# Patient Record
Sex: Female | Born: 1955 | Race: White | Hispanic: No | Smoking: Former smoker
Health system: Southern US, Community
[De-identification: ages and names within clinical notes are randomized; demographics above are authoritative.]

## PROBLEM LIST (undated history)

## (undated) DIAGNOSIS — R569 Unspecified convulsions: Secondary | ICD-10-CM

## (undated) DIAGNOSIS — F329 Major depressive disorder, single episode, unspecified: Secondary | ICD-10-CM

## (undated) DIAGNOSIS — F419 Anxiety disorder, unspecified: Secondary | ICD-10-CM

## (undated) DIAGNOSIS — M329 Systemic lupus erythematosus, unspecified: Secondary | ICD-10-CM

## (undated) DIAGNOSIS — F429 Obsessive-compulsive disorder, unspecified: Secondary | ICD-10-CM

## (undated) DIAGNOSIS — I2510 Atherosclerotic heart disease of native coronary artery without angina pectoris: Secondary | ICD-10-CM

## (undated) DIAGNOSIS — F4310 Post-traumatic stress disorder, unspecified: Secondary | ICD-10-CM

## (undated) DIAGNOSIS — F32A Depression, unspecified: Secondary | ICD-10-CM

## (undated) DIAGNOSIS — B1920 Unspecified viral hepatitis C without hepatic coma: Secondary | ICD-10-CM

## (undated) DIAGNOSIS — IMO0002 Reserved for concepts with insufficient information to code with codable children: Secondary | ICD-10-CM

## (undated) DIAGNOSIS — J45909 Unspecified asthma, uncomplicated: Secondary | ICD-10-CM

## (undated) DIAGNOSIS — F1910 Other psychoactive substance abuse, uncomplicated: Secondary | ICD-10-CM

## (undated) HISTORY — PX: TUBAL LIGATION: SHX77

## (undated) HISTORY — DX: Obsessive-compulsive disorder, unspecified: F42.9

## (undated) HISTORY — DX: Depression, unspecified: F32.A

## (undated) HISTORY — PX: TONSILLECTOMY: SUR1361

## (undated) HISTORY — DX: Anxiety disorder, unspecified: F41.9

## (undated) HISTORY — DX: Post-traumatic stress disorder, unspecified: F43.10

## (undated) HISTORY — PX: CHOLECYSTECTOMY: SHX55

## (undated) HISTORY — DX: Unspecified convulsions: R56.9

## (undated) HISTORY — DX: Major depressive disorder, single episode, unspecified: F32.9

## (undated) HISTORY — PX: OTHER SURGICAL HISTORY: SHX169

## (undated) HISTORY — PX: CARDIAC CATHETERIZATION: SHX172

## (undated) HISTORY — PX: APPENDECTOMY: SHX54

---

## 2001-05-21 ENCOUNTER — Emergency Department (HOSPITAL_COMMUNITY): Admission: EM | Admit: 2001-05-21 | Discharge: 2001-05-21 | Payer: Self-pay | Admitting: Emergency Medicine

## 2001-07-12 ENCOUNTER — Encounter (INDEPENDENT_AMBULATORY_CARE_PROVIDER_SITE_OTHER): Payer: Self-pay | Admitting: Internal Medicine

## 2001-07-12 ENCOUNTER — Ambulatory Visit (HOSPITAL_COMMUNITY): Admission: RE | Admit: 2001-07-12 | Discharge: 2001-07-12 | Payer: Self-pay | Admitting: Internal Medicine

## 2001-08-16 ENCOUNTER — Ambulatory Visit (HOSPITAL_COMMUNITY): Admission: RE | Admit: 2001-08-16 | Discharge: 2001-08-16 | Payer: Self-pay | Admitting: Internal Medicine

## 2001-10-31 ENCOUNTER — Emergency Department (HOSPITAL_COMMUNITY): Admission: EM | Admit: 2001-10-31 | Discharge: 2001-10-31 | Payer: Self-pay | Admitting: *Deleted

## 2001-10-31 ENCOUNTER — Encounter: Payer: Self-pay | Admitting: *Deleted

## 2001-11-25 ENCOUNTER — Ambulatory Visit (HOSPITAL_COMMUNITY): Admission: RE | Admit: 2001-11-25 | Discharge: 2001-11-25 | Payer: Self-pay | Admitting: Unknown Physician Specialty

## 2001-11-25 ENCOUNTER — Encounter: Payer: Self-pay | Admitting: Unknown Physician Specialty

## 2002-02-04 ENCOUNTER — Emergency Department (HOSPITAL_COMMUNITY): Admission: EM | Admit: 2002-02-04 | Discharge: 2002-02-04 | Payer: Self-pay | Admitting: Emergency Medicine

## 2002-03-18 ENCOUNTER — Encounter: Admission: RE | Admit: 2002-03-18 | Discharge: 2002-03-18 | Payer: Self-pay | Admitting: Neurosurgery

## 2002-03-18 ENCOUNTER — Encounter: Payer: Self-pay | Admitting: Neurosurgery

## 2002-04-01 ENCOUNTER — Encounter: Admission: RE | Admit: 2002-04-01 | Discharge: 2002-04-01 | Payer: Self-pay | Admitting: Neurosurgery

## 2002-04-01 ENCOUNTER — Encounter: Payer: Self-pay | Admitting: Neurosurgery

## 2002-04-15 ENCOUNTER — Encounter: Payer: Self-pay | Admitting: Neurosurgery

## 2002-04-15 ENCOUNTER — Encounter: Admission: RE | Admit: 2002-04-15 | Discharge: 2002-04-15 | Payer: Self-pay | Admitting: Neurosurgery

## 2002-04-16 ENCOUNTER — Emergency Department (HOSPITAL_COMMUNITY): Admission: EM | Admit: 2002-04-16 | Discharge: 2002-04-16 | Payer: Self-pay | Admitting: *Deleted

## 2002-06-02 ENCOUNTER — Encounter: Admission: RE | Admit: 2002-06-02 | Discharge: 2002-06-02 | Payer: Self-pay | Admitting: Neurosurgery

## 2002-06-02 ENCOUNTER — Encounter: Payer: Self-pay | Admitting: Neurosurgery

## 2002-06-27 ENCOUNTER — Emergency Department (HOSPITAL_COMMUNITY): Admission: EM | Admit: 2002-06-27 | Discharge: 2002-06-27 | Payer: Self-pay | Admitting: Emergency Medicine

## 2002-07-09 ENCOUNTER — Ambulatory Visit (HOSPITAL_COMMUNITY): Admission: RE | Admit: 2002-07-09 | Discharge: 2002-07-09 | Payer: Self-pay | Admitting: Internal Medicine

## 2002-07-09 ENCOUNTER — Encounter: Payer: Self-pay | Admitting: Internal Medicine

## 2002-07-14 ENCOUNTER — Encounter: Payer: Self-pay | Admitting: Neurosurgery

## 2002-07-16 ENCOUNTER — Ambulatory Visit (HOSPITAL_COMMUNITY): Admission: RE | Admit: 2002-07-16 | Discharge: 2002-07-16 | Payer: Self-pay | Admitting: Neurosurgery

## 2002-08-01 ENCOUNTER — Encounter: Payer: Self-pay | Admitting: Neurosurgery

## 2002-08-01 ENCOUNTER — Inpatient Hospital Stay (HOSPITAL_COMMUNITY): Admission: RE | Admit: 2002-08-01 | Discharge: 2002-08-04 | Payer: Self-pay | Admitting: Neurosurgery

## 2002-12-31 ENCOUNTER — Ambulatory Visit (HOSPITAL_COMMUNITY): Admission: RE | Admit: 2002-12-31 | Discharge: 2002-12-31 | Payer: Self-pay | Admitting: General Surgery

## 2002-12-31 ENCOUNTER — Encounter: Payer: Self-pay | Admitting: General Surgery

## 2003-01-05 ENCOUNTER — Emergency Department (HOSPITAL_COMMUNITY): Admission: EM | Admit: 2003-01-05 | Discharge: 2003-01-05 | Payer: Self-pay | Admitting: Emergency Medicine

## 2003-02-12 ENCOUNTER — Encounter (HOSPITAL_COMMUNITY): Admission: RE | Admit: 2003-02-12 | Discharge: 2003-03-14 | Payer: Self-pay | Admitting: Rheumatology

## 2003-08-23 ENCOUNTER — Emergency Department (HOSPITAL_COMMUNITY): Admission: EM | Admit: 2003-08-23 | Discharge: 2003-08-23 | Payer: Self-pay | Admitting: Emergency Medicine

## 2003-08-25 ENCOUNTER — Emergency Department (HOSPITAL_COMMUNITY): Admission: EM | Admit: 2003-08-25 | Discharge: 2003-08-26 | Payer: Self-pay | Admitting: Emergency Medicine

## 2003-12-23 ENCOUNTER — Ambulatory Visit (HOSPITAL_COMMUNITY): Admission: RE | Admit: 2003-12-23 | Discharge: 2003-12-23 | Payer: Self-pay | Admitting: Family Medicine

## 2003-12-25 ENCOUNTER — Ambulatory Visit (HOSPITAL_COMMUNITY): Admission: RE | Admit: 2003-12-25 | Discharge: 2003-12-25 | Payer: Self-pay | Admitting: Family Medicine

## 2003-12-28 ENCOUNTER — Ambulatory Visit (HOSPITAL_COMMUNITY): Admission: RE | Admit: 2003-12-28 | Discharge: 2003-12-28 | Payer: Self-pay | Admitting: Family Medicine

## 2004-01-19 ENCOUNTER — Ambulatory Visit (HOSPITAL_COMMUNITY): Admission: RE | Admit: 2004-01-19 | Discharge: 2004-01-19 | Payer: Self-pay | Admitting: Family Medicine

## 2004-02-12 ENCOUNTER — Ambulatory Visit (HOSPITAL_COMMUNITY): Admission: RE | Admit: 2004-02-12 | Discharge: 2004-02-12 | Payer: Self-pay | Admitting: Internal Medicine

## 2004-06-17 ENCOUNTER — Ambulatory Visit (HOSPITAL_COMMUNITY): Admission: RE | Admit: 2004-06-17 | Discharge: 2004-06-17 | Payer: Self-pay | Admitting: Internal Medicine

## 2004-07-14 ENCOUNTER — Ambulatory Visit: Payer: Self-pay | Admitting: Gastroenterology

## 2004-08-15 ENCOUNTER — Ambulatory Visit: Payer: Self-pay | Admitting: Family Medicine

## 2004-10-19 ENCOUNTER — Ambulatory Visit: Payer: Self-pay | Admitting: Family Medicine

## 2005-09-18 ENCOUNTER — Ambulatory Visit: Payer: Self-pay | Admitting: Family Medicine

## 2005-09-29 ENCOUNTER — Ambulatory Visit (HOSPITAL_COMMUNITY): Admission: RE | Admit: 2005-09-29 | Discharge: 2005-09-29 | Payer: Self-pay | Admitting: Neurosurgery

## 2005-09-29 ENCOUNTER — Ambulatory Visit (HOSPITAL_COMMUNITY): Admission: RE | Admit: 2005-09-29 | Discharge: 2005-09-29 | Payer: Self-pay | Admitting: Family Medicine

## 2007-09-12 ENCOUNTER — Encounter: Payer: Self-pay | Admitting: Family Medicine

## 2008-03-26 DIAGNOSIS — K219 Gastro-esophageal reflux disease without esophagitis: Secondary | ICD-10-CM

## 2008-03-26 DIAGNOSIS — G4700 Insomnia, unspecified: Secondary | ICD-10-CM

## 2008-03-26 DIAGNOSIS — F329 Major depressive disorder, single episode, unspecified: Secondary | ICD-10-CM | POA: Insufficient documentation

## 2008-03-26 DIAGNOSIS — B1710 Acute hepatitis C without hepatic coma: Secondary | ICD-10-CM | POA: Insufficient documentation

## 2008-03-26 DIAGNOSIS — F3289 Other specified depressive episodes: Secondary | ICD-10-CM | POA: Insufficient documentation

## 2008-03-26 DIAGNOSIS — J301 Allergic rhinitis due to pollen: Secondary | ICD-10-CM

## 2008-03-26 DIAGNOSIS — M25519 Pain in unspecified shoulder: Secondary | ICD-10-CM | POA: Insufficient documentation

## 2008-03-26 DIAGNOSIS — F17200 Nicotine dependence, unspecified, uncomplicated: Secondary | ICD-10-CM

## 2008-12-21 ENCOUNTER — Telehealth: Payer: Self-pay | Admitting: Family Medicine

## 2009-01-13 ENCOUNTER — Encounter (INDEPENDENT_AMBULATORY_CARE_PROVIDER_SITE_OTHER): Payer: Self-pay | Admitting: General Surgery

## 2009-01-13 ENCOUNTER — Ambulatory Visit (HOSPITAL_COMMUNITY): Admission: RE | Admit: 2009-01-13 | Discharge: 2009-01-13 | Payer: Self-pay | Admitting: General Surgery

## 2010-02-21 ENCOUNTER — Ambulatory Visit: Payer: Self-pay | Admitting: Cardiology

## 2010-02-21 ENCOUNTER — Inpatient Hospital Stay (HOSPITAL_COMMUNITY): Admission: AD | Admit: 2010-02-21 | Discharge: 2010-02-22 | Payer: Self-pay | Admitting: Internal Medicine

## 2010-03-08 ENCOUNTER — Telehealth: Payer: Self-pay | Admitting: Internal Medicine

## 2010-10-01 ENCOUNTER — Encounter: Payer: Self-pay | Admitting: Family Medicine

## 2010-10-02 ENCOUNTER — Encounter: Payer: Self-pay | Admitting: Family Medicine

## 2010-10-02 ENCOUNTER — Encounter (INDEPENDENT_AMBULATORY_CARE_PROVIDER_SITE_OTHER): Payer: Self-pay | Admitting: Internal Medicine

## 2010-10-11 NOTE — Progress Notes (Signed)
Summary: pt wants to talk to Dr. Jesusita Oka about going to ER  Phone Note Call from Patient Call back at [phone]   Caller: Patient Reason for Call: Talk to Nurse, Talk to Doctor Summary of Call: pt having edema in feet and was told by pcp to go to ER and and she wants to talk to Dr. Jesusita Oka first her b/p today was 175/116 resp 96 Initial call taken by: Omer Jack,  March 08, 2010 12:27 PM  Follow-up for Phone Call        pt c/o feet, legs, arms and hands swelling pretty bad, does not get better elevation, she states she was on benicar/hctz and it was changed to lopressor 25mg  bid in hospital, BP running all over from 139-186/90-100s, right now its 171/85 right now, has appt w/pcp on Friday Meredith Staggers, RN  March 08, 2010 2:45 PM   Additional Follow-up for Phone Call Additional follow up Details #1::        Likely doesn't need to go to ER unless short of breath. Restart benicar/hctz.  give lasix 20 once daily with kcl 20 once daily for 2 days for jump start. Dolores Patty, MD, [facility]  March 08, 2010 3:08 PM  pt is not having SOB and doesn't feel she needs to go to ER, she will restart her benicar/hctz and rx for lasix and pot. called in Meredith Staggers, RN  March 08, 2010 4:51 PM     New/Updated Medications: FUROSEMIDE 20 MG TABS (FUROSEMIDE) Take one tablet by mouth daily for 2 days POTASSIUM CHLORIDE CRYS CR 20 MEQ CR-TABS (POTASSIUM CHLORIDE CRYS CR) Take one tablet by mouth daily for 2 days Prescriptions: POTASSIUM CHLORIDE CRYS CR 20 MEQ CR-TABS (POTASSIUM CHLORIDE CRYS CR) Take one tablet by mouth daily for 2 days  #2 x 0   Entered by:   Meredith Staggers, RN   Authorized by:   Dolores Patty, MD, [facility]   Signed by:   Meredith Staggers, RN on 03/08/2010   Method used:   Electronically to        [facility]* (retail)       [address]       [place], [place]  [address]       Ph: [phone]       Fax: [phone]   RxID:   [id number] FUROSEMIDE 20 MG TABS  (FUROSEMIDE) Take one tablet by mouth daily for 2 days  #2 x 0   Entered by:   Meredith Staggers, RN   Authorized by:   Dolores Patty, MD, [facility]   Signed by:   Meredith Staggers, RN on 03/08/2010   Method used:   Electronically to        [facility]* (retail)       [address]       [place], [place]  [address]       Ph: [phone]       Fax: [phone]   RxID:   [id number]

## 2010-10-11 NOTE — Letter (Signed)
Summary: misc  misc   Imported By: Lind Guest 04/27/2010 14:40:01  _____________________________________________________________________  External Attachment:    Type:   Image     Comment:   External Document

## 2010-10-11 NOTE — Letter (Signed)
Summary: history and physical  history and physical   Imported By: Lind Guest 04/27/2010 14:38:45  _____________________________________________________________________  External Attachment:    Type:   Image     Comment:   External Document

## 2010-10-11 NOTE — Letter (Signed)
Summary: office notes  office notes   Imported By: Lind Guest 04/27/2010 14:40:56  _____________________________________________________________________  External Attachment:    Type:   Image     Comment:   External Document

## 2010-10-11 NOTE — Letter (Signed)
Summary: phone notes  phone notes   Imported By: Lind Guest 04/27/2010 14:45:09  _____________________________________________________________________  External Attachment:    Type:   Image     Comment:   External Document

## 2010-10-11 NOTE — Letter (Signed)
**Note De-Identified  Obfuscation** Summary: labs  labs   Imported By: Lind Guest 04/27/2010 14:39:10  _____________________________________________________________________  External Attachment:    Type:   Image     Comment:   External Document

## 2010-10-11 NOTE — Letter (Signed)
Summary: x rays  x rays   Imported By: Lind Guest 04/27/2010 14:45:37  _____________________________________________________________________  External Attachment:    Type:   Image     Comment:   External Document

## 2010-10-11 NOTE — Letter (Signed)
Summary: Historic Patient File  Historic Patient File   Imported By: Lind Guest 04/27/2010 15:02:55  _____________________________________________________________________  External Attachment:    Type:   Image     Comment:   External Document

## 2010-10-11 NOTE — Letter (Signed)
Summary: office notes  office notes   Imported By: Lind Guest 04/27/2010 14:37:27  _____________________________________________________________________  External Attachment:    Type:   Image     Comment:   External Document

## 2010-10-11 NOTE — Letter (Signed)
Summary: demo  demo   Imported By: Lind Guest 04/27/2010 14:38:13  _____________________________________________________________________  External Attachment:    Type:   Image     Comment:   External Document

## 2010-11-28 LAB — BASIC METABOLIC PANEL
BUN: 9 mg/dL (ref 6–23)
CO2: 30 mEq/L (ref 19–32)
Calcium: 9.2 mg/dL (ref 8.4–10.5)
Chloride: 100 mEq/L (ref 96–112)
Creatinine, Ser: 0.59 mg/dL (ref 0.4–1.2)
GFR calc Af Amer: >60 mL/min (ref >60)
GFR calc non Af Amer: >60 mL/min (ref >60)
Glucose, Bld: 95 mg/dL (ref 70–99)
Potassium: 3.4 mEq/L — ABNORMAL LOW (ref 3.5–5.1)
Sodium: 138 mEq/L (ref 135–145)

## 2010-11-28 LAB — HEPARIN LEVEL (UNFRACTIONATED)
Heparin Unfractionated: 0.14 IU/mL — ABNORMAL LOW (ref 0.30–0.70)
Heparin Unfractionated: <0.1 IU/mL — ABNORMAL LOW (ref 0.30–0.70)

## 2010-11-28 LAB — PROTIME-INR: INR: 1.1 (ref 0.00–1.49)

## 2010-11-28 LAB — MRSA PCR SCREENING: MRSA by PCR: NEGATIVE

## 2010-11-28 LAB — LIPID PANEL: VLDL: 12 mg/dL (ref 0–40)

## 2010-11-28 LAB — CBC
Platelets: 225 10*3/uL (ref 150–400)
WBC: 11.6 10*3/uL — ABNORMAL HIGH (ref 4.0–10.5)

## 2010-12-20 LAB — CBC
HCT: 52.4 % — ABNORMAL HIGH (ref 36.0–46.0)
Hemoglobin: 18.1 g/dL — ABNORMAL HIGH (ref 12.0–15.0)
Platelets: 184 10*3/uL (ref 150–400)
WBC: 7.9 10*3/uL (ref 4.0–10.5)

## 2010-12-20 LAB — BASIC METABOLIC PANEL
GFR calc non Af Amer: >60 mL/min (ref >60)
Potassium: 4 mEq/L (ref 3.5–5.1)
Sodium: 137 mEq/L (ref 135–145)

## 2011-01-24 NOTE — H&P (Signed)
Donna [name], [name]               ACCOUNT NO.:  [id number]   MEDICAL RECORD NO.:  [id number]          PATIENT TYPE:  AMB   LOCATION:  DAY                           FACILITY:  [facility]   PHYSICIAN:  Tilford Pillar, MD      DATE OF BIRTH:  [birth year]   DATE OF ADMISSION:  DATE OF DISCHARGE:  LH                              HISTORY & PHYSICAL   CHIEF COMPLAINT:  Rectal growth.   HISTORY OF PRESENT ILLNESS:  The patient is a 55 year old female who  states approximately 5 years ago she started noticing a growth in the  rectal area which was also spread to the vaginal area.  She described  some as small nodules.  She has been in the past been able to scratch  these with her finger until they were removed, at times that would cause  bleeding.  Otherwise, there has been no intermittent bleeding.  They  have continued to become more numerous.  She has had no other  difficulties.  She does state that the largest area thus occasionally  become painful, the others are typically asymptomatic.  She has had no  change in bowel movements.  No melena.  No hematochezia.  She has had  occasional fevers and chills.  No significant weight changes.  In  addition, the patient was also noted a discolored area on her right  lower lip and along the vermilion border.  This has been present for  approximately same number of years.  The patient does admit to having  not been seen in followup by her primary care physician over the last 5-  6 years after being discharged from Dr. Anthony Sar care.  At that time,  she has had not reestablished any additional or new primary care  physician.   PAST MEDICAL HISTORY:  1. Asthma.  2. Bronchitis.  3. History of lupus.  4. History of hepatitis C.  5. Gastroenteritis.   PAST SURGICAL HISTORY:  Appendectomy, history of cystectomy, and has had  evidence of vocal cord polyp excised.   CURRENT MEDICATIONS:  Nystatin.  She is not on any anticoagulation.   MEDICATION  ALLERGIES:  CODEINE, PENICILLIN, SULFA.  The patient also  admits to dietary allergies of DIARY, CORN, WHEAT and CERTAIN DYES.   SOCIAL HISTORY:  She is a one-pack per day smoker.  She does frequently  drink alcohol.  She denies any recreational drug use.  Occupation, the  patient is disabled.  She said 2 prior pregnancies.   PERTINENT FAMILY HISTORY:  Diabetes and coronary artery disease as well  as a significant cancer history in the family.   REVIEW OF SYSTEMS:  CONSTITUTIONAL:  Fevers and chills, headaches.  EYES:  Blurred vision, diplopia, and pain.  EARS, NOSE, AND THROAT:  Occasional rhinorrhea.  RESPIRATORY:  Cough, shortness of breath,  wheezing, chest.  CARDIOVASCULAR:  Intermittent chest pain.  GASTROINTESTINAL:  Abdominal pain, nausea, bowel changes including some  occasional diarrhea and with intermittent constipation.  GENITOURINARY:  Increased frequency.  MUSCULOSKELETAL:  Arthralgias of the joints and  back.  SKIN:  Unremarkable.  ENDOCRINE:  Lack of energy and excessive  thirst.  NEUROLOGIC:  Paresthesias especially of the right leg and  second toe.  She occasionally has tremors.   PHYSICAL EXAMINATION:  GENERAL:  The patient is an anxious-appearing  female.  She is somewhat disheveled on her appearance.  However, she is  alert and oriented.  HEENT:  Scalp, no deformities.  Hair is thin.  No masses.  Eyes, pupils  equal and round.  Extraocular movements are intact.  No conjunctival  pallor is noted.  Oral mucosa is pink.  She has a normal occlusion.  She  does have an extremely poor dentition.  She additionally has a  violaceous lesion on the right lower lip near the vermilion border, this  is nontender to palpation.  No nodularity is noted.  No blanching is  appreciated.  NECK:  Trachea is midline.  Thyroid appears normal.  No nodularity or  goiter.  She does have a moderately enlarged lymph node along the left  lateral neck.  PULMONARY:  Unlabored respiration.   She does have expiratory wheezing.  She has coarse crackles on deep inspiration.  She has full breath sounds  bilaterally.  CARDIOVASCULAR:  Regular rate.  No murmurs are apparent in the office  today.  She has 2+ radial, femoral, and dorsalis pedis pulses  bilaterally.  ABDOMEN:  Positive bowel sounds.  Abdomen is soft and nontender.  EXTERNAL GENITALIA:  She does have some multiple nodular lesions around  the perianal area as well as a lesion on the vagina along the genital  labyrinth.  She does have a hemorrhoid.  This is nontender.  SKIN:  Warm and dry.   ASSESSMENT AND PLAN:  1. Right lower lip lesion.  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  In regards to the      right lower lip lesion, I do have a high suspicion based on the      location and discoloration of that, possible cancerous change      including melanoma as of possibility, although she does state this      has been present for several years.  Now without additional      symptomatology or complications, it is slightly lower although      based on its appearance and its location, I would recommend      proceeding relatively urgently for a biopsy of this area.  This      will be performed in the operating room for control and comfort.      Risks, benefits, and alternatives of biopsy were discussed with the      patient.  This will be an incisional rather than a true excisional      biopsy due to the size and location.  Positive findings consistent      with melanoma, would require referral to an ears, nose, and throat      specialist due to the size and location and likely difficulties      with wide excision.  This we will further be discussed on the      postoperative visit with the patient.  In regards to the terminal      illness changes, at this time I did discuss the findings with the      patient and did explain to the patient, that this can be      transmitted sexually and thus any sexual course should  involve      contact protection including condoms.  At this time, I feel due to      the moderate amount of area that ablation will be necessary,      although at this time I think I did discuss with the patient      recommendations to start with silver nitrate ablation to evaluate      her response in the office, should it more aggressive      electrocautery      ablation or laser ablation be required that would be performed in      the operating room.  She understands this, but at this time we will      plan to proceed with the biopsy of the lip lesion prior to      performing any treatment on the genital or perianal areas.       Tilford Pillar, MD  Electronically Signed     BZ/MEDQ  D:  01/01/2009  T:  01/02/2009  Job:  [id number]   cc:   [facility]   Short-Stay Surgery

## 2011-01-24 NOTE — H&P (Signed)
NAMEBETSABE, IGLESIA               ACCOUNT NO.:  [id number]   MEDICAL RECORD NO.:  [id number]          PATIENT TYPE:  AMB   LOCATION:  DAY                           FACILITY:  [facility]   PHYSICIAN:  Tilford Pillar, MD      DATE OF BIRTH:  1956-02-23   DATE OF ADMISSION:  DATE OF DISCHARGE:  LH                              HISTORY & PHYSICAL   CHIEF COMPLAINT:  Rectal growth.   HISTORY OF PRESENT ILLNESS:  The patient is a 55 year old female who  states approximately 5 years ago she started noticing a growth in the  rectal area which was also spread to the vaginal area.  She described  some as small nodules.  She has been in the past been able to scratch  these with her finger until they were removed, at times that would cause  bleeding.  Otherwise, there has been no intermittent bleeding.  They  have continued to become more numerous.  She has had no other  difficulties.  She does state that the largest area thus occasionally  become painful, the others are typically asymptomatic.  She has had no  change in bowel movements.  No melena.  No hematochezia.  She has had  occasional fevers and chills.  No significant weight changes.  In  addition, the patient was also noted a discolored area on her right  lower lip and along the vermilion border.  This has been present for  approximately same number of years.  The patient does admit to having  not been seen in followup by her primary care physician over the last 5-  6 years after being discharged from Dr. Anthony Sar care.  At that time,  she has had not reestablished any additional or new primary care  physician.   PAST MEDICAL HISTORY:  1. Asthma.  2. Bronchitis.  3. History of lupus.  4. History of hepatitis C.  5. Gastroenteritis.   PAST SURGICAL HISTORY:  Appendectomy, history of cystectomy, and has had  evidence of vocal cord polyp excised.   CURRENT MEDICATIONS:  Nystatin.  She is not on any anticoagulation.   MEDICATION  ALLERGIES:  CODEINE, PENICILLIN, SULFA.  The patient also  admits to dietary allergies of DIARY, CORN, WHEAT and CERTAIN DYES.   SOCIAL HISTORY:  She is a one-pack per day smoker.  She does frequently  drink alcohol.  She denies any recreational drug use.  Occupation, the  patient is disabled.  She said 2 prior pregnancies.   PERTINENT FAMILY HISTORY:  Diabetes and coronary artery disease as well  as a significant cancer history in the family.   REVIEW OF SYSTEMS:  CONSTITUTIONAL:  Fevers and chills, headaches.  EYES:  Blurred vision, diplopia, and pain.  EARS, NOSE, AND THROAT:  Occasional rhinorrhea.  RESPIRATORY:  Cough, shortness of breath,  wheezing, chest.  CARDIOVASCULAR:  Intermittent chest pain.  GASTROINTESTINAL:  Abdominal pain, nausea, bowel changes including some  occasional diarrhea and with intermittent constipation.  GENITOURINARY:  Increased frequency.  MUSCULOSKELETAL:  Arthralgias of the joints and  back.  SKIN:  Unremarkable.  ENDOCRINE:  Lack of energy and excessive  thirst.  NEUROLOGIC:  Paresthesias especially of the right leg and  second toe.  She occasionally has tremors.   PHYSICAL EXAMINATION:  GENERAL:  The patient is an anxious-appearing  female.  She is somewhat disheveled on her appearance.  However, she is  alert and oriented.  HEENT:  Scalp, no deformities.  Hair is thin.  No masses.  Eyes, pupils  equal and round.  Extraocular movements are intact.  No conjunctival  pallor is noted.  Oral mucosa is pink.  She has a normal occlusion.  She  does have an extremely poor dentition.  She additionally has a  violaceous lesion on the right lower lip near the vermilion border, this  is nontender to palpation.  No nodularity is noted.  No blanching is  appreciated.  NECK:  Trachea is midline.  Thyroid appears normal.  No nodularity or  goiter.  She does have a moderately enlarged lymph node along the left  lateral neck.  PULMONARY:  Unlabored respiration.   She does have expiratory wheezing.  She has coarse crackles on deep inspiration.  She has full breath sounds  bilaterally.  CARDIOVASCULAR:  Regular rate.  No murmurs are apparent in the office  today.  She has 2+ radial, femoral, and dorsalis pedis pulses  bilaterally.  ABDOMEN:  Positive bowel sounds.  Abdomen is soft and nontender.  EXTERNAL GENITALIA:  She does have some multiple nodular lesions around  the perianal area as well as a lesion on the vagina along the genital  labyrinth.  She does have a hemorrhoid.  This is nontender.  SKIN:  Warm and dry.   ASSESSMENT AND PLAN:  1. Right lower lip lesion.  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  In regards to the      right lower lip lesion, I do have a high suspicion based on the      location and discoloration of that, possible cancerous change      including melanoma as of possibility, although she does state this      has been present for several years.  Now without additional      symptomatology or complications, it is slightly lower although      based on its appearance and its location, I would recommend      proceeding relatively urgently for a biopsy of this area.  This      will be performed in the operating room for control and comfort.      Risks, benefits, and alternatives of biopsy were discussed with the      patient.  This will be an incisional rather than a true excisional      biopsy due to the size and location.  Positive findings consistent      with melanoma, would require referral to an ears, nose, and throat      specialist due to the size and location and likely difficulties      with wide excision.  This we will further be discussed on the      postoperative visit with the patient.  In regards to the terminal      illness changes, at this time I did discuss the findings with the      patient and did explain to the patient, that this can be      transmitted sexually and thus any sexual course should  involve      contact protection including condoms.  At this time, I feel due to      the moderate amount of area that ablation will be necessary,      although at this time I think I did discuss with the patient      recommendations to start with silver nitrate ablation to evaluate      her response in the office, should it more aggressive      electrocautery      ablation or laser ablation be required that would be performed in      the operating room.  She understands this, but at this time we will      plan to proceed with the biopsy of the lip lesion prior to      performing any treatment on the genital or perianal areas.       Tilford Pillar, MD  Electronically Signed     BZ/MEDQ  D:  01/01/2009  T:  01/02/2009  Job:  [id number]   cc:   [facility]   Short-Stay Surgery

## 2011-01-24 NOTE — Op Note (Signed)
NAMECAELIN, ROSEN               ACCOUNT NO.:  [id number]   MEDICAL RECORD NO.:  [id number]          PATIENT TYPE:  AMB   LOCATION:  DAY                           FACILITY:  [facility]   PHYSICIAN:  Tilford Pillar, MD      DATE OF BIRTH:  1956/03/24   DATE OF PROCEDURE:  01/13/2009  DATE OF DISCHARGE:                               OPERATIVE REPORT   PREOPERATIVE DIAGNOSIS:  Right lower lip lesion.   POSTOPERATIVE DIAGNOSIS:  Right lower lip lesion.   PROCEDURE:  Excision of the right lower lip lesion with a 1-cm incision.   SURGEON:  Tilford Pillar, MD   ANESTHESIA:  General endotracheal, local anesthetic 1% Sensorcaine  plain.   SPECIMEN:  Right lower lip lesion.   ESTIMATED BLOOD LOSS:  Minimal.   INDICATIONS:  The patient is a 55 year old female who presented to my  office as an outpatient for several surgical complications; however,  during her evaluation, I did discuss with the patient the finding in the  lip lesion.  It had been advised to the patient several years ago to  have it excised by another physician but the patient had opted not to,  since that time it has increased in size and due to its location and its  consistency, it is somewhat worrisome for malignant changes.  I did  discuss with the patient the risks, benefits, and alternatives of an  excision of this with our understanding that should it come back as a  malignancy, referral to ear, nose, and throat with  plastic surgery  would be required to assure adequate margins due to its location near  the right canthus of the mouth.  She does understand this and does wish  to proceed.  Understanding the risk of bleeding and infection at this  time, the patient is consented for a planned procedure.   OPERATION:  The patient was taken to the operating room, she was placed  in the supine position on the operating room table, at which time, the  general anesthetic was administered.  Once the patient was asleep, she  was  endotracheally intubated by Anesthesia.  At this time, her perioral  region was prepped with Betadine solution.  Local anesthetic was  instilled, and at this time, I did create a triangular incision.  I  excised the left lower lesion off the lip.  This was taken down past the  vermilion border and at this time, hemostasis was obtained using the  electrocautery.  The lesion was actually excised in total and was sent  as a permanent specimen to Pathology.  I did use a 4-0 Vicryl suture to  reapproximate the muscular layer of the lower lip.  The intraoral mucosa  was reapproximated using a 4-0 chromic suture in simple interrupted  fashion and the external portions of the closure was carried out using a  4-0 Vicryl, the deeper tissue using a 4-0 nylon suture in simple  interrupted fashion x2 for the skin just inferior to the vermilion  border.  At this time, I was quite pleased with the reapproximation.  Dyssymmetry was minimal between the lower aspects of the lip.  Oral  closure was superb, and at this time, I __________ to conclude the  procedure.  At this time, the drapes were removed.  The area was  irrigated and cleaned and some antibiotic ointment was placed  over the incision.  The patient was allowed to come out of general  anesthetic, was extubated, and was transferred back to regular hospital  bed and transferred to postanesthetic care unit in stable condition.  At  the conclusion of the procedure, all instruments and needle counts were  correct.  The patient tolerated the procedure extremely well.      Tilford Pillar, MD  Electronically Signed     BZ/MEDQ  D:  01/13/2009  T:  01/14/2009  Job:  [id number]   cc:   Dr. Sherryll Burger.

## 2011-01-27 NOTE — Op Note (Signed)
NAME:  Donna [name], Donna [name]                         ACCOUNT NO.:  [id number]   MEDICAL RECORD NO.:  [id number]                   PATIENT TYPE:  AMB   LOCATION:  DAY                                  FACILITY:  [facility]   PHYSICIAN:  Lionel December, M.D.                 DATE OF BIRTH:  [birth year]   DATE OF PROCEDURE:  02/12/2004  DATE OF DISCHARGE:                                 OPERATIVE REPORT   PROCEDURE:  ERCP with endoscopic sphincterotomy and biliary stent placement.   INDICATIONS FOR PROCEDURE:  Donna [name] is a 55 year old Caucasian female with  recurrent right upper quadrant pain who was noted to have mildly dilated CBD  and CHD measuring about 10 mm.  No stones were found in her gallbladder.  She subsequently had MRCP which showed a mildly dilated biliary system and a  prominent duct and a filling defect in the distal-most segment of the CBD,  possibly a stone but could be a small tumor.  She is undergoing ERCP for  therapeutic intension.  The procedure risks were reviewed with the patient  and informed consent was obtained.   PREOPERATIVE MEDICATIONS:  Please see anesthesia records for details.   FINDINGS:  The procedure was performed in the OR.  After the patient was  placed under anesthesia and intubated, she was positioned in the semiprone  position.  The Olympus videoduodenoscope was passed via the oropharynx into  the esophagus and stomach.  She had a moderate amount of food debris, but I  was able to find the pylorus.  The scope was advanced to the bulb and  descending duodenum.  The ampulla was small.  The orifice was located under  folds.  It was cannulated with the RX44 orotome and __________ Al Pimple.  The  wire appeared to be in the biliary system.  However, as the dilute contrast  was injected, it extravasated.  Since the ampullary orifice was felt to be  rather small, a small sphincterotomy was performed.  The guidewire was  pulled back, and cannulated was attempted in a  slightly different direction  a little to the left.  This time I was able to cannulate the bile duct.  The  catheter was advanced into the common hepatic duct, and dilute contrast was  spilled.  The CHD and CBD were mildly dilated, measuring maybe 9 to 10 mm.  The intrahepatic biliary radicles appeared to be unremarkable.  The distal-  most segment of the bile duct did not fill.  Since there had been a prior  extravasation, I did not want to inject too much contrast and distend the  system.  Therefore, I elected to place the stent.  The sphincterotomy was  extended a little bit, but it still was a small sphincterotomy.  A 10 French  7-cm plastic stent was placed in the bile duct under direct and fluoroscopic  control.  The inner catheter and  guidewire was pulled out.  The stent was in  good position.  The endoscope was withdrawn.   The patient was extubated and brought to the PACU in stable condition.   FINAL DIAGNOSES:  1. Normal-appearing ampullary orifice, partly hidden under mucosal folds.  2. Initial injection of contrast resulted in extravasation which was around     the bile duct or in the bile duct axis.  3. The biliary system was subsequently cannulated.  Mildly dilated common     hepatic duct and common bile duct but distal segment could not be filled.  4. Sphincterotomy performed and 10 French 7-cm long stent placed.   PLAN:  1. She was given a single dose of Cipro 400 mg IV and Flagyl 500 mg IV in     the PACU.  2. Plain film of the abdomen will be obtained to make sure she does not have     a pneumoperitoneum.  3. She will be returning for repeat ERCP within the next few weeks.      ___________________________________________                                            Lionel December, M.D.   NR/MEDQ  D:  02/12/2004  T:  02/12/2004  Job:  [id number]   cc:   Milus Mallick. Lodema Hong, M.D.  [address]  [place], [place] [address]  Fax: [phone]

## 2011-01-27 NOTE — H&P (Signed)
NAME:  Donna [name], Donna [name]               ACCOUNT NO.:  [id number]   MEDICAL RECORD NO.:  [id number]          PATIENT TYPE:  OUT   LOCATION:  RAD                           FACILITY:  [facility]   PHYSICIAN:  Donna [name], Dr.     DATE OF BIRTH:  Sep 17, 1955   DATE OF ADMISSION:  06/08/2004  DATE OF DISCHARGE:  LH                                HISTORY & PHYSICAL   CHIEF COMPLAINT:  Followup for stent removal.   HISTORY OF PRESENT ILLNESS:  Donna [name] is a 55 year old lady, well-known to our  practice, with a history of hepatitis C, status post eradication with  antiviral therapy, who presents today to scheduled ERCP with stent removal.  She has been very noncompliant, and has not been to our office on many  occasions, and missed appointments.  She had an ERCP with biliary  sphincterotomy and biliary stent placement on February 12, 2004, by Dr. Lionel [name].  This was done, given history of recurrent right upper quadrant pain  with mildly-dilated common bile duct and common hepatic duct measuring about  10 mm.  No stones were found in her gallbladder.  She had a subsequent MRCP  which showed a mildly-dilated biliary system and a prominent duct, and a  filling defect in the distal most segment of the common bile duct, possibly  a stone, but could be a small tumor.  ERCP revealed a normal-appearing  ampullary orifice, a mildly-dilated common hepatic duct and common bile  duct.  The distal segment could not be filled.  A sphincterotomy was  performed, and 10-French, 7 cm, long stent was placed.   The patient presents today stating that she has been doing fairly well.  She  denies any abdominal pain.  The patient says that over several weeks, up  until a couple of weeks ago, she had had some nausea and vomiting, and would  actually vomit food from the day before.  On MRCP, she was noted to have a  moderate amount of food debris seen in the stomach.  She has recently had  some constipation and is taking  stool softeners with good relief.  She has  acid reflux disease with symptoms well-controlled on Prevacid.   CURRENT MEDICATIONS:  Unavailable.  The patient states that she will call us  with her current medications.   ALLERGIES:  PENICILLIN AND CODEINE.   PAST MEDICAL HISTORY:  History of hepatitis C, status post antiviral therapy  with eradication of the virus in 2003.  She is due for repeat HCV RNA.  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  She is immune to hepatitis A and B based on  serologies.  History of asthma since childhood.  History of chronic insomnia  and previously on lithium, but she has discontinued this.  History of  alcohol abuse, but denies any frequent use at this time.  History of discoid  lupus and gastroesophageal reflux disease.  Severe depression, followed by  [facility].  She had back surgery by Dr. Channing [name]  with lumbar  diskectomy and BAK cage procedure.   FAMILY HISTORY:  Mother died of lung disease at age 3.  The patient reports  that her mother also had colon cancer diagnosed at age 61.  She had a  brother who committed suicide at age 40. He also had disabling lupus and  hepatitis C.   SOCIAL HISTORY:  She is single.  She had been married twice previously.  She  has a son.  She is disabled.  She recently was in jail.  She currently  smokes one pack of cigarettes daily.  She consumed approximately one beer  monthly.  She has a prior history drug abuse.   REVIEW OF SYSTEMS:  Please see HPI for GI.  CARDIOPULMONARY:  Denies any  chest pain or shortness of breath.   PHYSICAL EXAMINATION:  VITAL SIGNS:  Weight 159, up 5 pounds.  Blood  pressure 122/78, pulse 64.  GENERAL:  A pleasant, well-nourished, well-developed Caucasian female, in no  acute distress.  SKIN:  Warm and dry, no jaundice.  HEENT:  Conjunctivae are pink.  Sclerae are nonicteric.  Oropharyngeal  mucosa moist and pink.   No lesions, erythema or exudate.  No lymphadenopathy  or thyromegaly.  CHEST:  Clear to auscultation.  CARDIAC:  Reveals regular rate and rhythm, normal S1 and S2.  No murmurs,  rubs or gallops.  ABDOMEN:  Positive bowel sounds, soft, nontender, nondistended.  No  organomegaly or masses.  EXTREMITIES:  No edema.   IMPRESSION:  History of biliary dilatation status post endoscopic retrograde  cholangiopancreatography (ERCP) with sphincterotomy and biliary stent placed  on February 12, 2004.  She is due to have a stent removed at this time.  Overall,  she is doing very well. A few weeks ago, she did have some intermittent  vomiting, possibly had some gastroparesis.  She is currently asymptomatic.  Gastroesophageal reflux symptoms are well controlled.  She has a history of  hepatitis C which had been eradicated with antiviral therapy.  She is due  for a followup HCV RNA at this time.   Family history of colorectal cancer at age 8 in her mother.  She needs to  have a colonoscopy.   PLAN:  1.  HCV RNA qualitative test.  2.  CBC.  3.  Bleeding time, PT, PTT, type and cross two units, MET-7, LFT's, lipase.  4.  Would recommend a colonoscopy for family history of colorectal cancer.      However, will bring her back at a later date, and go ahead and proceed      with biliary stent removal at this time.      ________________________________________  Tana Coast, P.A.  ___________________________________________  Donna [name], Dr.    Dennison [name]  D:  06/08/2004  T:  06/08/2004  Job:  [id number]   cc:   Donna [name]. Donna [name], M.D.  [address]  [place], [place] [address]  Fax: [phone]

## 2011-01-27 NOTE — H&P (Signed)
NAME:  Donna [name], Donna [name]                           ACCOUNT NO.:  [id number]   MEDICAL RECORD NO.:  [id number]                  PATIENT TYPE:   LOCATION:                                       FACILITY:   PHYSICIAN:  Lionel December, M.D.                 DATE OF BIRTH:   DATE OF ADMISSION:  DATE OF DISCHARGE:                                HISTORY & PHYSICAL   ADDENDUM:  To dictation done 01/26/2004.  After the patient's office visit  on 01/26/2004 a copy of an MRCP which was completed 12/29/2003 was received  in the office.  Per the findings of the MRCP the patient will be scheduled  for an ERCP under general anesthesia with Dr. Karilyn Cota.  Scheduling date not  available at this time.     _____________________________________  ___________________________________________  Ashok Pall, PA                        Lionel December, M.D.   GC/MEDQ  D:  01/28/2004  T:  01/28/2004  Job:  [id number]

## 2011-01-27 NOTE — Consult Note (Signed)
NAME:  Donna, [name].                        ACCOUNT NO.:  [id number]   MEDICAL RECORD NO.:  [id number]                   PATIENT TYPE:   LOCATION:                                       FACILITY:   PHYSICIAN:  Aundra Dubin, M.D.            DATE OF BIRTH:   DATE OF CONSULTATION:  02/12/2003  DATE OF DISCHARGE:                                   CONSULTATION   CHIEF COMPLAINT:  Pain.   Dear Donna [name]:   Thank you for this consultation.  [name] is a 55 year old white female  who has had a great deal of pain over a number of years.  She is a  recovering alcoholic and has been clean from alcohol and drugs for 2-1/2  years.  She has a history of treated hepatitis C.  She underwent a lumbar  laminectomy with ray cages at L4 and L5 in November 2003.  She continues to  have a significant amount of back pain, which is her worst area.  There is  no radiating pain but this is a deep, aching, throbbing pain.  This keeps  her awake at night and she is not able to sleep.  She takes Xanax and Soma  to try and help with this but is still unable to sleep.   Another problem is her left knee.  She had an MRI of the left knee on December 31, 2002 which showed signs consistent with a meniscus tear involving the  anterior horn of the lateral meniscus.  She limps with walking and this  hurts a great deal.   Concerning other areas of pain she says that her hands, elbows, shoulders,  and feet all have bad pain.  She has decreased grip strength.  There have  been no swollen joints except the ankles on occasion.  Her energy level is  poor.  She has lost no weight and reports gaining about 20 pounds.  She  denies fever, rashes, psoriasis, oral ulcers, alopecia, pleurisy, or  Raynaud's.  She used to have bad headaches but occasionally has mild  headaches at this time.  She has constipation from her medicines.  There has  been no diarrhea, blood, or mucus.  She has shortness of breath because of  her  asthma and prior smoking.  No chest pain.  She has problems with her  memory.   PAST MEDICAL AND SURGICAL HISTORY:  1. Asthmatic bronchitis.  2. Hepatitis C.  She tells me that her viral titers are quite low at this     time.  3. 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  4. History of lupus.  5. Tonsillectomy.  6. Tubal ligation.  7. Appendectomy.   MEDICINES:  1. Allegra-D b.i.d.  2. Tessalon 100 mg p.r.n.  3. Neurontin 600 mg q.i.d.  4. Soma 350 mg q.i.d.  5. Endocet 10/650 q.i.d.  6. Stool softener.  7. Paxil 25 mg  daily.  8. Prevacid 30 mg daily.  9. Rhinocort daily.  10.      Albuterol inhaler.  11.      Azmacort inhaler.  12.      Xanax 0.5 mg q.i.d.   DRUG INTOLERANCES:  PENICILLIN.   FAMILY HISTORY:  Her mother died with lung cancer.  Her father is in poor  health and he has black lung disease.   SOCIAL HISTORY:  She is a [place] native.  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  She is disabled at this time because of  her chronic pain and back problems.  She has a 42 year old son.  She  presently lives alone.  She quit smoking six weeks ago and had smoked  consistently since the age of 78.  No alcohol.   PHYSICAL EXAMINATION:  VITAL SIGNS:  Weight 174 pounds, height 5 feet 2-1/4  inches, blood pressure 112/78, respirations 18.  GENERAL:  She appears older than her stated age.  She is in no distress.  She moves stiffly.  SKIN:  No active rashes.  HEENT:  PERLA/EOMI.  Mouth clear.  Moderately poor dentition.  NECK:  Negative JVD.  No adenopathy.  LUNGS:  No wheeze with slightly quiet sounds.  HEART:  Regular.  No murmur.  ABDOMEN:  Negative HSM with slight tenderness.  MUSCULOSKELETAL:  The hands, wrists, elbows, shoulders, and neck have a good  range of motion; all moved easily and without resistance.  Trigger points at  the elbows, shoulder, neck, occiput, anterior chest, upper paraspinous  muscles, along the paraspinous  muscles, and low back all had slight to mild  discomfort.  Hips:  Good range of motion but the legs moved with stiffness.  The left knee was stiff but had a good range of motion to 135 degrees  flexion.  She had moderate medial joint line tenderness.  The right knee  moved with stiffness but was cool, had no swelling, and was nontender.  The  ankles had slight tenderness but no edema.  The ankles and feet showed no  arthritic swelling and the feet were slightly tender.  NEUROLOGIC:  Strength was 5/5, DTR's were 2+ throughout, negative SLR's.   PROCEDURE:  Left knee injection for internal derangement.  The skin was  marked then cleaned with Betadine and alcohol swabs and cooled with ethyl  chloride and 80 mg of Depo Medrol and 1 mL of 2% lidocaine was injected.   ASSESSMENT AND PLAN:  1. Back pain.  I believe the back continues to be her worst area of     discomfort.  She has already had surgery and is on a significant amount     of pain medicine.  She is reporting today that she feels that she needs     to go to the emergency room for treatment.  I think her pain is at a     level that she needs more specialized help through a pain center.  2. Left knee internal derangement.  She likely has a tear to the meniscus.     This has been injected and she is advised to rest for 24 hours.  If this     injection offers no help, then she needs to be evaluated by an     orthopedist for the possibility of arthroscopy.  3. Insomnia.  She is on several medicines to try to help with this.  4. Long-term smoking.  I  congratulated her several times for quitting     smoking.  5. Hepatitis Dirk Dress, Ms. Allshouse has back pain which is chronically aggravating her  insomnia.  I was quite surprised that she did not have positive trigger  points.  I believe she needs to be treated by a pain center primarily for  the back situation.  Her left knee is a separate issue and hopefully the injection will help but  if not then an orthopedist should evaluate this.  I  will see her back on a p.r.n. basis.  Thank you.                                               Aundra Dubin, M.D.    WWT/MEDQ  D:  02/12/2003  T:  02/12/2003  Job:  [id number]   cc:   Dirk Dress. Katrinka Blazing, M.D.  [address]  [place]  [place] [address]  Fax: [phone]

## 2011-01-27 NOTE — H&P (Signed)
NAMEKYNZLEY, DOWSON NO.:  [id number]   MEDICAL RECORD NO.:  [id number]                   PATIENT TYPE:  INP   LOCATION:  NA                                   FACILITY:  [facility]   PHYSICIAN:  Payton Doughty, M.D.                   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   HISTORY OF PRESENT ILLNESS:  A 55 year old, right-handed, white girl who I  have been seeing since June.  She has had back pain for 10 years.  It has  reached the point of intractability.  She is being treated for hepatitis C.  She tried some epidural steroids, which did not help her much.  Diskography  was strongly concordant positive at L5-S1.  She is now admitted for a lumbar  fusion.  This was going to be done a couple of weeks ago, but because of  another emergency, she had to be deferred until today.   PAST MEDICAL HISTORY:  Remarkable for:  1. Discoid lupus.  2. Psoriasis.  3. Hepatitis C.  4. Chronic bronchitis.   MEDICATIONS:  She was on Interferon.  That is over with now.  1. Allegra D 60 mg twice a day.  2. Benzonatate 100 mg three times a day for asthma.  3. Flonase on a p.r.n. basis.  4. Flexeril 10 mg three times a day.  5. Robaxin 500 mg three times a day.  6. Ultram 50 mg three times a day.  7. Motrin 800 mg three times a day.  8. Azmacort puffer on a p.r.n. basis.  9. Beclomethasone cream for lupus.  10.      Albuterol inhaler on a p.r.n. basis.   ALLERGIES:  She is allergic to PENICILLIN.   PAST SURGICAL HISTORY:  Remarkable for:  1. Appendectomy at 55 years of age.  2. Tonsillectomy.  3. Tubal ligation.  4. Vocal cord polyps.   SOCIAL HISTORY:  She does not smoke or drink at this time.  She is a  recovered drug addict and alcoholic.  She is currently not working.   FAMILY HISTORY:  Her mother is deceased of lung  cancer.  Her dad is 19 and  in poor health with black lung.   REVIEW OF SYMPTOMS:  Remarkable for glasses, tinnitus, sinus problems,  swelling of her hands, feet, and leg when she walks, chronic cough and  shortness of breath, bronchitis, nausea, vomiting, liver disease, arm  weakness, leg weakness, back pain, arm pain, joint pain, skin disease,  difficulty with memory, and food allergies.   PHYSICAL EXAMINATION:  HEENT:  Within normal limits.  NECK:  She has a reasonable range of motion.  CHEST:  Diffuse crackles.  CARDIAC:  Regular rate and rhythm.  ABDOMEN:  Nontender with no hepatosplenomegaly.  EXTREMITIES:  Without clubbing or cyanosis.  GENITOURINARY:  Deferred.  PERIPHERAL PULSES:  Good.  NEUROLOGIC:  She is awake, alert, and oriented.  Her cranial nerves are  intact.  Motor exam demonstrates 5/5 strength throughout the upper and lower  extremities.  There is no current sensory deficit.  Straight leg raise is  negative.   LABORATORY DATA:  MRI shows spondylitic change at L5-S1 with noted changes  in facet arthropathy.  She has positive discography at L5-S1.   CLINICAL IMPRESSION:  Lumbar spondylosis, worse at L5-S1.   PLAN:  Lumbar laminectomy, diskectomy, and posterior lumbar body fusion with  Ray cages at L5-S2.  The risks and benefits of this procedure have been  discussed and she wishes to proceed.                                               Payton Doughty, M.D.    MWR/MEDQ  D:  08/01/2002  T:  08/01/2002  Job:  [id number]

## 2011-01-27 NOTE — Op Note (Signed)
[facility]  Patient:    Donna [name], Donna [name] Visit Number: [id number] MRN: [id number]          Service Type: END Location: DAY Attending Physician:  Malissa Hippo Dictated by:   Lionel December, M.D. Proc. Date: 08/16/01 Admit Date:  08/16/2001 Discharge Date: 08/16/2001   CC:         [facility]  Mr. Kirstie Peri, Health Dept.   Operative Report  PROCEDURE:  Percutaneous liver biopsy.  INDICATION:  Donna [name] is a 55 year old Caucasian female with chronic HCV who is undergoing liver biopsy for staging prior to consideration of antiviral therapy.  Her platelet count, PT, PTT and bleeding time are all within normal limits.  Procedure and risks were reviewed with the patient; informed consent was obtained.  FINDINGS:  Procedure performed in endoscopy suite.  Patient was placed in supine position.  Liver was percussed in midaxillary line and site marked for puncture.  The area was cleaned with Betadine in the usual fashion and isolated using sterile sheet.  Xylocaine 1% was injected in the skin and subcutaneous tissue down to the liver capsule.  Liver sounding was done with trocar of 23-gauge spinal needle.  A small nick was made with a scalpel blade in the skin to facilitate entry of soft tissue Monopty needle.  Two passes were made with this needle and adequate liver tissue was obtained.  Puncture site was covered with Band-Aid and patient was asked to lie on the right side. Patient tolerated the procedure well.  FINAL DIAGNOSIS:  Chronic hepatitis C, status post percutaneous liver biopsy for staging of her disease.  PLAN:  She will be monitored in the day hospital for five to six hours.  If she does well, she will be able to go home later today. Dictated by:   Lionel December, M.D. Attending Physician:  Malissa Hippo DD:  08/16/01 TD:  08/16/01 Job: [id number]

## 2011-01-27 NOTE — Op Note (Signed)
   NAMESHAQUAVIA, Donna [name] NO.:  [id number]   MEDICAL RECORD NO.:  [id number]                   PATIENT TYPE:  INP   LOCATION:  3010                                 FACILITY:  [facility]   PHYSICIAN:  Payton Doughty, M.D.                   DATE OF BIRTH:  07/08/1956   DATE OF PROCEDURE:  08/01/2002  DATE OF DISCHARGE:                                 OPERATIVE REPORT   PREOPERATIVE DIAGNOSIS:  Spondylosis of L5-S1.   POSTOPERATIVE DIAGNOSIS:  Spondylosis of L5-S1.   OPERATION/PROCEDURE:  L5-S1 laminectomy, diskectomy, posterolateral  interbody fusion of the right __________ cages.   SURGEON:  Payton Doughty, M.D.   ANESTHESIA:  General endotracheal anesthesia.  __________   COMPLICATIONS:  None.  __________ .   INDICATIONS:  The patient is a 55 year old lady with severe spondylosis and  positive diskography at L5-S1.   DESCRIPTION OF PROCEDURE:  She was taken to the operating room __________  placed prone on the operating table.  Following shave, prepped and draped in  the usual sterile fashion.  Skin was infiltrated with 1% lidocaine and  1:400,000 epinephrine.  Skin was incised from the mid S1 to the bottom of L4  and the lamina of L5 and S1 were exposed bilaterally in the subperiosteal  plane over the L5-S1 facet joints.  Intraoperative x-ray confirmed correct  level.  The pars articularis lamina and inferior facet of L5 and superior  facet of S1 were removed bilaterally using the high-speed drill and the bone  saved for grafting.  Ligamentum flavum was removed and the L5 and S1 nerve  roots were carefully dissected free bilaterally.  Following complete  decompression of the nerve root, diskectomy was carried out at L5-S1.  Right  side effusion cages are in place while protecting the L5 root and S1 root.  Cages were 12 x 21 mm.  Intraoperative x-ray showed good placement of the  cages.  The wound was irrigated, hemostasis assured.  The cages were  packed  with bone graft harvested from the facet joints and capped.   The fascia was reapproximated with 0 Vicryl in interrupted fashion.  Subcutaneous tissue reapproximated with 0 Vicryl interrupted fashion.  The  skin was closed with 3-0 nylon running subcuticular in a running locked  fashion.  Bacitracin and Telfa dressings were applied and an occlusive Op-  Site and the patient returned to the recovery room in good condition.                                               Payton Doughty, M.D.    MWR/MEDQ  D:  08/01/2002  T:  08/02/2002  Job:  [id number]

## 2011-01-27 NOTE — Op Note (Signed)
NAME:  Donna [name], Donna [name]               ACCOUNT NO.:  [id number]   MEDICAL RECORD NO.:  [id number]          PATIENT TYPE:  AMB   LOCATION:  DAY                           FACILITY:  [facility]   PHYSICIAN:  Lionel December, M.D.    DATE OF BIRTH:  1956-09-01   DATE OF PROCEDURE:  06/17/2004  DATE OF DISCHARGE:                                 OPERATIVE REPORT   PROCEDURE:  Endoscopic retrograde cholangiopancreatography with removal of  biliary stent and extension of sphincterotomy and removal of debris from  bile duct.   INDICATIONS:  Donna [name] is a 54 year old Caucasian female who presented back in  June with right upper quadrant pain and elevated transaminases.  She had  dilated CBD with single filling defect on MRCP consistent with a stone.  She  had ERCP on February 12, 2004.  She had mildly dilated bile duct.  Distal  segments not well-seen.  Suspect that she did have a small stone.  The  procedure was complicated by extravasation of contrast, but she did not have  any injection of consequences.  A small sphincterotomy was placed and a 10  French stent was left in place.  She has not been able to return for repeat  procedure for now.  She is fine, although this morning her temperature was  101.  Her WBC is normal and her exam is also unremarkable. Urinalysis is  clean.  She was given a single dose of Levaquin prior to procedure.  Informed consent for the procedure was obtained from the patient.   PREMEDICATION:  Please see anesthesia records.   FINDINGS:  Procedure performed in OR.  The patient was given deep sedation  with Propofol.  She was placed in semi-prone position.  Vital signs and O2  saturation were monitored during procedure and remained stable.  The Olympus  video duodenoscope was passed via oropharynx into esophagus and stomach and  across the pylorus into bulb and second part of the duodenum.  There were a  few bulbar erosions.  The stent was in place.  It was completely covered  with  debris.  It was caught in a snare and removed without any difficulty.  Endoscope was passed again.  CBD was cannulated without any difficulty.  Dilute contrast was injected.  There appeared to be two small filling  defects in the distal segment.  The CBD was filled all the way down to the  ampulla.  The CBD was about 10 mm distally and CHD was markedly dilated,  about 14-15 mm; however, there were no filling defects.  Intrahepatic  biliary radicles were normal.  Sphincterotomy was extended.  Balloon  catheter was advanced deep in the bile duct, inflated to about 10 mm, then  trolled through the duct with removal of debris and sludge, pieces of broken  stone.  I was able to pass the balloon across the sphincterotomy without any  difficulty.  The endoscope was withdrawn.  The patient tolerated the  procedure well.   FINAL DIAGNOSES:  1.  Biliary stent removed.  2.  Dilated extrahepatic biliary system with filling defects.  Sphincterotomy extended and debris and/or pieces of broken stone      removed.   RECOMMENDATIONS:  1.  Will continue Levaquin for another six days at 250 mg p.o. daily.  We      also did a chest x-ray, which was negative for pneumonia.  Feel fever      may be secondary to bronchitis.  2.  She will return for OV in four weeks from now.     Donna [name]   NR/MEDQ  D:  06/17/2004  T:  06/17/2004  Job:  [id number]   cc:   Milus Mallick. Lodema Hong, M.D.  [address]  [place], [place] [address]  Fax: [phone]

## 2011-01-27 NOTE — Discharge Summary (Signed)
   NAME:  Donna [name], Donna [name] NO.:  [id number]   MEDICAL RECORD NO.:  [id number]                   PATIENT TYPE:  INP   LOCATION:  3010                                 FACILITY:  [facility]   PHYSICIAN:  Cristi Loron, M.D.            DATE OF BIRTH:  [birth year]   DATE OF ADMISSION:  08/01/2002  DATE OF DISCHARGE:                                 DISCHARGE SUMMARY   HISTORY OF PRESENT ILLNESS:  For full details of this admission please refer  to the typed history and physical.  The patient is a 55 year old lady with  severe spondylosis and positive discography at L1 S1.   HOSPITAL COURSE:  Dr. Channing Mutters admitted the patient to [facility] on  08/01/02 with a diagnosis of L5 S1 spondylosis.  On the day of admission he  performed an L5 S1 laminectomy diskectomy posterior lumbar interbody fusion  with Ray cages.  The surgery went well without complications.  For the full  details of this operation please refer to the type operative note.   The patient's postoperative course was unremarkable.  She did have one fever  to 101.9 with no other clinical signs of infection.  By 08/04/02 the patient  was afebrile.  Her vital signs were stable.  She was eating well, ambulating  well, and her wound was healing well without signs of infection.  She was  requesting discharge.  She was therefore discharged on 08/04/02.   FOLLOW UP:  She was to follow up with Dr. Channing Mutters in 1 week for suture removal.   DISCHARGE MEDICATIONS:  1. Flexeril 10 mg #60 one p.o. t.i.d. p.r.n. for muscle spasms. No refills.  2. Demerol 50 mg #60 one-two p.o. q.4h. p.r.n. for pain. No refills.   DISCHARGE DIAGNOSIS:  L5-S1 spondylosis.   PROCEDURES PERFORMED:  1. L5 laminectomy.  2. Post lumbar interbody fusion placement of Ray cages.                                               Cristi Loron, M.D.    JDJ/MEDQ  D:  08/04/2002  T:  08/04/2002  Job:  [id number]

## 2012-04-07 DIAGNOSIS — I214 Non-ST elevation (NSTEMI) myocardial infarction: Secondary | ICD-10-CM

## 2012-04-08 ENCOUNTER — Other Ambulatory Visit: Payer: Self-pay

## 2012-04-08 ENCOUNTER — Encounter: Payer: Self-pay | Admitting: Physician Assistant

## 2012-04-08 DIAGNOSIS — I214 Non-ST elevation (NSTEMI) myocardial infarction: Secondary | ICD-10-CM

## 2012-04-18 ENCOUNTER — Emergency Department (HOSPITAL_COMMUNITY): Payer: Medicare Other

## 2012-04-18 ENCOUNTER — Emergency Department (HOSPITAL_COMMUNITY)
Admission: EM | Admit: 2012-04-18 | Discharge: 2012-04-19 | Disposition: A | Discharge status: Home / Self Care | Payer: Medicare Other | Attending: Emergency Medicine | Admitting: Emergency Medicine

## 2012-04-18 ENCOUNTER — Encounter (HOSPITAL_COMMUNITY): Payer: Self-pay | Admitting: *Deleted

## 2012-04-18 DIAGNOSIS — I252 Old myocardial infarction: Secondary | ICD-10-CM | POA: Insufficient documentation

## 2012-04-18 DIAGNOSIS — I2510 Atherosclerotic heart disease of native coronary artery without angina pectoris: Secondary | ICD-10-CM | POA: Insufficient documentation

## 2012-04-18 DIAGNOSIS — M7989 Other specified soft tissue disorders: Secondary | ICD-10-CM | POA: Insufficient documentation

## 2012-04-18 DIAGNOSIS — Z9981 Dependence on supplemental oxygen: Secondary | ICD-10-CM | POA: Insufficient documentation

## 2012-04-18 DIAGNOSIS — F17200 Nicotine dependence, unspecified, uncomplicated: Secondary | ICD-10-CM | POA: Insufficient documentation

## 2012-04-18 DIAGNOSIS — J8410 Pulmonary fibrosis, unspecified: Secondary | ICD-10-CM | POA: Insufficient documentation

## 2012-04-18 DIAGNOSIS — R0902 Hypoxemia: Secondary | ICD-10-CM | POA: Insufficient documentation

## 2012-04-18 DIAGNOSIS — Z9089 Acquired absence of other organs: Secondary | ICD-10-CM | POA: Insufficient documentation

## 2012-04-18 DIAGNOSIS — J4489 Other specified chronic obstructive pulmonary disease: Secondary | ICD-10-CM | POA: Insufficient documentation

## 2012-04-18 DIAGNOSIS — M329 Systemic lupus erythematosus, unspecified: Secondary | ICD-10-CM | POA: Insufficient documentation

## 2012-04-18 DIAGNOSIS — J449 Chronic obstructive pulmonary disease, unspecified: Secondary | ICD-10-CM

## 2012-04-18 HISTORY — DX: Unspecified asthma, uncomplicated: J45.909

## 2012-04-18 HISTORY — DX: Atherosclerotic heart disease of native coronary artery without angina pectoris: I25.10

## 2012-04-18 HISTORY — DX: Systemic lupus erythematosus, unspecified: M32.9

## 2012-04-18 HISTORY — DX: Reserved for concepts with insufficient information to code with codable children: IMO0002

## 2012-04-18 HISTORY — DX: Unspecified viral hepatitis C without hepatic coma: B19.20

## 2012-04-18 LAB — BASIC METABOLIC PANEL
BUN: 14 mg/dL (ref 6–23)
Chloride: 107 mEq/L (ref 96–112)
Creatinine, Ser: 0.68 mg/dL (ref 0.50–1.10)
GFR calc Af Amer: >90 mL/min (ref >90)
Glucose, Bld: 137 mg/dL — ABNORMAL HIGH (ref 70–99)

## 2012-04-18 LAB — CBC WITH DIFFERENTIAL/PLATELET
Basophils Relative: 0 % (ref 0–1)
HCT: 45.8 % (ref 36.0–46.0)
Hemoglobin: 14.7 g/dL (ref 12.0–15.0)
Lymphs Abs: 0.8 10*3/uL (ref 0.7–4.0)
MCH: 32.2 pg (ref 26.0–34.0)
MCHC: 32.1 g/dL (ref 30.0–36.0)
Monocytes Absolute: 0.2 10*3/uL (ref 0.1–1.0)
Monocytes Relative: 2 % — ABNORMAL LOW (ref 3–12)
Neutro Abs: 9.3 10*3/uL — ABNORMAL HIGH (ref 1.7–7.7)

## 2012-04-18 MED ORDER — IOHEXOL 350 MG/ML SOLN
100.0000 mL | Freq: Once | INTRAVENOUS | Status: AC | PRN
  Administered 2012-04-18: 100 mL via INTRAVENOUS

## 2012-04-18 MED ORDER — ASPIRIN 81 MG PO CHEW
324.0000 mg | CHEWABLE_TABLET | Freq: Once | ORAL | Status: AC
  Administered 2012-04-18: 324 mg via ORAL
  Filled 2012-04-18: qty 4

## 2012-04-18 NOTE — ED Notes (Signed)
Pt ambulated to bathroom and 02 sats dropped to 68%. Pt placed back on 02 via nasal cannula at 3L and o2 sats went up to 95%. NAD noted at this time. Pts homecare has been notified and is going to setup o2 for pt prior to discharge from the ED

## 2012-04-18 NOTE — ED Notes (Signed)
Pt discharged - waiting on [facility] to come and set up oxygen for pt to go home with. Pt is still on o2 at 2L at this time. NAD noted

## 2012-04-18 NOTE — ED Provider Notes (Signed)
History  This chart was scribed for Ezell Poke B. Bernette Mayers, MD by Bennett Scrape. This patient was seen in room APA14/APA14 and the patient's care was started at 6:49PM.  CSN: [id number]  Arrival date & time 04/18/12  1717   First MD Initiated Contact with Patient 04/18/12 1849      Chief Complaint  Patient presents with  . Foot Swelling     The history is provided by the patient. No language interpreter was used.    Donna [name] is a 56 y.o. female who presents to the Emergency Department complaining of low oxygen saturation level. She states that she was told to come to the ED by her home health care nurse due to a low oxygen level. She states that she had a recent MI on 04/07/12. She was seen by Dr. Diona Browner at [facility], was told that it was caused by hypotension (60/40) and discharged home 04/15/12. She denies heart catheterization and reports that hypotension is a recurrent conditions for her. She denies having CP currently. She reports that her SpO2 reportedly 97% when discharged but reports that she smoked 3 to 4 cigarettes in the last 3 to 4 days. She also c/o chronic back pain from DDD, increased anxiety and bilateral feet swelling that she states is chronic from "heart related issues". She denies fever, chills, abdominal pain, SOB, cough and lethargy as associated symptoms. She also has a h/o CAD, lupus and Hep C and is a current everyday smoker and occasional alcohol user.   PCP is Juanito Doom, [place] for Dr. Margo Common in [place].  Past Medical History  Diagnosis Date  . Coronary artery disease   . Degenerative disc disease   . Asthma   . Lupus   . Hepatitis C   MI per pt at bedside.  Past Surgical History  Procedure Date  . Cholecystectomy   . Appendectomy   . Tonsillectomy   . Tubal ligation   . Polyps on vocal cords     History reviewed. No pertinent family history.  History  Substance Use Topics  . Smoking status: Current Everyday Smoker  . Smokeless tobacco: Not on  file  . Alcohol Use: Yes     occ    No OB history provided.  Review of Systems  A complete 10 system review of systems was obtained and all systems are negative except as noted in the HPI and PMH.   Allergies  Codeine; Penicillins; and Sulfa antibiotics  Home Medications  No current outpatient prescriptions on file.  Triage Vitals: BP 115/71  Pulse 92  Resp 20  SpO2 95%  Physical Exam  Nursing note and vitals reviewed. Constitutional: She is oriented to person, place, and time. She appears well-developed and well-nourished.  HENT:  Head: Normocephalic and atraumatic.  Eyes: EOM are normal. Pupils are equal, round, and reactive to light.  Neck: Normal range of motion. Neck supple.  Cardiovascular: Normal rate, normal heart sounds and intact distal pulses.   Pulmonary/Chest: Effort normal and breath sounds normal.  Abdominal: Bowel sounds are normal. She exhibits no distension. There is no tenderness.  Musculoskeletal: Normal range of motion. She exhibits edema (mild edema to feet bilaterally ). She exhibits no tenderness.  Neurological: She is alert and oriented to person, place, and time. She has normal strength. No cranial nerve deficit or sensory deficit.  Skin: Skin is warm and dry. No rash noted.  Psychiatric: She has a normal mood and affect.    ED Course  Procedures (including  critical care time)  DIAGNOSTIC STUDIES: Oxygen Saturation is 78% on room air, low by my interpretation.    COORDINATION OF CARE: 7:05PM-Discussed treatment plan with pt at bedside and pt agreed to plan.   Labs Reviewed - No data to display Dg Chest 2 View  04/18/2012  *RADIOLOGY REPORT*  Clinical Data: Cough and bilateral leg swelling.  Smoker.  Asthma.  CHEST - 2 VIEW  Comparison: 04/07/2012.  Findings: The heart remains normal in size.  Stable prominence of the interstitial markings and mild hyperexpansion of the lungs. Mild thoracic spine degenerative changes.  The bones appear  somewhat osteopenic.  IMPRESSION: Stable changes of COPD.  No acute abnormality.  Original Report Authenticated By: Darrol Angel, M.D.     No diagnosis found.    MDM   Date: 04/18/2012  Rate: 94  Rhythm: normal sinus rhythm  QRS Axis: normal  Intervals: normal  ST/T Wave abnormalities: normal  Conduction Disutrbances: none  Narrative Interpretation: unremarkable   I personally performed the services described in the documentation, which were scribed in my presence. The recorded information has been reviewed and considered.   Pt with numerous complaints, however on room air her SpO2 was 75% on my initial eval. She was not dyspneic or cyanotic. Nasal canula applied and levels improved.    9:08 PM Pt resting comfortably with [place] oxygen, SpO2 is 95%. She has an elevated Pro-BNP with unknown baseline in setting of CXR with no edema. [facility], she had echo with EF 55-60%. Doubt her symptoms today are from acute CHF. Dimer positive but CTA neg for PE, no pulm edema. Pt is adamant that she wants to go home. Will attempt to get home oxygen for her through [facility] as they are already involved in her care.     Niemah Schwebke B. Bernette Mayers, MD 04/18/12 2210

## 2012-04-18 NOTE — ED Notes (Signed)
Patient does not need anything at this time. 

## 2012-04-18 NOTE — ED Notes (Signed)
Feet swelling, since yesterday,  Had MI 7/28 , seen at [facility].

## 2012-04-23 ENCOUNTER — Encounter (HOSPITAL_COMMUNITY): Payer: Self-pay | Admitting: *Deleted

## 2012-04-23 ENCOUNTER — Inpatient Hospital Stay (HOSPITAL_COMMUNITY): Payer: Medicare Other

## 2012-04-23 ENCOUNTER — Inpatient Hospital Stay (HOSPITAL_COMMUNITY)
Admission: EM | Admit: 2012-04-23 | Discharge: 2012-04-26 | DRG: 208 | Disposition: A | Discharge status: Home / Self Care | Payer: Medicare Other | Source: Other Acute Inpatient Hospital | Attending: Internal Medicine | Admitting: Internal Medicine

## 2012-04-23 DIAGNOSIS — J9601 Acute respiratory failure with hypoxia: Secondary | ICD-10-CM

## 2012-04-23 DIAGNOSIS — E872 Acidosis, unspecified: Secondary | ICD-10-CM | POA: Diagnosis present

## 2012-04-23 DIAGNOSIS — I214 Non-ST elevation (NSTEMI) myocardial infarction: Secondary | ICD-10-CM | POA: Diagnosis present

## 2012-04-23 DIAGNOSIS — I2510 Atherosclerotic heart disease of native coronary artery without angina pectoris: Secondary | ICD-10-CM | POA: Diagnosis present

## 2012-04-23 DIAGNOSIS — F329 Major depressive disorder, single episode, unspecified: Secondary | ICD-10-CM

## 2012-04-23 DIAGNOSIS — K219 Gastro-esophageal reflux disease without esophagitis: Secondary | ICD-10-CM | POA: Diagnosis present

## 2012-04-23 DIAGNOSIS — F17200 Nicotine dependence, unspecified, uncomplicated: Secondary | ICD-10-CM | POA: Diagnosis present

## 2012-04-23 DIAGNOSIS — G9340 Encephalopathy, unspecified: Secondary | ICD-10-CM | POA: Diagnosis present

## 2012-04-23 DIAGNOSIS — M329 Systemic lupus erythematosus, unspecified: Secondary | ICD-10-CM | POA: Diagnosis present

## 2012-04-23 DIAGNOSIS — I519 Heart disease, unspecified: Secondary | ICD-10-CM | POA: Diagnosis present

## 2012-04-23 DIAGNOSIS — J441 Chronic obstructive pulmonary disease with (acute) exacerbation: Secondary | ICD-10-CM | POA: Diagnosis present

## 2012-04-23 DIAGNOSIS — J9600 Acute respiratory failure, unspecified whether with hypoxia or hypercapnia: Principal | ICD-10-CM | POA: Diagnosis present

## 2012-04-23 DIAGNOSIS — J811 Chronic pulmonary edema: Secondary | ICD-10-CM

## 2012-04-23 DIAGNOSIS — B1920 Unspecified viral hepatitis C without hepatic coma: Secondary | ICD-10-CM | POA: Diagnosis present

## 2012-04-23 LAB — BASIC METABOLIC PANEL
BUN: 15 mg/dL (ref 6–23)
GFR calc non Af Amer: >90 mL/min (ref >90)
Glucose, Bld: 141 mg/dL — ABNORMAL HIGH (ref 70–99)
Potassium: 4 mEq/L (ref 3.5–5.1)

## 2012-04-23 LAB — POCT I-STAT 3, ART BLOOD GAS (G3+)
O2 Saturation: 98 %
O2 Saturation: 90 %
TCO2: 35 mmol/L (ref 0–100)
pCO2 arterial: 86.1 mmHg (ref 35.0–45.0)
pCO2 arterial: 59.2 mmHg (ref 35.0–45.0)
pH, Arterial: 7.349 — ABNORMAL LOW (ref 7.350–7.450)
pO2, Arterial: 125 mmHg — ABNORMAL HIGH (ref 80.0–100.0)
pO2, Arterial: 61 mmHg — ABNORMAL LOW (ref 80.0–100.0)

## 2012-04-23 LAB — GLUCOSE, CAPILLARY
Glucose-Capillary: 149 mg/dL — ABNORMAL HIGH (ref 70–99)
Glucose-Capillary: 121 mg/dL — ABNORMAL HIGH (ref 70–99)
Glucose-Capillary: 129 mg/dL — ABNORMAL HIGH (ref 70–99)
Glucose-Capillary: 139 mg/dL — ABNORMAL HIGH (ref 70–99)

## 2012-04-23 LAB — CARDIAC PANEL(CRET KIN+CKTOT+MB+TROPI)
CK, MB: 5.1 ng/mL — ABNORMAL HIGH (ref 0.3–4.0)
CK, MB: 4.2 ng/mL — ABNORMAL HIGH (ref 0.3–4.0)
Total CK: 31 U/L (ref 7–177)
Troponin I: 0.75 ng/mL (ref <0.30)

## 2012-04-23 LAB — PRO B NATRIURETIC PEPTIDE: Pro B Natriuretic peptide (BNP): 19352 pg/mL — ABNORMAL HIGH (ref 0–125)

## 2012-04-23 LAB — CBC
HCT: 47.2 % — ABNORMAL HIGH (ref 36.0–46.0)
Hemoglobin: 15.1 g/dL — ABNORMAL HIGH (ref 12.0–15.0)
RDW: 14.1 % (ref 11.5–15.5)
WBC: 6.5 10*3/uL (ref 4.0–10.5)

## 2012-04-23 LAB — HEPARIN LEVEL (UNFRACTIONATED)
Heparin Unfractionated: 0.58 IU/mL (ref 0.30–0.70)
Heparin Unfractionated: 0.41 IU/mL (ref 0.30–0.70)

## 2012-04-23 MED ORDER — ASPIRIN 81 MG PO CHEW: 324.0000 mg | CHEWABLE_TABLET | ORAL | Status: AC

## 2012-04-23 MED ORDER — ALBUTEROL SULFATE (5 MG/ML) 0.5% IN NEBU
2.5000 mg | INHALATION_SOLUTION | RESPIRATORY_TRACT | Status: DC | PRN
  Administered 2012-04-24: 2.5 mg via RESPIRATORY_TRACT
  Filled 2012-04-23: qty 0.5

## 2012-04-23 MED ORDER — METHYLPREDNISOLONE SODIUM SUCC 125 MG IJ SOLR
80.0000 mg | Freq: Two times a day (BID) | INTRAMUSCULAR | Status: DC
  Administered 2012-04-23 – 2012-04-24 (×3): 80 mg via INTRAVENOUS
  Filled 2012-04-23 (×2): qty 1.28
  Filled 2012-04-23: qty 2
  Filled 2012-04-23 (×2): qty 1.28

## 2012-04-23 MED ORDER — FENTANYL CITRATE 0.05 MG/ML IJ SOLN
50.0000 ug | INTRAMUSCULAR | Status: DC | PRN
  Administered 2012-04-23: 100 ug via INTRAVENOUS
  Filled 2012-04-23 (×2): qty 2

## 2012-04-23 MED ORDER — HEPARIN (PORCINE) IN NACL 100-0.45 UNIT/ML-% IJ SOLN
1300.0000 [IU]/h | INTRAMUSCULAR | Status: DC
  Administered 2012-04-23: 1100 [IU]/h via INTRAVENOUS
  Administered 2012-04-23: 800 [IU]/h via INTRAVENOUS
  Administered 2012-04-24 (×2): 1300 [IU]/h via INTRAVENOUS
  Filled 2012-04-23 (×3): qty 250

## 2012-04-23 MED ORDER — MIDAZOLAM BOLUS VIA INFUSION
1.0000 mg | INTRAVENOUS | Status: DC | PRN
  Filled 2012-04-23: qty 2

## 2012-04-23 MED ORDER — POTASSIUM CHLORIDE 20 MEQ/15ML (10%) PO LIQD
40.0000 meq | Freq: Three times a day (TID) | ORAL | Status: AC
  Administered 2012-04-23 (×2): 40 meq
  Filled 2012-04-23 (×2): qty 30

## 2012-04-23 MED ORDER — ROCURONIUM BROMIDE 50 MG/5ML IV SOLN
INTRAVENOUS | Status: AC
  Administered 2012-04-23: 10 mg
  Filled 2012-04-23: qty 2

## 2012-04-23 MED ORDER — LIDOCAINE HCL (CARDIAC) 20 MG/ML IV SOLN
INTRAVENOUS | Status: AC
  Filled 2012-04-23: qty 5

## 2012-04-23 MED ORDER — FUROSEMIDE 10 MG/ML IJ SOLN
INTRAMUSCULAR | Status: AC
  Administered 2012-04-23: 20 mg via INTRAVENOUS
  Filled 2012-04-23: qty 4

## 2012-04-23 MED ORDER — ALBUTEROL SULFATE (5 MG/ML) 0.5% IN NEBU
2.5000 mg | INHALATION_SOLUTION | RESPIRATORY_TRACT | Status: DC
  Administered 2012-04-23 (×4): 2.5 mg via RESPIRATORY_TRACT
  Filled 2012-04-23 (×4): qty 0.5

## 2012-04-23 MED ORDER — HEPARIN BOLUS VIA INFUSION
2000.0000 [IU] | Freq: Once | INTRAVENOUS | Status: AC
  Administered 2012-04-23: 2000 [IU] via INTRAVENOUS
  Filled 2012-04-23: qty 2000

## 2012-04-23 MED ORDER — SODIUM CHLORIDE 0.9 % IV SOLN: 250.0000 mL | INTRAVENOUS | Status: DC | PRN

## 2012-04-23 MED ORDER — IPRATROPIUM BROMIDE 0.02 % IN SOLN
0.5000 mg | RESPIRATORY_TRACT | Status: DC
  Administered 2012-04-23 (×4): 0.5 mg via RESPIRATORY_TRACT
  Filled 2012-04-23 (×4): qty 2.5

## 2012-04-23 MED ORDER — OXEPA PO LIQD
1000.0000 mL | ORAL | Status: DC
  Administered 2012-04-23: 1000 mL
  Filled 2012-04-23 (×3): qty 1000

## 2012-04-23 MED ORDER — ALBUTEROL SULFATE HFA 108 (90 BASE) MCG/ACT IN AERS
6.0000 | INHALATION_SPRAY | RESPIRATORY_TRACT | Status: DC
  Filled 2012-04-23: qty 6.7

## 2012-04-23 MED ORDER — CHLORHEXIDINE GLUCONATE 0.12 % MT SOLN
15.0000 mL | Freq: Two times a day (BID) | OROMUCOSAL | Status: DC
  Administered 2012-04-23 – 2012-04-24 (×3): 15 mL via OROMUCOSAL
  Filled 2012-04-23 (×3): qty 15

## 2012-04-23 MED ORDER — BIOTENE DRY MOUTH MT LIQD
15.0000 mL | Freq: Four times a day (QID) | OROMUCOSAL | Status: DC
  Administered 2012-04-23 – 2012-04-24 (×4): 15 mL via OROMUCOSAL

## 2012-04-23 MED ORDER — SODIUM CHLORIDE 0.9 % IJ SOLN
3.0000 mL | Freq: Two times a day (BID) | INTRAMUSCULAR | Status: DC
  Administered 2012-04-23 – 2012-04-25 (×2): 3 mL via INTRAVENOUS

## 2012-04-23 MED ORDER — FUROSEMIDE 10 MG/ML IJ SOLN
20.0000 mg | Freq: Four times a day (QID) | INTRAMUSCULAR | Status: AC
  Administered 2012-04-23 (×3): 20 mg via INTRAVENOUS
  Filled 2012-04-23 (×2): qty 2

## 2012-04-23 MED ORDER — ALBUTEROL SULFATE HFA 108 (90 BASE) MCG/ACT IN AERS
6.0000 | INHALATION_SPRAY | RESPIRATORY_TRACT | Status: DC | PRN
  Filled 2012-04-23: qty 6.7

## 2012-04-23 MED ORDER — SODIUM CHLORIDE 0.9 % IJ SOLN: 3.0000 mL | INTRAMUSCULAR | Status: DC | PRN

## 2012-04-23 MED ORDER — MIDAZOLAM HCL 2 MG/2ML IJ SOLN
2.0000 mg | INTRAMUSCULAR | Status: DC | PRN
  Administered 2012-04-23: 2 mg via INTRAVENOUS
  Filled 2012-04-23: qty 4
  Filled 2012-04-23 (×2): qty 2

## 2012-04-23 MED ORDER — SODIUM CHLORIDE 0.9 % IV BOLUS (SEPSIS)
500.0000 mL | Freq: Once | INTRAVENOUS | Status: AC
  Administered 2012-04-23: 500 mL via INTRAVENOUS

## 2012-04-23 MED ORDER — ASPIRIN 300 MG RE SUPP: 300.0000 mg | RECTAL | Status: AC

## 2012-04-23 MED ORDER — SODIUM CHLORIDE 0.9 % IV SOLN
50.0000 ug/h | INTRAVENOUS | Status: DC
  Administered 2012-04-23: 100 ug/h via INTRAVENOUS
  Administered 2012-04-24: 50 ug/h via INTRAVENOUS
  Filled 2012-04-23 (×2): qty 50

## 2012-04-23 MED ORDER — ETOMIDATE 2 MG/ML IV SOLN
INTRAVENOUS | Status: AC
  Administered 2012-04-23: 20 mg
  Filled 2012-04-23: qty 20

## 2012-04-23 MED ORDER — MIDAZOLAM HCL 2 MG/2ML IJ SOLN
INTRAMUSCULAR | Status: AC
  Filled 2012-04-23: qty 2

## 2012-04-23 MED ORDER — MIDAZOLAM HCL 5 MG/ML IJ SOLN
2.0000 mg/h | INTRAMUSCULAR | Status: DC
  Administered 2012-04-23: 4 mg/h via INTRAVENOUS
  Administered 2012-04-23: 5 mg/h via INTRAVENOUS
  Administered 2012-04-24: 3 mg/h via INTRAVENOUS
  Filled 2012-04-23 (×3): qty 10

## 2012-04-23 MED ORDER — FENTANYL BOLUS VIA INFUSION
50.0000 ug | Freq: Four times a day (QID) | INTRAVENOUS | Status: DC | PRN
  Filled 2012-04-23: qty 100

## 2012-04-23 MED ORDER — MOXIFLOXACIN HCL IN NACL 400 MG/250ML IV SOLN
400.0000 mg | INTRAVENOUS | Status: DC
  Administered 2012-04-23 – 2012-04-25 (×3): 400 mg via INTRAVENOUS
  Filled 2012-04-23 (×3): qty 250

## 2012-04-23 MED ORDER — FENTANYL CITRATE 0.05 MG/ML IJ SOLN
INTRAMUSCULAR | Status: AC
  Administered 2012-04-23: 100 ug
  Filled 2012-04-23: qty 2

## 2012-04-23 MED ORDER — ADULT MULTIVITAMIN W/MINERALS CH
1.0000 | ORAL_TABLET | Freq: Every day | ORAL | Status: DC
  Administered 2012-04-23 – 2012-04-25 (×3): 1
  Filled 2012-04-23 (×4): qty 1

## 2012-04-23 MED ORDER — FAMOTIDINE IN NACL 20-0.9 MG/50ML-% IV SOLN
20.0000 mg | Freq: Two times a day (BID) | INTRAVENOUS | Status: DC
  Administered 2012-04-23 – 2012-04-24 (×4): 20 mg via INTRAVENOUS
  Filled 2012-04-23 (×5): qty 50

## 2012-04-23 MED ORDER — SUCCINYLCHOLINE CHLORIDE 20 MG/ML IJ SOLN
INTRAMUSCULAR | Status: AC
  Filled 2012-04-23: qty 10

## 2012-04-23 MED ORDER — IPRATROPIUM-ALBUTEROL 18-103 MCG/ACT IN AERO
6.0000 | INHALATION_SPRAY | RESPIRATORY_TRACT | Status: DC
  Administered 2012-04-23 – 2012-04-24 (×3): 6 via RESPIRATORY_TRACT
  Filled 2012-04-23: qty 14.7

## 2012-04-23 MED ORDER — PRO-STAT SUGAR FREE PO LIQD
30.0000 mL | Freq: Every day | ORAL | Status: DC
  Administered 2012-04-23 – 2012-04-24 (×4): 30 mL
  Filled 2012-04-23 (×9): qty 30

## 2012-04-23 MED ORDER — IPRATROPIUM BROMIDE 0.02 % IN SOLN: 0.5000 mg | RESPIRATORY_TRACT | Status: DC | PRN

## 2012-04-23 NOTE — Progress Notes (Signed)
Patient ID: Donna [name], female   DOB: [birth year], 56 y.o.   MRN: [id number]    Subjective:  Sedated intubated on vent Objective:  Filed Vitals:   04/23/12 0300 04/23/12 0351 04/23/12 0500 04/23/12 0530  BP: 105/65 108/62 142/35 103/61  Pulse: 72 74 81 66  Temp: 97.7 F (36.5 C)     TempSrc: Oral     Resp: 16 12 20 18   Height: 5\' 2"  (1.575 m)     Weight: 154 lb 15.7 oz (70.3 kg)     SpO2: 96% 98% 94% 87%    Intake/Output from previous day:  Intake/Output Summary (Last 24 hours) at 04/23/12 0751 Last data filed at 04/23/12 0700  Gross per 24 hour  Intake    453 ml  Output    800 ml  Net   -347 ml    Physical Exam:  Intubated sedated HEENT: normal Neck supple with no adenopathy JVP normal no bruits no thyromegaly Lungs rhonchi  Heart:  S1/S2 no murmur, no rub, gallop or click PMI normal Abdomen: benighn, BS positve, no tenderness, no AAA no bruit.  No HSM or HJR Distal pulses intact with no bruits No edema Neuro non-focal Skin warm and dry    Lab Results: Basic Metabolic Panel:  [facility] 04/23/12 0427  NA 143  K 4.0  CL 99  CO2 34*  GLUCOSE 141*  BUN 15  CREATININE 0.72  CALCIUM 8.7  MG --  PHOS --   CBC:  Basename 04/23/12 0427  WBC 6.5  NEUTROABS --  HGB 15.1*  HCT 47.2*  MCV 101.7*  PLT 226   Cardiac Enzymes:  Basename 04/23/12 0327  CKTOTAL 43  CKMB 6.0*  CKMBINDEX --  TROPONINI 0.75*   Imaging: Portable Chest Xray  04/23/2012  *RADIOLOGY REPORT*  Clinical Data: Endotracheal tube placement.  PORTABLE CHEST - 1 VIEW  Comparison: 04/23/2012.  Findings: Interval placement of endotracheal tube with the tip 2 cm from the carina.  Enteric tube has been placed.  The enteric tube is proximal and should be advanced 10 cm for better positioning. Increasing basilar atelectasis.  There may be some basilar airspace disease as well, likely representing pulmonary edema.  IMPRESSION:  1.  Endotracheal tube 2 cm from the carina. 2.  Proximal  position of nasogastric tube which should be advanced 10 cm. 3.  Worsening aeration with increasing basilar atelectasis and probable basilar airspace disease/edema.  Original Report Authenticated By: Andreas Newport, M.D.   Portable Chest Xray  04/23/2012  *RADIOLOGY REPORT*  Clinical Data: Congestive heart failure.  Short of breath.  COPD.  PORTABLE CHEST - 1 VIEW  Comparison: 04/22/2012.  Findings: Cardiomegaly.  Basilar interstitial and mild airspace opacity is present superimposed on chronic changes.  Findings are compatible with mild CHF.  Linear subsegmental atelectasis in the retrocardiac region.  No focal consolidation or effusion. Underlying emphysema.  IMPRESSION: Constellation of findings compatible with mild CHF.  Subsegmental atelectasis at the left lung base.  Original Report Authenticated By: Andreas Newport, M.D.    Cardiac Studies:  ECG:    Telemetry: NSR no arrythmia 04/23/2012   Echo: 7/29 [place] EF 60-65%  Mild to moderate LVH  Medications:     . albuterol  6-8 puff Inhalation Q4H  . ipratropium  0.5 mg Nebulization Q4H   And  . albuterol  2.5 mg Nebulization Q4H  . antiseptic oral rinse  15 mL Mouth Rinse QID  . aspirin  324 mg Oral NOW   Or  .  aspirin  300 mg Rectal NOW  . chlorhexidine  15 mL Mouth Rinse BID  . etomidate      . famotidine (PEPCID) IV  20 mg Intravenous Q12H  . fentaNYL      . heparin  2,000 Units Intravenous Once  . lidocaine (cardiac) 100 mg/65ml      . methylPREDNISolone (SOLU-MEDROL) injection  80 mg Intravenous Q12H  . midazolam      . moxifloxacin  400 mg Intravenous Q24H  . rocuronium      . sodium chloride  3 mL Intravenous Q12H  . succinylcholine           . fentaNYL infusion INTRAVENOUS 100 mcg/hr (04/23/12 0541)  . heparin 1,100 Units/hr (04/23/12 0656)  . midazolam (VERSED) infusion 5 mg/hr (04/23/12 0541)    Assessment/Plan:  Cardiac:  Minimal elevation of CPK and troponin not significant  ? ARDS or diastolic dysfunction  on top of COPD Continue vent support and steroids.  Diureses as needed  EF normal by echo.  No evidence of acute coronary syndrome  Charlton Haws 04/23/2012, 7:51 AM

## 2012-04-23 NOTE — Progress Notes (Signed)
ANTICOAGULATION CONSULT NOTE - Initial Consult  Pharmacy Consult for heparin  Indication: chest pain/ACS  Allergies  Allergen Reactions  . Corn-Containing Products Shortness Of Breath and Swelling    REACTION: affects breathing  . Latex Shortness Of Breath, Itching and Rash  . Codeine Itching and Nausea And Vomiting  . Penicillins Other (See Comments)    REACTION: "thrush"  . Sulfa Antibiotics Other (See Comments)    REACTION: Unknown    Patient Measurements: Height: 5\' 2"  (157.5 cm) Weight: 154 lb 15.7 oz (70.3 kg) IBW/kg (Calculated) : 50.1  Heparin Dosing Weight: 65 kg  Vital Signs: Temp: 97.7 F (36.5 C) (08/13 0300) Temp src: Oral (08/13 0300) BP: 105/65 mmHg (08/13 0300) Pulse Rate: 72  (08/13 0300)  Labs: No results found for this basename: HGB:2,HCT:3,PLT:3,APTT:3,LABPROT:3,INR:3,HEPARINUNFRC:3,CREATININE:3,CKTOTAL:3,CKMB:3,TROPONINI:3 in the last 72 hours  Estimated Creatinine Clearance: 71.3 ml/min (by C-G formula based on Cr of 0.68).   Medical History: Past Medical History  Diagnosis Date  . Coronary artery disease   . Degenerative disc disease   . Asthma   . Lupus   . Hepatitis C     Medications:  Scheduled:    . ipratropium  0.5 mg Nebulization Q4H   And  . albuterol  2.5 mg Nebulization Q4H  . aspirin  324 mg Oral NOW   Or  . aspirin  300 mg Rectal NOW  . famotidine (PEPCID) IV  20 mg Intravenous Q12H  . methylPREDNISolone (SOLU-MEDROL) injection  80 mg Intravenous Q12H  . sodium chloride  3 mL Intravenous Q12H    Assessment: 56 yo female transferred from [place] with r/o ACS. Pharmacy consulted to manage IV heparin. At [facility], patient received 4000 unit IV bolus and then IV heparin infusing at 800 units/hr since ~ 23:30. Per [facility] labs from 8/12, Hgb 14.1, Plt 193 and INR 1.1.  Goal of Therapy:  Heparin level 0.3-0.7 units/ml Monitor platelets by anticoagulation protocol: Yes   Plan:  1. Continue IV heparin at 800 units/hr.    2. Heparin level at 05:30. 3. Daily CBC, heparin level.   Emeline Gins 04/23/2012,3:36 AM

## 2012-04-23 NOTE — Progress Notes (Signed)
Heparin per Pharmacy Indication: chest pain/ACS  Heparin level = 0.41 on 1100 units/hr Goal heparin level = 0.3-0.7  Heparin level is within desired therapeutic range. Continue same rate and f/u daily heparin level and CBC.  Cardell Peach, PharmD

## 2012-04-23 NOTE — Progress Notes (Signed)
eLink Physician-Brief Progress Note Patient Name: Donna [name] DOB: 11/20/1955 MRN: [id number]  Date of Service  04/23/2012   HPI/Events of Note   Hypotension No autopeep  eICU Interventions  Fluid bolus Lower versed dose   Intervention Category Intermediate Interventions: Hypotension - evaluation and management  Shervin Cypert V. 04/23/2012, 10:38 PM

## 2012-04-23 NOTE — Progress Notes (Signed)
INITIAL ADULT NUTRITION ASSESSMENT Date: 04/23/2012   Time: 10:03 AM Reason for Assessment: Vent, Nutrition Risk, consult for TF initiation and management  INTERVENTION: 1. Initiate Oxepa @ 25 ml/hr via OG tube, this is the goal rate. 30 ml Prostat 5 times daily.  At goal rate, tube feeding regimen will provide 1400 kcal, 113 grams of protein, and 471 ml of H2O. 2. Add daily multivitamin as this EN regimen does not meet 100% RDI's for vitamins and minerals  3. If no IV fluids, pt will need additional free water flushes to maintain hydration.  4. RD will continue to follow      ASSESSMENT: Female 56 y.o.  Dx: NSTEMI (non-ST elevated myocardial infarction)  Hx:  Past Medical History  Diagnosis Date  . Coronary artery disease   . Degenerative disc disease   . Asthma   . Lupus   . Hepatitis C     Related Meds:     . albuterol  6-8 puff Inhalation Q4H  . ipratropium  0.5 mg Nebulization Q4H   And  . albuterol  2.5 mg Nebulization Q4H  . antiseptic oral rinse  15 mL Mouth Rinse QID  . aspirin  324 mg Oral NOW   Or  . aspirin  300 mg Rectal NOW  . chlorhexidine  15 mL Mouth Rinse BID  . etomidate      . famotidine (PEPCID) IV  20 mg Intravenous Q12H  . fentaNYL      . heparin  2,000 Units Intravenous Once  . lidocaine (cardiac) 100 mg/55ml      . methylPREDNISolone (SOLU-MEDROL) injection  80 mg Intravenous Q12H  . midazolam      . moxifloxacin  400 mg Intravenous Q24H  . rocuronium      . sodium chloride  3 mL Intravenous Q12H  . succinylcholine         Ht: 5\' 2"  (157.5 cm)  Wt: 154 lb 15.7 oz (70.3 kg)  Ideal Wt: 50 kg  % Ideal Wt: 140%  Usual Wt:  Wt Readings from Last 5 Encounters:  04/23/12 154 lb 15.7 oz (70.3 kg)    % Usual Wt: --  Body mass index is 28.35 kg/(m^2). Pt is overweight based on current BMI.   Food/Nutrition Related Hx: Pt has problems chewing per nutrition risk assessment r/t missing teeth. Pt also with allergy to corn containing  products that results in problems breathing per allergies list.   Labs:  CMP     Component Value Date/Time   NA 143 04/23/2012 0427   K 4.0 04/23/2012 0427   CL 99 04/23/2012 0427   CO2 34* 04/23/2012 0427   GLUCOSE 141* 04/23/2012 0427   BUN 15 04/23/2012 0427   CREATININE 0.72 04/23/2012 0427   CALCIUM 8.7 04/23/2012 0427   GFRNONAA >90 04/23/2012 0427   GFRAA >90 04/23/2012 0427   No results found for this basename: mg   No results found for this basename: phos     Intake/Output Summary (Last 24 hours) at 04/23/12 1007 Last data filed at 04/23/12 0800  Gross per 24 hour  Intake    487 ml  Output    800 ml  Net   -313 ml     Diet Order:    Supplements/Tube Feeding:  IVF:    fentaNYL infusion INTRAVENOUS Last Rate: 100 mcg/hr (04/23/12 0541)  heparin Last Rate: 1,100 Units/hr (04/23/12 0656)  midazolam (VERSED) infusion Last Rate: 5 mg/hr (04/23/12 0541)   Temp (24hrs), Avg:98.6 F (  37 C), Min:97.7 F (36.5 C), Max:99.4 F (37.4 C) MVe: 7 Estimated Nutritional Needs:   Kcal: 1449 Protein: 105-125 gm  Fluid:  1.5 L   Pt admitted as a transfer from [facility] hospital, required intubation for airway protection/resp insufficiency on BiPAP. Pt seen in [facility] ED on 8/8 for low O2 sats. Possible ARDS or diastolic dysfunction with COPD per cards note.  RD consulted for initiation and management of EN. Per allergies, pt is allergic to "corn-containing products" RD spoke with pt's son and sister at the bedside, sister has lived with pt recently. Both confirm they were unaware of any problems with corn or corn containing products. Have seen the pt eat corn, but pt is no longer able to eat corn on the cobb r/t missing teeth.  Pt also noted to have problems chewing r/t missing teeth, RD will address appropriate food texture once extubated.   NUTRITION DIAGNOSIS: -Inadequate oral intake (NI-2.1).  Status: Ongoing  RELATED TO: inability to eat  AS EVIDENCE BY: NPO    MONITORING/EVALUATION(Goals): Goal: EN to provide >90% estimated nutrition needs Monitor: vent status, EN initiation, weight, labs  EDUCATION NEEDS: -No education needs identified at this time    DOCUMENTATION CODES Per approved criteria  -Not Applicable    Clarene Duke RD, LDN Pager [phone] After Hours pager [phone]

## 2012-04-23 NOTE — Progress Notes (Signed)
Pt received from [facility] via [facility]. Pt was on Bipap 16/8 40% when at [facility].

## 2012-04-23 NOTE — Progress Notes (Signed)
ANTICOAGULATION CONSULT NOTE  Pharmacy Consult for heparin  Indication: chest pain/ACS  Allergies  Allergen Reactions  . Corn-Containing Products Shortness Of Breath and Swelling    REACTION: affects breathing  . Latex Shortness Of Breath, Itching and Rash  . Codeine Itching and Nausea And Vomiting  . Penicillins Other (See Comments)    REACTION: "thrush"  . Sulfa Antibiotics Other (See Comments)    REACTION: Unknown    Patient Measurements: Height: 5\' 2"  (157.5 cm) Weight: 154 lb 15.7 oz (70.3 kg) IBW/kg (Calculated) : 50.1  Heparin Dosing Weight: 65 kg  Vital Signs: Temp: 99.2 F (37.3 C) (08/13 1200) Temp src: Oral (08/13 1200) BP: 131/71 mmHg (08/13 1118) Pulse Rate: 73  (08/13 1118)  Labs:  Basename 04/23/12 1127 04/23/12 0602 04/23/12 0427 04/23/12 0327  HGB -- -- 15.1* --  HCT -- -- 47.2* --  PLT -- -- 226 --  APTT -- -- -- --  LABPROT -- -- -- --  INR -- -- -- --  HEPARINUNFRC 0.58 <0.10* -- --  CREATININE -- -- 0.72 --  CKTOTAL 31 -- -- 43  CKMB 5.1* -- -- 6.0*  TROPONINI 1.10* -- -- 0.75*    Estimated Creatinine Clearance: 71.3 ml/min (by C-G formula based on Cr of 0.72).   Assessment: 56 yo female with NSTEMI on heparin and noted at goal (HL=0.58)  Goal of Therapy:  Heparin level 0.3-0.7 units/ml Monitor platelets by anticoagulation protocol: Yes   Plan:  -Will recheck later today to confirm as this was the first therapeutic level.  Harland German, Pharm D 04/23/2012 12:37 PM

## 2012-04-23 NOTE — Progress Notes (Signed)
ANTICOAGULATION CONSULT NOTE  Pharmacy Consult for heparin  Indication: chest pain/ACS  Allergies  Allergen Reactions  . Corn-Containing Products Shortness Of Breath and Swelling    REACTION: affects breathing  . Latex Shortness Of Breath, Itching and Rash  . Codeine Itching and Nausea And Vomiting  . Penicillins Other (See Comments)    REACTION: "thrush"  . Sulfa Antibiotics Other (See Comments)    REACTION: Unknown    Patient Measurements: Height: 5\' 2"  (157.5 cm) Weight: 154 lb 15.7 oz (70.3 kg) IBW/kg (Calculated) : 50.1  Heparin Dosing Weight: 65 kg  Vital Signs: Temp: 97.7 F (36.5 C) (08/13 0300) Temp src: Oral (08/13 0300) BP: 103/61 mmHg (08/13 0530) Pulse Rate: 66  (08/13 0530)  Labs:  Basename 04/23/12 0602 04/23/12 0427 04/23/12 0327  HGB -- 15.1* --  HCT -- 47.2* --  PLT -- 226 --  APTT -- -- --  LABPROT -- -- --  INR -- -- --  HEPARINUNFRC <0.10* -- --  CREATININE -- 0.72 --  CKTOTAL -- -- 43  CKMB -- -- 6.0*  TROPONINI -- -- 0.75*    Estimated Creatinine Clearance: 71.3 ml/min (by C-G formula based on Cr of 0.72).   Medical History: Past Medical History  Diagnosis Date  . Coronary artery disease   . Degenerative disc disease   . Asthma   . Lupus   . Hepatitis C    Assessment: 56 yo female with NSTEMI for Heparin  Goal of Therapy:  Heparin level 0.3-0.7 units/ml Monitor platelets by anticoagulation protocol: Yes   Plan:  Heparin 2000 units IV bolus, then increase heparin 1100 units/hr Check heparin level in 6 hours.   Donna [name] 04/23/2012,6:54 AM

## 2012-04-23 NOTE — Procedures (Signed)
Intubation Procedure Note TILLY PERNICE [id number] [birth year]  Procedure: Intubation Indications: Airway protection and maintenance  Procedure Details Consent: Unable to obtain consent because of emergent medical necessity. Time Out: Verified patient identification, verified procedure, site/side was marked, verified correct patient position, special equipment/implants available, medications/allergies/relevent history reviewed, required imaging and test results available.  Performed  Drugs Etomidate 20mg  IV, Rocuronium 50mg  IV DL x1 with MAC and 4 blade, Grade I view but deep; 7.5 ETT passed through cords under direct visualization; placement confirmed with positive ETT detector change, smoke in tube, bilateral breath sounds.   Evaluation Hemodynamic Status: BP stable throughout; O2 sats: stable throughout Patient's Current Condition: stable Complications: No apparent complications Patient did tolerate procedure well. Chest X-ray ordered to verify placement.  CXR: pending.   Max Fickle 04/23/2012

## 2012-04-23 NOTE — Progress Notes (Signed)
[facility] PCCM  Intubated for airway protection, resp insuficiency on BIPAP.  See intubation note.  Full vent support orders written.  Attempted to contact family by phone but they could not be reached.  Yolonda Kida PCCM Pager: [phone] Cell: [phone] If no response, call [phone]

## 2012-04-23 NOTE — H&P (Signed)
Name: Donna [name] MRN: [id number] DOB: June 20, 1956    LOS: 0  Referring Provider:  Maryruth Bun EDP Reason for Referral:  AE COPD, NSTEMI  PULMONARY / CRITICAL CARE MEDICINE  HPI:  This is a 56 y/o female with COPD and possible CAD (had a normal LHC in 2011) who was recently discharged from an outside hospital for a COPD exacerbation came back to the [facility] ED on 04/22/12 with worsening shortness of breath, confusion and chest pain.  She was found to have a respiratory acidosis and an elevated troponin.  She was placed on BIPAP, given solumedrol and lasix and transferred to [facility] for further evaluation.    Past Medical History  Diagnosis Date  . Coronary artery disease   . Degenerative disc disease   . Asthma   . Lupus   . Hepatitis C    Past Surgical History  Procedure Date  . Cholecystectomy   . Appendectomy   . Tonsillectomy   . Tubal ligation   . Polyps on vocal cords    Prior to Admission medications   Medication Sig Start Date End Date Taking? Authorizing Provider  acetaminophen (TYLENOL) 500 MG tablet Take 500 mg by mouth every 6 (six) hours as needed.    Historical Provider, MD  albuterol (PROAIR HFA) 108 (90 BASE) MCG/ACT inhaler Inhale 2 puffs into the lungs every 6 (six) hours as needed.    Historical Provider, MD  ALPRAZolam Prudy Feeler) 1 MG tablet Take 1 mg by mouth at bedtime as needed.    Historical Provider, MD  cetirizine (ZYRTEC) 10 MG tablet Take 10 mg by mouth daily.    Historical Provider, MD  HYDROcodone-acetaminophen (NORCO) 10-325 MG per tablet Take 1 tablet by mouth every 6 (six) hours as needed.    Historical Provider, MD  metoprolol tartrate (LOPRESSOR) 25 MG tablet Take 12.5 mg by mouth 2 (two) times daily.    Historical Provider, MD  pantoprazole (PROTONIX) 40 MG tablet Take 40 mg by mouth daily.    Historical Provider, MD  PARoxetine (PAXIL) 20 MG tablet Take 20 mg by mouth daily.    Historical Provider, MD  predniSONE (STERAPRED UNI-PAK) 10 MG tablet Take  10 mg by mouth daily. For 6 days. (6,5,4,3,2,1)    Historical Provider, MD   Allergies Allergies  Allergen Reactions  . Corn-Containing Products Shortness Of Breath and Swelling    REACTION: affects breathing  . Latex Shortness Of Breath, Itching and Rash  . Codeine Itching and Nausea And Vomiting  . Penicillins Other (See Comments)    REACTION: "thrush"  . Sulfa Antibiotics Other (See Comments)    REACTION: Unknown    Family History No family history on file. Social History  reports that she has been smoking.  She does not have any smokeless tobacco history on file. She reports that she drinks alcohol. She reports that she does not use illicit drugs.  Review Of Systems:  Cannot obtain due to somnolence  Brief patient description:  56 y/o female with advanced COPD who was admitted on 04/23/12 on transfer from [facility] with AE COPD, NSTEMI, and acute encephalopathy.  Events Since Admission: 04/23/12 CXR portable >> emphysema; bibasilar airspace disease improved from  04/22/12 ER film  Current Status: critically ill, on bipap  Vital Signs: Temp:  [97.7 F (36.5 C)] 97.7 F (36.5 C) (08/13 0300) Pulse Rate:  [72] 72  (08/13 0300) Resp:  [16] 16  (08/13 0300) BP: (105)/(65) 105/65 mmHg (08/13 0300) SpO2:  [96 %] 96 % (  08/13 0300) FiO2 (%):  [50 %] 50 % (08/13 0300) Weight:  [70.3 kg (154 lb 15.7 oz)] 70.3 kg (154 lb 15.7 oz) (08/13 0300)  Physical Examination: Gen: somnolent on vent, comfortable, arouses to voice HEENT: NCAT, PERRL, EOMi, OP clear, neck supple without masses PULM: Insp crackles L base, wheezing bilaterally CV: RRR, no mgr, no JVD AB: BS+, soft, nontender, no hsm Ext: warm, trace edema, no clubbing, no cyanosis Derm: no rash or skin breakdown Neuro: Somnolent but arouses, maew, follows simple commands   Principal Problem:  *NSTEMI (non-ST elevated myocardial infarction) Active Problems:  TOBACCO USER  GASTROESOPHAGEAL REFLUX DISEASE  COPD  exacerbation  Pulmonary edema  04/22/12 ED labs: PT INR 1.1 U/A negative WBC 8.8, H/H 14.1/43, PLT 193 BMET Na 139, K 3.9, Cl 99, CO2 32, BUN 18, Cr 0.97, Glu 118 CK 38, CK MB 5.8 Trop 1.35 ABG 7.26 /72/74/94.8 BNP 2100 EtOH < 5  ASSESSMENT AND PLAN  PULMONARY No results found for this basename: PHART:5,PCO2:5,PCO2ART:5,PO2ART:5,HCO3:5,O2SAT:5 in the last 168 hours Ventilator Settings: Vent Mode:  [-]  FiO2 (%):  [50 %] 50 % 04/23/12 CXR portable >> emphysema; bibasilar airspace disease improved from  04/22/12 ER film ETT:  n/a  A:  AE COPD, hypercapnic respiratory failure, Pulmonary edema in setting of NSTEMI; more hypercapnic on arrival here, likely due to high FiO2 P:   -BIPAP now, lower FiO2, target O2 saturation 92% -if no improvement in mental status will need to intubate for airway protection and resp insufficiency -solumedrol -moxi -a/a nebs -see cardiology  CARDIOVASCULAR  Lab 04/18/12 1924  TROPONINI <0.30  LATICACIDVEN --  PROBNP 16764.0*   ECG:  NSR, no ST wave changes Lines: PIV  A: NSTEMI in setting of AE COPD; Acute pulmonary edema P:  -per cardiology -lasix per cardiology -heparin gtt -hold b-blocker with wheezing  RENAL  Lab 04/18/12 1924  NA 143  K 3.9  CL 107  CO2 30  BUN 14  CREATININE 0.68  CALCIUM 9.1  MG --  PHOS --   Intake/Output    None    Foley:  8/13  A:  No acute issues P:   -lasix per cardiology -follow uop  GASTROINTESTINAL No results found for this basename: AST:5,ALT:5,ALKPHOS:5,BILITOT:5,PROT:5,ALBUMIN:5 in the last 168 hours  A:  GERD P:   -pepcid for SUP prophylaxis  HEMATOLOGIC  Lab 04/18/12 1924  HGB 14.7  HCT 45.8  PLT 260  INR --  APTT --   A:  No acute issues P:  -monitor cbc  INFECTIOUS  Lab 04/18/12 1924  WBC 10.4  PROCALCITON --   Cultures:   Antibiotics: -04/23/12 Moxi (AE COPD) >>  A:  AE COPD with tracheobronchitis, no other clear infection P:   -Moxi as above -culture  if febrile  ENDOCRINE No results found for this basename: GLUCAP:5 in the last 168 hours A:  No acute issues  P:   -monitor glucose on steroids  NEUROLOGIC  A:  Acute encephalopathy due to hypercapnic respiratory failure, possibly related to benzo overdose?  Unclear history.  Need to clarify with family. P:   -Treat Hypercapnea as above -frequent orientation -may need intubation for airway protection  BEST PRACTICE / DISPOSITION Level of Care:  ICU Primary Service:  PCCM Consultants:  Cardiology Code Status:  full Diet:  npo DVT Px:  Hep gtt GI Px:  pepcid Social / Family:    CC time 45 minutes  MCQUAID, DOUGLAS, M.D. Pulmonary and Critical Care Medicine [employer] Pager: [phone]  04/23/2012, 3:25 AM

## 2012-04-23 NOTE — Progress Notes (Signed)
Name: Donna [name] MRN: [id number] DOB: 14-Sep-1955    LOS: 0  Referring Provider:  Maryruth Bun EDP Reason for Referral:  AE COPD, NSTEMI  PULMONARY / CRITICAL CARE MEDICINE  HPI:  This is a 56 y/o female with COPD and possible CAD (had a normal LHC in 2011) who was recently discharged from an outside hospital for a COPD exacerbation came back to the [facility] ED on 04/22/12 with worsening shortness of breath, confusion and chest pain.  She was found to have a respiratory acidosis and an elevated troponin.  She was placed on BIPAP, given solumedrol and lasix and transferred to [facility] for further evaluation.  Intubated on 8/13 5 AM.  Vital Signs: Temp:  [97.7 F (36.5 C)-99.4 F (37.4 C)] 99.4 F (37.4 C) (08/13 0800) Pulse Rate:  [65-81] 71  (08/13 0900) Resp:  [12-20] 18  (08/13 0900) BP: (103-142)/(35-73) 113/64 mmHg (08/13 0900) SpO2:  [87 %-98 %] 90 % (08/13 0900) FiO2 (%):  [30 %-50 %] 50 % (08/13 0900) Weight:  [70.3 kg (154 lb 15.7 oz)] 70.3 kg (154 lb 15.7 oz) (08/13 0300)  Physical Examination: Gen: somnolent on vent, comfortable, arouses to voice HEENT: NCAT, PERRL, EOMi, OP clear, neck supple without masses PULM: Insp crackles L base, wheezing bilaterally CV: RRR, no mgr, no JVD AB: BS+, soft, nontender, no hsm Ext: warm, trace edema, no clubbing, no cyanosis Derm: no rash or skin breakdown Neuro: Somnolent but arouses, maew, follows simple commands  Principal Problem:  *NSTEMI (non-ST elevated myocardial infarction) Active Problems:  TOBACCO USER  GASTROESOPHAGEAL REFLUX DISEASE  COPD exacerbation  Pulmonary edema  04/22/12 ED labs: PT INR 1.1 U/A negative WBC 8.8, H/H 14.1/43, PLT 193 BMET Na 139, K 3.9, Cl 99, CO2 32, BUN 18, Cr 0.97, Glu 118 CK 38, CK MB 5.8 Trop 1.35 ABG 7.26 /72/74/94.8 BNP 2100 EtOH < 5  ASSESSMENT AND PLAN  PULMONARY  Lab 04/23/12 0544 04/23/12 0347  PHART 7.349* 7.243*  PCO2ART 59.2* 86.1*  PO2ART 61.0* 125.0*  HCO3 32.8* 37.1*    O2SAT 90.0 98.0   Ventilator Settings: Vent Mode:  [-] PRVC FiO2 (%):  [30 %-50 %] 50 % Set Rate:  [18 bmp] 18 bmp Vt Set:  [400 mL] 400 mL PEEP:  [5 cmH20] 5 cmH20 Plateau Pressure:  [15 cmH20] 15 cmH20 04/23/12 CXR portable >> emphysema; bibasilar airspace disease improved from  04/22/12 ER film ETT:  n/a  A:  AE COPD, hypercapnic respiratory failure, Pulmonary edema in setting of NSTEMI; more hypercapnic on arrival here, likely due to high FiO2 P:   -Intubated, will maintain on full support for now. -Solumedrol at 80 mg IV q8 hours. -Moxi -A/A nebs -See cardiology section  CARDIOVASCULAR  Lab 04/23/12 0327 04/18/12 1924  TROPONINI 0.75* <0.30  LATICACIDVEN -- --  PROBNP 19352.0* 16764.0*   ECG:  NSR, no ST wave changes Lines: PIV  A: NSTEMI in setting of AE COPD; Acute pulmonary edema P:  -Per cardiology -Lasix per renal section -Heparin gtt -Hold b-blocker with wheezing -NTG drip -2D echo ordered  RENAL  Lab 04/23/12 0427 04/18/12 1924  NA 143 143  K 4.0 3.9  CL 99 107  CO2 34* 30  BUN 15 14  CREATININE 0.72 0.68  CALCIUM 8.7 9.1  MG -- --  PHOS -- --   Intake/Output      08/12 0701 - 08/13 0700 08/13 0701 - 08/14 0700   I.V. (mL/kg) 153 (2.2) 34 (0.5)   IV Piggyback  300    Total Intake(mL/kg) 453 (6.4) 34 (0.5)   Urine (mL/kg/hr) 800 (0.5)    Total Output 800    Net -347 +34          Intake/Output Summary (Last 24 hours) at 04/23/12 1047 Last data filed at 04/23/12 0800  Gross per 24 hour  Intake    487 ml  Output    800 ml  Net   -313 ml   Foley:  8/13  A:  No acute issues P:   -Lasix 20 q6 x3 doses -Potassium replacement -Recheck BMET in AM.  GASTROINTESTINAL No results found for this basename: AST:5,ALT:5,ALKPHOS:5,BILITOT:5,PROT:5,ALBUMIN:5 in the last 168 hours  A:  GERD P:   -pepcid for SUP prophylaxis -TF per nutrition  HEMATOLOGIC  Lab 04/23/12 0427 04/18/12 1924  HGB 15.1* 14.7  HCT 47.2* 45.8  PLT 226 260  INR  -- --  APTT -- --   A:  No acute issues P:  -monitor cbc  INFECTIOUS  Lab 04/23/12 0427 04/18/12 1924  WBC 6.5 10.4  PROCALCITON <0.10 --   Cultures:   Antibiotics: -04/23/12 Moxi (AE COPD) >>  A:  AE COPD with tracheobronchitis, no other clear infection P:   -Moxi as above -culture if febrile  ENDOCRINE  Lab 04/23/12 0802 04/23/12 0417  GLUCAP 121* 149*   A:  No acute issues  P:   -monitor glucose on steroids  NEUROLOGIC  A:  Acute encephalopathy due to hypercapnic respiratory failure, possibly related to benzo overdose?  Unclear history.  Need to clarify with family. P:   -Treat Hypercapnea as above -frequent orientation -may need intubation for airway protection  CC time 45 minutes  Alyson Reedy, M.D. [facility] Pulmonary/Critical Care Medicine. Pager: [phone]. After hours pager: [phone].

## 2012-04-23 NOTE — Consult Note (Signed)
CARDIOLOGY CONSULT NOTE  Patient ID: Donna [name], MRN: [id number], DOB/AGE: [birth year] 56 y.o. Admit date: 04/23/2012 Date of Consult: 04/23/2012  Primary Physician: Louie Boston, MD Primary Cardiologist: Seen by [facility] G[facility] in Past  Chief Complaint: Shortness of breath Reason for Consultation: +Troponin  HPI: 56 y.o. female w/ PMHx significant for COPD who presented to [facility] on 04/23/2012 as a transfer from [facility]. Patient is unable to provide history due to bipap and altered mental status (hypercarbia). History is from verbal communication and chart notes.  Per the PA at [facility], the patient had been hospitalized there at the end of July when she was admitted with a hypotensive episode. Was found to have had elevated troponin there and consulted on by Dr. Diona Browner of [facility] (notes not available) but was felt to be demand ischemia and that the normal cath from 2011 was reassuring. Echo done showed mod LVH, ef 60-65%, mild AV sclerosis, PA pressures not able to be measured. Pt discharged home. Seen in the ED at [facility] on 8/8 with feet swelling. CTA negative for PE and discharged home.   Seen at [facility] 8/12 with hypoxia and altered mental status. Started on Bipap, diuresed and found to have a troponin of 1.35. Due to elevated troponin, requested transfer to [facility].  Currently, pt is resting with bipap on. She responds to name but otherwise, communication is not possible.  Past Medical History  Diagnosis Date      . Degenerative disc disease   . Asthma   . Lupus   . Hepatitis C     Chart h/o polysubstance abuse COPD Continued tobacco abuse  Surgical History:  Past Surgical History  Procedure Date  . Cholecystectomy   . Appendectomy   . Tonsillectomy   . Tubal ligation   . Polyps on vocal cords      Home Meds: Prior to Admission medications   Medication Sig Start Date End Date Taking? Authorizing Provider  acetaminophen (TYLENOL) 500 MG  tablet Take 500 mg by mouth every 6 (six) hours as needed.    Historical Provider, MD  albuterol (PROAIR HFA) 108 (90 BASE) MCG/ACT inhaler Inhale 2 puffs into the lungs every 6 (six) hours as needed.    Historical Provider, MD  ALPRAZolam Prudy Feeler) 1 MG tablet Take 1 mg by mouth at bedtime as needed.    Historical Provider, MD  cetirizine (ZYRTEC) 10 MG tablet Take 10 mg by mouth daily.    Historical Provider, MD  HYDROcodone-acetaminophen (NORCO) 10-325 MG per tablet Take 1 tablet by mouth every 6 (six) hours as needed.    Historical Provider, MD  metoprolol tartrate (LOPRESSOR) 25 MG tablet Take 12.5 mg by mouth 2 (two) times daily.    Historical Provider, MD  pantoprazole (PROTONIX) 40 MG tablet Take 40 mg by mouth daily.    Historical Provider, MD  PARoxetine (PAXIL) 20 MG tablet Take 20 mg by mouth daily.    Historical Provider, MD  predniSONE (STERAPRED UNI-PAK) 10 MG tablet Take 10 mg by mouth daily. For 6 days. (6,5,4,3,2,1)    Historical Provider, MD    Inpatient Medications:    . ipratropium  0.5 mg Nebulization Q4H   And  . albuterol  2.5 mg Nebulization Q4H  . aspirin  324 mg Oral NOW   Or  . aspirin  300 mg Rectal NOW  . famotidine (PEPCID) IV  20 mg Intravenous Q12H  . methylPREDNISolone (SOLU-MEDROL) injection  80 mg Intravenous Q12H  .  sodium chloride  3 mL Intravenous Q12H      Allergies:  Allergies  Allergen Reactions  . Corn-Containing Products Shortness Of Breath and Swelling    REACTION: affects breathing  . Latex Shortness Of Breath, Itching and Rash  . Codeine Itching and Nausea And Vomiting  . Penicillins Other (See Comments)    REACTION: "thrush"  . Sulfa Antibiotics Other (See Comments)    REACTION: Unknown    History   Social History  . Marital Status: Widowed    Spouse Name: N/A    Number of Children: N/A  . Years of Education: N/A   Occupational History  . Not on file.   Social History Main Topics  . Smoking status: Current Everyday Smoker   . Smokeless tobacco: Not on file  . Alcohol Use: Yes     occ  . Drug Use: No  . Sexually Active:    Other Topics Concern  . Not on file   Social History Narrative  . No narrative on file     FH: Not available for questioning  Review of Systems: unable to obtain due to drowsiness  Labs: PT INR 1.1  U/A negative  WBC 8.8, H/H 14.1/43, PLT 193  BMET Na 139, K 3.9, Cl 99, CO2 32, BUN 18, Cr 0.97, Glu 118  CK 38, CK MB 5.8 Trop 1.35  ABG 7.26 /72/74/94.8  BNP 2100  EtOH < 5   Radiology/Studies:   Ct Angio Chest Pe W/cm &/or Wo Cm  04/18/2012  *RADIOLOGY REPORT*  Clinical Data: Oxygen saturation of 75%.  Bilateral foot swelling. Smoker.  D-dimer 0.57.  CT ANGIOGRAPHY CHEST  Technique:  Multidetector CT imaging of the chest using the standard protocol during bolus administration of intravenous contrast. Multiplanar reconstructed images including MIPs were obtained and reviewed to evaluate the vascular anatomy.  Contrast: OMNIPAQUE IOHEXOL 350 MG/ML SOLN  Comparison: None.  Findings: Technically adequate study with good opacification of the central and segmental pulmonary arteries.  No focal filling defects.  No evidence of significant pulmonary embolus.  The pulmonary arteries diffusely demonstrate prominent diameter consistent with pulmonary arterial hypertension.  Normal caliber thoracic aorta without evidence of dissection.  Normal heart size. Mild prominence of mediastinal lymph nodes without pathologic enlargement.  These are likely reactive.  The esophagus is decompressed.  No abnormal mediastinal fluid collections.  Visualization of the lung fields is limited due to respiratory motion artifact but there is diffuse emphysematous change throughout the lungs with diffuse interstitial fibrosis.  No focal consolidation.  No pneumothorax.  Airways appear patent with mild airway thickening suggesting chronic bronchitis.  Degenerative changes in the thoracic spine.  The configuration of  the liver demonstrates prominence of the lateral segment of the left lobe with nodular contour suggesting changes of cirrhosis.  The spleen size appears normal.  IMPRESSION: No evidence of significant pulmonary embolus.  Dilated pulmonary arteries suggesting pulmonary arterial hypertension.  Diffuse emphysematous changes and fibrosis in the lungs.  Chronic bronchitic changes.  Incidental note of prominence of the lateral segment left lobe of the liver with mild nodular hepatic contour suggesting cirrhosis.  Original Report Authenticated By: Marlon Pel, M.D.    EKG: sinus, TWI in V1, V2 --> new compared to prior  Physical Exam: Blood pressure 108/62, pulse 74, temperature 97.7 F (36.5 C), temperature source Oral, resp. rate 12, height 5\' 2"  (1.575 m), weight 70.3 kg (154 lb 15.7 oz), SpO2 98.00%. General: resting with bipap Head: Normocephalic, atraumatic,  Neck:  Supple. Negative for carotid bruits. JVD uninterpretable due to bipap mask Lungs: diffuse wheezing Heart: RRR with S1 S2. No murmurs, rubs, or gallops appreciated. Abdomen: Soft, non-tender, non-distended with normoactive bowel sounds. No hepatomegaly. No rebound/guarding. No obvious abdominal masses. Msk:  Strength and tone appear normal for age. Extremities: No clubbing or cyanosis. No edema.  Distal pedal pulses are 2+ and equal bilaterally. Neuro: No gross deficits Psych:  Responds to name but continues to be alterd.   Assessment and Plan:  56 yo with COPD, tobacco abuse presenting with shortness of breath, evidence of fluid overload found to be extremely hypercarbic with a mildly elevated troponin.   EKG is also mildly abnormal with new TWI though in the setting of LVH, specificity of this finding is less.  One could argue that a clean cath from 2011 argues that obstructive coronary disease as the cause of her elevated troponin is unlikely. Demand ischemia in the setting of LVH can also cause troponemia and is high on  the differential. Due to her respiratory status, decision to pursue invasive vs. Noninvasive strategy to investigate for CAD can be deferred for now.  Continue ACS regimen of ASA, heparin. Holding BB currently due to COPD. Nitro gtt can be weaned unless needed for blood pressure or symptoms.   Summary of Recs: -continue heparin gtt for now -continue ASA -wean nitro gtt for symptoms/blood pressure -continue to cycle troponins to ascertain peak  Thank you to the Pulm/Critical Care for assistance with this patient. [place] [facility] will follow up.  Signed, Adolm Joseph, Easter Kennebrew C. MD 04/23/2012, 3:58 AM

## 2012-04-23 NOTE — Progress Notes (Signed)
Patient's sister  Lambert Keto updated on patient's status including intubation, sedation and restraints.  Phone contact:  [phone].  Code word established.

## 2012-04-23 NOTE — Progress Notes (Signed)
  Echocardiogram 2D Echocardiogram has been performed.  Georgian Co 04/23/2012, 2:00 PM

## 2012-04-24 ENCOUNTER — Inpatient Hospital Stay (HOSPITAL_COMMUNITY): Payer: Medicare Other

## 2012-04-24 DIAGNOSIS — I214 Non-ST elevation (NSTEMI) myocardial infarction: Secondary | ICD-10-CM

## 2012-04-24 LAB — BASIC METABOLIC PANEL
CO2: 32 mEq/L (ref 19–32)
Calcium: 8 mg/dL — ABNORMAL LOW (ref 8.4–10.5)
Creatinine, Ser: 0.78 mg/dL (ref 0.50–1.10)
GFR calc Af Amer: >90 mL/min (ref >90)
GFR calc non Af Amer: >90 mL/min (ref >90)
Sodium: 145 mEq/L (ref 135–145)

## 2012-04-24 LAB — CBC
MCH: 32.2 pg (ref 26.0–34.0)
MCHC: 32.2 g/dL (ref 30.0–36.0)
Platelets: 224 10*3/uL (ref 150–400)
RBC: 4.22 MIL/uL (ref 3.87–5.11)
RDW: 14.6 % (ref 11.5–15.5)

## 2012-04-24 LAB — GLUCOSE, CAPILLARY
Glucose-Capillary: 189 mg/dL — ABNORMAL HIGH (ref 70–99)
Glucose-Capillary: 157 mg/dL — ABNORMAL HIGH (ref 70–99)
Glucose-Capillary: 161 mg/dL — ABNORMAL HIGH (ref 70–99)

## 2012-04-24 LAB — PHOSPHORUS: Phosphorus: 2.1 mg/dL — ABNORMAL LOW (ref 2.3–4.6)

## 2012-04-24 LAB — MAGNESIUM: Magnesium: 1.3 mg/dL — ABNORMAL LOW (ref 1.5–2.5)

## 2012-04-24 MED ORDER — K PHOS MONO-SOD PHOS DI & MONO 155-852-130 MG PO TABS
500.0000 mg | ORAL_TABLET | Freq: Three times a day (TID) | ORAL | Status: DC
  Administered 2012-04-24 – 2012-04-25 (×3): 500 mg via ORAL
  Filled 2012-04-24 (×5): qty 2

## 2012-04-24 MED ORDER — HEPARIN (PORCINE) IN NACL 100-0.45 UNIT/ML-% IJ SOLN
1350.0000 [IU]/h | INTRAMUSCULAR | Status: DC
  Filled 2012-04-24 (×2): qty 250

## 2012-04-24 MED ORDER — INSULIN GLARGINE 100 UNIT/ML ~~LOC~~ SOLN: 5.0000 [IU] | Freq: Two times a day (BID) | SUBCUTANEOUS | Status: DC

## 2012-04-24 MED ORDER — METHYLPREDNISOLONE SODIUM SUCC 40 MG IJ SOLR
40.0000 mg | Freq: Two times a day (BID) | INTRAMUSCULAR | Status: DC
  Administered 2012-04-24 – 2012-04-25 (×2): 40 mg via INTRAVENOUS
  Filled 2012-04-24 (×4): qty 1

## 2012-04-24 MED ORDER — PAROXETINE HCL 20 MG PO TABS
20.0000 mg | ORAL_TABLET | Freq: Every day | ORAL | Status: DC
  Administered 2012-04-24 – 2012-04-25 (×2): 20 mg via ORAL
  Filled 2012-04-24 (×3): qty 1

## 2012-04-24 MED ORDER — POTASSIUM CHLORIDE CRYS ER 20 MEQ PO TBCR
40.0000 meq | EXTENDED_RELEASE_TABLET | Freq: Once | ORAL | Status: AC
  Administered 2012-04-24: 40 meq via ORAL
  Filled 2012-04-24: qty 2

## 2012-04-24 MED ORDER — ALPRAZOLAM 0.25 MG PO TABS
0.2500 mg | ORAL_TABLET | Freq: Three times a day (TID) | ORAL | Status: DC | PRN
  Administered 2012-04-24 – 2012-04-25 (×3): 0.25 mg via ORAL
  Filled 2012-04-24 (×5): qty 1

## 2012-04-24 MED ORDER — ALBUTEROL SULFATE (5 MG/ML) 0.5% IN NEBU
2.5000 mg | INHALATION_SOLUTION | RESPIRATORY_TRACT | Status: DC
  Administered 2012-04-24 – 2012-04-26 (×10): 2.5 mg via RESPIRATORY_TRACT
  Filled 2012-04-24 (×10): qty 0.5

## 2012-04-24 MED ORDER — HYDROCODONE-ACETAMINOPHEN 10-325 MG PO TABS
1.0000 | ORAL_TABLET | Freq: Four times a day (QID) | ORAL | Status: DC | PRN
  Administered 2012-04-24 – 2012-04-26 (×6): 1 via ORAL
  Filled 2012-04-24 (×6): qty 1

## 2012-04-24 MED ORDER — MAGNESIUM SULFATE 40 MG/ML IJ SOLN
2.0000 g | Freq: Once | INTRAMUSCULAR | Status: AC
  Administered 2012-04-24: 2 g via INTRAVENOUS
  Filled 2012-04-24: qty 50

## 2012-04-24 MED ORDER — FUROSEMIDE 10 MG/ML IJ SOLN
INTRAMUSCULAR | Status: AC
  Administered 2012-04-24: 20 mg via INTRAVENOUS
  Filled 2012-04-24: qty 4

## 2012-04-24 MED ORDER — FUROSEMIDE 10 MG/ML IJ SOLN
INTRAMUSCULAR | Status: AC
  Filled 2012-04-24: qty 4

## 2012-04-24 MED ORDER — FUROSEMIDE 10 MG/ML IJ SOLN
20.0000 mg | Freq: Four times a day (QID) | INTRAMUSCULAR | Status: AC
  Administered 2012-04-24 (×3): 20 mg via INTRAVENOUS
  Filled 2012-04-24: qty 2

## 2012-04-24 MED ORDER — IPRATROPIUM BROMIDE 0.02 % IN SOLN
0.5000 mg | RESPIRATORY_TRACT | Status: DC
  Administered 2012-04-24 – 2012-04-26 (×10): 0.5 mg via RESPIRATORY_TRACT
  Filled 2012-04-24 (×10): qty 2.5

## 2012-04-24 MED ORDER — POTASSIUM CHLORIDE 20 MEQ/15ML (10%) PO LIQD
40.0000 meq | Freq: Three times a day (TID) | ORAL | Status: DC
  Filled 2012-04-24 (×2): qty 30

## 2012-04-24 MED ORDER — NITROGLYCERIN 0.4 MG SL SUBL
SUBLINGUAL_TABLET | SUBLINGUAL | Status: AC
  Administered 2012-04-24: 23:00:00
  Filled 2012-04-24: qty 25

## 2012-04-24 MED ORDER — LORAZEPAM 2 MG/ML IJ SOLN
0.5000 mg | INTRAMUSCULAR | Status: DC | PRN
  Administered 2012-04-24 (×2): 1 mg via INTRAVENOUS
  Filled 2012-04-24 (×2): qty 1

## 2012-04-24 MED ORDER — MAGNESIUM SULFATE BOLUS VIA INFUSION
2.0000 g | Freq: Once | INTRAVENOUS | Status: DC
  Filled 2012-04-24 (×2): qty 500

## 2012-04-24 NOTE — Progress Notes (Signed)
ANTICOAGULATION CONSULT NOTE  Pharmacy Consult for heparin  Indication: chest pain/ACS  Allergies  Allergen Reactions  . Corn-Containing Products Shortness Of Breath and Swelling    REACTION: affects breathing  . Latex Shortness Of Breath, Itching and Rash  . Codeine Itching and Nausea And Vomiting  . Penicillins Other (See Comments)    REACTION: "thrush"  . Sulfa Antibiotics Other (See Comments)    REACTION: Unknown    Patient Measurements: Height: 5\' 2"  (157.5 cm) Weight: 154 lb 12.2 oz (70.2 kg) IBW/kg (Calculated) : 50.1  Heparin Dosing Weight: 65 kg  Vital Signs: Temp: 98.2 F (36.8 C) (08/14 1620) Temp src: Oral (08/14 1620) BP: 124/59 mmHg (08/14 1600) Pulse Rate: 96  (08/14 1620)  Labs:  Basename 04/24/12 1448 04/24/12 0607 04/23/12 1900 04/23/12 1127 04/23/12 0427 04/23/12 0327  HGB -- 13.6 -- -- 15.1* --  HCT -- 42.3 -- -- 47.2* --  PLT -- 224 -- -- 226 --  APTT -- -- -- -- -- --  LABPROT -- -- -- -- -- --  INR -- -- -- -- -- --  HEPARINUNFRC 0.34 <0.10* 0.41 -- -- --  CREATININE -- 0.78 -- -- 0.72 --  CKTOTAL -- -- 29 31 -- 43  CKMB -- -- 4.2* 5.1* -- 6.0*  TROPONINI -- -- 0.73* 1.10* -- 0.75*    Estimated Creatinine Clearance: 71.2 ml/min (by C-G formula based on Cr of 0.78).   Assessment: 56 yo female with NSTEMI on heparin which is therapeutic (0.34)  on 1300 units/hr.  RN did report some hematuria prior to heparin level being drawn- this has now resolved.  CBC stable this am.   Goal of Therapy:  Heparin level 0.3-0.7 units/ml Monitor platelets by anticoagulation protocol: Yes   Plan:  Increase to 1350 units/hr to keep in goal. Recheck level in AM to confirm.   Link Snuffer, PharmD, BCPS Clinical Pharmacist [phone] 04/24/2012, 4:55 PM

## 2012-04-24 NOTE — Clinical Social Work Psychosocial (Signed)
     Clinical Social Work Department BRIEF PSYCHOSOCIAL ASSESSMENT 04/24/2012  Patient:  Donna [name], Donna [name]     Account Number:  [id number]     Admit date:  04/23/2012  Clinical Social Worker:  Hulan Fray  Date/Time:  04/24/2012 03:54 PM  Referred by:  RN  Date Referred:  04/24/2012 Referred for  Other - See comment   Other Referral:   family had concerns about home situation   Interview type:  Patient Other interview type:   son, Trinna Post and sister    PSYCHOSOCIAL DATA Living Status:  ALONE Admitted from facility:   Level of care:   Primary support name:  Donna [name] Primary support relationship to patient:  CHILD, ADULT Degree of support available:   adequate    CURRENT CONCERNS Current Concerns  None Noted   Other Concerns:    SOCIAL WORK ASSESSMENT / PLAN Clinical Social Worker and Case Manager were approached by unit RN about concerns the patient's son had regarding patient going home alone. Son spoke with CSW and CM stating that the patient "uses drugs and smokes with her oxygen tank in her apartment." Son stated that he is a recovering addict and purposely stays away from his mothers apartment because she has "drug dealers" around her apartment. Patient's sister reported that the patient has been admitted into the hospital several times this year and is very concerned about patient. CSW and CM spoke with patient regarding plans at discharge and patient adamently refuses SNF placement. Patient is open to home health services. Patient stated that she is active with [facility].   Assessment/plan status:  No Further Intervention Required Other assessment/ plan:   Information/referral to community resources:   None needed at this time    PATIENTS/FAMILYS RESPONSE TO PLAN OF CARE: Patient refuses snf placement, but is agreeable to home health services.

## 2012-04-24 NOTE — Progress Notes (Signed)
Name: Donna [name] MRN: [id number] DOB: [birth year]    LOS: 1  Referring Provider:  Maryruth Bun EDP Reason for Referral:  AE COPD, NSTEMI  PULMONARY / CRITICAL CARE MEDICINE  HPI:  This is a 56 y/o female with COPD and possible CAD (had a normal LHC in 2011) who was recently discharged from an outside hospital for a COPD exacerbation came back to the [facility] ED on 04/22/12 with worsening shortness of breath, confusion and chest pain.  She was found to have a respiratory acidosis and an elevated troponin.  She was placed on BIPAP, given solumedrol and lasix and transferred to [facility] for further evaluation.  Intubated on 8/13 5 AM.  Vital Signs: Temp:  [98.2 F (36.8 C)-99 F (37.2 C)] 98.8 F (37.1 C) (08/14 0742) Pulse Rate:  [75-100] 100  (08/14 1100) Resp:  [16-31] 19  (08/14 1100) BP: (82-163)/(40-69) 110/52 mmHg (08/14 1100) SpO2:  [89 %-97 %] 91 % (08/14 1100) FiO2 (%):  [40 %-60 %] 40 % (08/14 0843) Weight:  [70.2 kg (154 lb 12.2 oz)] 70.2 kg (154 lb 12.2 oz) (08/14 0600)  Physical Examination: Gen: Arousable and nervous. HEENT: NCAT, PERRL, EOMi, OP clear, neck supple without masses. PULM: Insp crackles L base, wheezing bilaterally. CV: RRR, no mgr, no JVD. AB: BS+, soft, nontender, no hsm. Ext: warm, trace edema, no clubbing, no cyanosis. Derm: no rash or skin breakdown. Neuro: Somnolent but arouses, maew, follows simple commands.  Principal Problem:  *NSTEMI (non-ST elevated myocardial infarction) Active Problems:  TOBACCO USER  GASTROESOPHAGEAL REFLUX DISEASE  COPD exacerbation  Pulmonary edema  Acute respiratory failure  04/22/12 ED labs: PT INR 1.1 U/A negative WBC 8.8, H/H 14.1/43, PLT 193 BMET Na 139, K 3.9, Cl 99, CO2 32, BUN 18, Cr 0.97, Glu 118 CK 38, CK MB 5.8 Trop 1.35 ABG 7.26 /72/74/94.8 BNP 2100 EtOH < 5  ASSESSMENT AND PLAN  PULMONARY  Lab 04/23/12 0544 04/23/12 0347  PHART 7.349* 7.243*  PCO2ART 59.2* 86.1*  PO2ART 61.0* 125.0*  HCO3  32.8* 37.1*  O2SAT 90.0 98.0   Ventilator Settings: Vent Mode:  [-] PSV FiO2 (%):  [40 %-60 %] 40 % Set Rate:  [18 bmp] 18 bmp Vt Set:  [400 mL] 400 mL PEEP:  [5 cmH20] 5 cmH20 Pressure Support:  [5 cmH20] 5 cmH20 Plateau Pressure:  [13 cmH20-15 cmH20] 13 cmH20 04/23/12 CXR portable >> emphysema; bibasilar airspace disease improved from  04/22/12 ER film ETT:  n/a  A:  AE COPD, hypercapnic respiratory failure, Pulmonary edema in setting of NSTEMI; more hypercapnic on arrival here, likely due to high FiO2 P:   -Intubated but tolerating wean well. -Decrease Solumedrol at 40 mg IV q12 hours. -Moxi -A/A nebs -See cardiology section -Lasix 20 mg IV x6  CARDIOVASCULAR  Lab 04/23/12 1900 04/23/12 1127 04/23/12 0327 04/18/12 1924  TROPONINI 0.73* 1.10* 0.75* <0.30  LATICACIDVEN -- -- -- --  PROBNP -- -- 19352.0* 16764.0*   ECG:  NSR, no ST wave changes Lines: R IJ TLC 8/13>>>  A: NSTEMI in setting of AE COPD; Acute pulmonary edema P:  -Per cardiology -Lasix per renal section -Heparin gtt -Hold b-blocker with wheezing -2D echo ordered and pending.  RENAL  Lab 04/24/12 0607 04/23/12 0427 04/18/12 1924  NA 145 143 143  K 4.0 4.0 --  CL 104 99 107  CO2 32 34* 30  BUN 27* 15 14  CREATININE 0.78 0.72 0.68  CALCIUM 8.0* 8.7 9.1  MG 1.3* -- --  PHOS 2.1* -- --   Intake/Output      08/13 0701 - 08/14 0700 08/14 0701 - 08/15 0700   I.V. (mL/kg) 1491.9 (21.3) 85 (1.2)   NG/GT 635 25   IV Piggyback 60 50   Total Intake(mL/kg) 2186.9 (31.2) 160 (2.3)   Urine (mL/kg/hr) 1825 (1.1) 200 (0.5)   Total Output 1825 200   Net +361.9 -40          Intake/Output Summary (Last 24 hours) at 04/24/12 1211 Last data filed at 04/24/12 1127  Gross per 24 hour  Intake   2041 ml  Output   1875 ml  Net    166 ml   Foley:  8/13  A:  No acute issues P:   -Lasix 20 q6 x3 doses -Potassium and mag replacement -Recheck BMET in AM.  GASTROINTESTINAL No results found for this  basename: AST:5,ALT:5,ALKPHOS:5,BILITOT:5,PROT:5,ALBUMIN:5 in the last 168 hours  A:  GERD P:   -pepcid for SUP prophylaxis -TF per nutrition  HEMATOLOGIC  Lab 04/24/12 0607 04/23/12 0427 04/18/12 1924  HGB 13.6 15.1* 14.7  HCT 42.3 47.2* 45.8  PLT 224 226 260  INR -- -- --  APTT -- -- --   A:  No acute issues P:  -monitor cbc  INFECTIOUS  Lab 04/24/12 0607 04/23/12 0427 04/18/12 1924  WBC 13.6* 6.5 10.4  PROCALCITON -- <0.10 --   Cultures:   Antibiotics: -04/23/12 Moxi (AE COPD) >>  A:  AE COPD with tracheobronchitis, no other clear infection P:   -Moxi as above -culture if febrile  ENDOCRINE  Lab 04/24/12 0745 04/24/12 0406 04/23/12 2324 04/23/12 1950 04/23/12 1612  GLUCAP 141* 189* 185* 152* 139*   A:  No acute issues  P:   -monitor glucose on steroids  NEUROLOGIC  A:  Acute encephalopathy due to hypercapnic respiratory failure, possibly related to benzo overdose?  Unclear history.  Need to clarify with family. P:   -Treat Hypercapnea as above -frequent orientation -may need intubation for airway protection  CC time 35 minutes  Alyson Reedy, M.D. [facility] Pulmonary/Critical Care Medicine. Pager: [phone]. After hours pager: [phone].

## 2012-04-24 NOTE — Progress Notes (Signed)
225 cc of fentanyl wasted from 250 cc Fentanyl bag.  25 cc of Versed wasted from 50 cc Versed bag. Witnessed per two RN's. Marina Goodell RN and Riccardo Dubin RN

## 2012-04-24 NOTE — Progress Notes (Signed)
Patient ID: Donna [name], female   DOB: [birth year], 56 y.o.   MRN: [id number]    Subjective:  More alert Wants to go home writing on tablet  Objective:  Filed Vitals:   04/24/12 0406 04/24/12 0600 04/24/12 0730 04/24/12 0742  BP:  99/50 96/52   Pulse:  85  79  Temp: 98.2 F (36.8 C)   98.8 F (37.1 C)  TempSrc: Oral   Oral  Resp:  19    Height:      Weight:  154 lb 12.2 oz (70.2 kg)    SpO2:  96%  94%    Intake/Output from previous day:  Intake/Output Summary (Last 24 hours) at 04/24/12 0758 Last data filed at 04/24/12 0600  Gross per 24 hour  Intake 2122.93 ml  Output   1825 ml  Net 297.93 ml    Physical Exam:  Intubated Alert HEENT: normal Neck supple with no adenopathy JVP normal no bruits no thyromegaly Lungs rhonchi  Heart:  S1/S2 no murmur, no rub, gallop or click PMI normal Abdomen: benighn, BS positve, no tenderness, no AAA no bruit.  No HSM or HJR Distal pulses intact with no bruits No edema Neuro non-focal Skin warm and dry    Lab Results: Basic Metabolic Panel:  [facility] 04/24/12 0607 04/23/12 0427  NA 145 143  K 4.0 4.0  CL 104 99  CO2 32 34*  GLUCOSE 161* 141*  BUN 27* 15  CREATININE 0.78 0.72  CALCIUM 8.0* 8.7  MG 1.3* --  PHOS 2.1* --   CBC:  Basename 04/24/12 0607 04/23/12 0427  WBC 13.6* 6.5  NEUTROABS -- --  HGB 13.6 15.1*  HCT 42.3 47.2*  MCV 100.2* 101.7*  PLT 224 226   Cardiac Enzymes:  Basename 04/23/12 1900 04/23/12 1127 04/23/12 0327  CKTOTAL 29 31 43  CKMB 4.2* 5.1* 6.0*  CKMBINDEX -- -- --  TROPONINI 0.73* 1.10* 0.75*   Imaging: Dg Chest Port 1 View  04/24/2012  *RADIOLOGY REPORT*  Clinical Data: Respiratory failure.  PORTABLE CHEST - 1 VIEW  Comparison: 04/23/2012  Findings: Endotracheal tube tip approximately 2 cm above the carina. Stable chronic lung disease and bilateral lower lobe atelectasis.  No overt pulmonary edema.  No significant pleural fluid.  Heart size is stable.  IMPRESSION: Stable  chronic lung disease and bilateral lower lobe atelectasis.  Original Report Authenticated By: Reola Calkins, M.D.   Portable Chest Xray  04/23/2012  *RADIOLOGY REPORT*  Clinical Data: Endotracheal tube placement.  PORTABLE CHEST - 1 VIEW  Comparison: 04/23/2012.  Findings: Interval placement of endotracheal tube with the tip 2 cm from the carina.  Enteric tube has been placed.  The enteric tube is proximal and should be advanced 10 cm for better positioning. Increasing basilar atelectasis.  There may be some basilar airspace disease as well, likely representing pulmonary edema.  IMPRESSION:  1.  Endotracheal tube 2 cm from the carina. 2.  Proximal position of nasogastric tube which should be advanced 10 cm. 3.  Worsening aeration with increasing basilar atelectasis and probable basilar airspace disease/edema.  Original Report Authenticated By: Andreas Newport, M.D.   Portable Chest Xray  04/23/2012  *RADIOLOGY REPORT*  Clinical Data: Congestive heart failure.  Short of breath.  COPD.  PORTABLE CHEST - 1 VIEW  Comparison: 04/22/2012.  Findings: Cardiomegaly.  Basilar interstitial and mild airspace opacity is present superimposed on chronic changes.  Findings are compatible with mild CHF.  Linear subsegmental atelectasis in the retrocardiac region.  No  focal consolidation or effusion. Underlying emphysema.  IMPRESSION: Constellation of findings compatible with mild CHF.  Subsegmental atelectasis at the left lung base.  Original Report Authenticated By: Andreas Newport, M.D.    Cardiac Studies:  ECG:    Telemetry: NSR no arrythmia 04/24/2012   Echo: 7/29 [place] EF 60-65%  Mild to moderate LVH 8/13 ? Of mid cavitary obstruction normal EF no MR  Medications:      . albuterol-ipratropium  6 puff Inhalation Q4H  . antiseptic oral rinse  15 mL Mouth Rinse QID  . aspirin  324 mg Oral NOW   Or  . aspirin  300 mg Rectal NOW  . chlorhexidine  15 mL Mouth Rinse BID  . famotidine (PEPCID) IV  20 mg  Intravenous Q12H  . feeding supplement (OXEPA)  1,000 mL Per Tube Q24H  . feeding supplement  30 mL Per Tube 5 X Daily  . furosemide  20 mg Intravenous Q6H  . lidocaine (cardiac) 100 mg/68ml      . methylPREDNISolone (SOLU-MEDROL) injection  80 mg Intravenous Q12H  . midazolam      . moxifloxacin  400 mg Intravenous Q24H  . multivitamin with minerals  1 tablet Per Tube Daily  . potassium chloride  40 mEq Per Tube TID  . sodium chloride  500 mL Intravenous Once  . sodium chloride  3 mL Intravenous Q12H  . succinylcholine      . DISCONTD: albuterol  6-8 puff Inhalation Q4H  . DISCONTD: albuterol  2.5 mg Nebulization Q4H  . DISCONTD: ipratropium  0.5 mg Nebulization Q4H        . fentaNYL infusion INTRAVENOUS 50 mcg/hr (04/24/12 0529)  . heparin 1,100 Units/hr (04/23/12 1700)  . midazolam (VERSED) infusion 3 mg/hr (04/24/12 0022)    Assessment/Plan:  Cardiac:  Minimal elevation of CPK and troponin not significant  ? ARDS or diastolic dysfunction on top of COPD Continue vent support and steroids.  Diureses as needed  EF normal by echo.  No evidence of acute coronary syndrome I will review echo done yesterday She has no significant murmur and this may be just a volume issue as echo done 2 weeks [facility] at [facility] described no gradients.  Repeat ECG  Enzymes have not really bumped  Charlton Haws 04/24/2012, 7:58 AM

## 2012-04-24 NOTE — Progress Notes (Signed)
eLink Physician-Brief Progress Note Patient Name: Donna [name] DOB: [birth year] MRN: [id number]  Date of Service  04/24/2012   HPI/Events of Note  Concern for BDZ vs ETOH withdrawal   eICU Interventions  Resume xanax prn IV ativan for withdrawal prn   Intervention Category Intermediate Interventions: Medication change / dose adjustment  Maite Burlison V. 04/24/2012, 5:07 PM

## 2012-04-24 NOTE — Care Management Note (Signed)
    Page 1 of 1   04/24/2012     3:59:20 PM   CARE MANAGEMENT NOTE 04/24/2012  Patient:  Donna [name], Donna [name]   Account Number:  [id number]  Date Initiated:  04/23/2012  Documentation initiated by:  Junius Creamer  Subjective/Objective Assessment:   adm  w mi     Action/Plan:   lives alone. act w adv homecare for hhc and thinks her o2 comes from [employer] DC Date:     Anticipated DC Plan:    In-house referral  Clinical Social Worker      DC Associate Professor  CM consult      Choice offered to / List presented to:             Status of service:   Medicare Important Message given?   (If response is "NO", the following Medicare IM given date fields will be blank) Date Medicare IM given:   Date Additional Medicare IM given:    Discharge Disposition:    Per UR Regulation:  Reviewed for med. necessity/level of care/duration of stay  If discussed at Long Length of Stay Meetings, dates discussed:    Comments:  8/14 15:55p debbie Myrtle Haller rn,bsn pt's fam(son-sister-granda) about pt going home alone. pt has hhc and at this time pt refuses snf for rehab. will cont to follow and follow pt's progress. have alerted marie w [facility] of adm.  8/13 9am debbie Aedan Geimer rn,bsn [phone]

## 2012-04-24 NOTE — Procedures (Signed)
Extubation Procedure Note  Patient Details:   Name: Donna [name] DOB: 04-04-1956 MRN: [id number]   Airway Documentation:     Evaluation  O2 sats: stable throughout Complications: No apparent complications Patient did tolerate procedure well. Bilateral Breath Sounds: Expiratory wheezes Suctioning: Airway Yes  Pt tolerated wean, was positive for cuff leak  and was extubated per MD order. Pt extubated to 5lpm [place] and is resting comfortably. RT will continue to monitor.   Parke Poisson 04/24/2012, 10:26 AM

## 2012-04-24 NOTE — Progress Notes (Signed)
ANTICOAGULATION CONSULT NOTE  Pharmacy Consult for heparin  Indication: chest pain/ACS  Allergies  Allergen Reactions  . Corn-Containing Products Shortness Of Breath and Swelling    REACTION: affects breathing  . Latex Shortness Of Breath, Itching and Rash  . Codeine Itching and Nausea And Vomiting  . Penicillins Other (See Comments)    REACTION: "thrush"  . Sulfa Antibiotics Other (See Comments)    REACTION: Unknown    Patient Measurements: Height: 5\' 2"  (157.5 cm) Weight: 154 lb 12.2 oz (70.2 kg) IBW/kg (Calculated) : 50.1  Heparin Dosing Weight: 65 kg  Vital Signs: Temp: 98.2 F (36.8 C) (08/14 0406) Temp src: Oral (08/14 0406) BP: 99/50 mmHg (08/14 0600) Pulse Rate: 85  (08/14 0600)  Labs:  Basename 04/24/12 0607 04/23/12 1900 04/23/12 1127 04/23/12 0427 04/23/12 0327  HGB 13.6 -- -- 15.1* --  HCT 42.3 -- -- 47.2* --  PLT 224 -- -- 226 --  APTT -- -- -- -- --  LABPROT -- -- -- -- --  INR -- -- -- -- --  HEPARINUNFRC <0.10* 0.41 0.58 -- --  CREATININE 0.78 -- -- 0.72 --  CKTOTAL -- 29 31 -- 43  CKMB -- 4.2* 5.1* -- 6.0*  TROPONINI -- 0.73* 1.10* -- 0.75*    Estimated Creatinine Clearance: 71.2 ml/min (by C-G formula based on Cr of 0.78).   Assessment: 56 yo female with NSTEMI on heparin which was therapeutic for last 2 level at 1100/hr. This am however is undetectable with no infusion issues per RN. No bleeding reported.   Goal of Therapy:  Heparin level 0.3-0.7 units/ml Monitor platelets by anticoagulation protocol: Yes   Plan:  Increase to 1300 units/hr and recheck at 2pm

## 2012-04-25 DIAGNOSIS — F329 Major depressive disorder, single episode, unspecified: Secondary | ICD-10-CM

## 2012-04-25 LAB — GLUCOSE, CAPILLARY
Glucose-Capillary: 158 mg/dL — ABNORMAL HIGH (ref 70–99)
Glucose-Capillary: 137 mg/dL — ABNORMAL HIGH (ref 70–99)
Glucose-Capillary: 141 mg/dL — ABNORMAL HIGH (ref 70–99)

## 2012-04-25 LAB — CBC
HCT: 41.8 % (ref 36.0–46.0)
Hemoglobin: 13.2 g/dL (ref 12.0–15.0)
MCH: 31.5 pg (ref 26.0–34.0)
MCHC: 31.6 g/dL (ref 30.0–36.0)
MCV: 99.8 fL (ref 78.0–100.0)

## 2012-04-25 LAB — BASIC METABOLIC PANEL
BUN: 27 mg/dL — ABNORMAL HIGH (ref 6–23)
Calcium: 8.1 mg/dL — ABNORMAL LOW (ref 8.4–10.5)
Creatinine, Ser: 0.69 mg/dL (ref 0.50–1.10)
GFR calc non Af Amer: >90 mL/min (ref >90)
Glucose, Bld: 159 mg/dL — ABNORMAL HIGH (ref 70–99)
Sodium: 144 mEq/L (ref 135–145)

## 2012-04-25 MED ORDER — MAGNESIUM SULFATE 40 MG/ML IJ SOLN
2.0000 g | Freq: Once | INTRAMUSCULAR | Status: AC
  Administered 2012-04-25: 2 g via INTRAVENOUS
  Filled 2012-04-25: qty 50

## 2012-04-25 MED ORDER — BIOTENE DRY MOUTH MT LIQD
15.0000 mL | Freq: Two times a day (BID) | OROMUCOSAL | Status: DC
  Administered 2012-04-25 – 2012-04-26 (×3): 15 mL via OROMUCOSAL

## 2012-04-25 MED ORDER — POTASSIUM CHLORIDE CRYS ER 20 MEQ PO TBCR
40.0000 meq | EXTENDED_RELEASE_TABLET | Freq: Once | ORAL | Status: AC
  Administered 2012-04-25: 40 meq via ORAL
  Filled 2012-04-25: qty 2

## 2012-04-25 MED ORDER — MAGNESIUM SULFATE BOLUS VIA INFUSION: 2.0000 g | Freq: Once | INTRAVENOUS | Status: DC

## 2012-04-25 MED ORDER — RENA-VITE PO TABS
1.0000 | ORAL_TABLET | Freq: Every day | ORAL | Status: DC
  Administered 2012-04-25: 1 via ORAL
  Filled 2012-04-25 (×2): qty 1

## 2012-04-25 MED ORDER — PREDNISONE 20 MG PO TABS
40.0000 mg | ORAL_TABLET | Freq: Every day | ORAL | Status: DC
  Administered 2012-04-26: 40 mg via ORAL
  Filled 2012-04-25 (×2): qty 2

## 2012-04-25 MED ORDER — MOXIFLOXACIN HCL 400 MG PO TABS
400.0000 mg | ORAL_TABLET | Freq: Every day | ORAL | Status: DC
  Administered 2012-04-25: 400 mg via ORAL
  Filled 2012-04-25 (×2): qty 1

## 2012-04-25 MED ORDER — FOLIC ACID 1 MG PO TABS
1.0000 mg | ORAL_TABLET | Freq: Every day | ORAL | Status: DC
  Administered 2012-04-25: 1 mg via ORAL
  Filled 2012-04-25 (×2): qty 1

## 2012-04-25 MED ORDER — VITAMIN B-1 100 MG PO TABS
100.0000 mg | ORAL_TABLET | Freq: Every day | ORAL | Status: DC
  Administered 2012-04-25: 100 mg via ORAL
  Filled 2012-04-25 (×2): qty 1

## 2012-04-25 MED ORDER — FAMOTIDINE 20 MG PO TABS
20.0000 mg | ORAL_TABLET | Freq: Two times a day (BID) | ORAL | Status: DC
  Administered 2012-04-25 (×2): 20 mg via ORAL
  Filled 2012-04-25 (×4): qty 1

## 2012-04-25 NOTE — Progress Notes (Signed)
ANTICOAGULATION CONSULT NOTE  Pharmacy Consult for heparin  Indication: chest pain/ACS  Allergies  Allergen Reactions  . Corn-Containing Products Shortness Of Breath and Swelling    REACTION: affects breathing  . Latex Shortness Of Breath, Itching and Rash  . Codeine Itching and Nausea And Vomiting  . Penicillins Other (See Comments)    REACTION: "thrush"  . Sulfa Antibiotics Other (See Comments)    REACTION: Unknown    Patient Measurements: Height: 5\' 2"  (157.5 cm) Weight: 154 lb 12.2 oz (70.2 kg) IBW/kg (Calculated) : 50.1  Heparin Dosing Weight: 65 kg  Vital Signs: Temp: 98.4 F (36.9 C) (08/15 0738) Temp src: Oral (08/15 0738) BP: 109/66 mmHg (08/15 0700) Pulse Rate: 76  (08/15 0700)  Labs:  Basename 04/25/12 0630 04/24/12 1448 04/24/12 0607 04/23/12 1900 04/23/12 1127 04/23/12 0427 04/23/12 0327  HGB 13.2 -- 13.6 -- -- -- --  HCT 41.8 -- 42.3 -- -- 47.2* --  PLT 222 -- 224 -- -- 226 --  APTT -- -- -- -- -- -- --  LABPROT -- -- -- -- -- -- --  INR -- -- -- -- -- -- --  HEPARINUNFRC 0.35 0.34 <0.10* -- -- -- --  CREATININE -- -- 0.78 -- -- 0.72 --  CKTOTAL -- -- -- 29 31 -- 43  CKMB -- -- -- 4.2* 5.1* -- 6.0*  TROPONINI -- -- -- 0.73* 1.10* -- 0.75*    Estimated Creatinine Clearance: 71.2 ml/min (by C-G formula based on Cr of 0.78).   Assessment: 56 yo female with NSTEMI on heparin which is therapeutic (0.34)  on 1300 units/hr.  RN did report some hematuria prior to heparin level being drawn on 8/14 and this has now resolved.  CBC stable this am.   Goal of Therapy:  Heparin level 0.3-0.7 units/ml Monitor platelets by anticoagulation protocol: Yes   Plan:  -No heparin changes needed -Daily heparin level and CBC  Harland German, Pharm D 04/25/2012 7:58 AM

## 2012-04-25 NOTE — Evaluation (Signed)
Physical Therapy Evaluation Patient Details Name: Donna [name] MRN: [id number] DOB: 1956/06/01 Today's Date: 04/25/2012 Time: 0930-1000 PT Time Calculation (min): 30 min  PT Assessment / Plan / Recommendation Clinical Impression  pt presents with COPD exacerbation and NSTEMI.  pt moving well despite decreasing O2 sats with mobility.  pt with cognitive deficits, but ? if this is baseline?  pt would benefit from increased S at home for safety, but pt very adament about returning home alone.  If pt agreeable, would benefit from HHPT, RN, and Aide.      PT Assessment  Patient needs continued PT services    Follow Up Recommendations  Home health PT;Supervision - Intermittent    Barriers to Discharge Decreased caregiver support      Equipment Recommendations  None recommended by PT    Recommendations for Other Services OT consult   Frequency Min 3X/week    Precautions / Restrictions Precautions Precautions: Fall Precaution Comments: O2 sats Restrictions Weight Bearing Restrictions: No   Pertinent Vitals/Pain Pt notes chronic back and LE pains.  Pt premedicated.        Mobility  Bed Mobility Bed Mobility: Supine to Sit;Sitting - Scoot to Edge of Bed Supine to Sit: 7: Independent Sitting - Scoot to [place] of Bed: 7: Independent Details for Bed Mobility Assistance: pt long sits in bed Independently and begins to scoot to end of bed around bed rail needing cues to wait for PT.   Transfers Transfers: Sit to Stand;Stand to Sit Sit to Stand: 5: Supervision;With upper extremity assist;From bed;From toilet Stand to Sit: 5: Supervision;With upper extremity assist;To toilet;To chair/3-in-1 Details for Transfer Assistance: pt somewhat impulsive and needs cues to slow down and attend to obstacles in room and lines.   Ambulation/Gait Ambulation/Gait Assistance: 5: Supervision Ambulation Distance (Feet): 200 Feet Assistive device:  (Pushing W/C) Ambulation/Gait Assistance Details: cues  for negotiating obstacles and for deep breathing.  pt desats into mid 80's on 5L O2 during ambulating.   Gait Pattern: Step-through pattern;Decreased stride length Stairs: No Wheelchair Mobility Wheelchair Mobility: No    Exercises     PT Diagnosis: Difficulty walking  PT Problem List: Decreased activity tolerance;Decreased balance;Decreased mobility;Decreased cognition;Decreased knowledge of use of DME;Decreased safety awareness;Cardiopulmonary status limiting activity PT Treatment Interventions: DME instruction;Gait training;Functional mobility training;Therapeutic activities;Therapeutic exercise;Balance training;Cognitive remediation;Patient/family education   PT Goals Acute Rehab PT Goals PT Goal Formulation: With patient Time For Goal Achievement: 05/09/12 Potential to Achieve Goals: Good Pt will go Sit to Supine/Side: Independently PT Goal: Sit to Supine/Side - Progress: Goal set today Pt will go Sit to Stand: Independently PT Goal: Sit to Stand - Progress: Goal set today Pt will go Stand to Sit: Independently PT Goal: Stand to Sit - Progress: Goal set today Pt will Ambulate: >150 feet;with modified independence;with least restrictive assistive device PT Goal: Ambulate - Progress: Goal set today  Visit Information  Last PT Received On: 04/25/12 Assistance Needed: +1    Subjective Data  Subjective: Baby, there's nothing I want more than to walk on out of here.   Patient Stated Goal: Home   Prior Functioning  Home Living Lives With: Alone Available Help at Discharge: Neighbor;Available PRN/intermittently Type of Home: Apartment Home Access: Level entry Home Layout: One level Home Adaptive Equipment: Walker - rolling Prior Function Level of Independence: Independent with assistive device(s) Able to Take Stairs?: Yes Driving: No Vocation: On disability Comments: pt notes sometimes she makes her own meals, but most of the time she walks over to  the fast food places  across the street from her or her neighbor makes meals.   Communication Communication: No difficulties    Cognition  Overall Cognitive Status: Impaired Area of Impairment: Memory;Safety/judgement Arousal/Alertness: Awake/alert Orientation Level: Oriented X4 / Intact Behavior During Session: WFL for tasks performed Memory Deficits: Decreased STM, and trouble recalling name of neighbor that A's her and other Home situation questions.   Safety/Judgement: Impulsive Safety/Judgement - Other Comments: Needs cues to wait for PT to move lines/cords Cognition - Other Comments: ? If this is pt's baseline level of cognition.      Extremity/Trunk Assessment Right Lower Extremity Assessment RLE ROM/Strength/Tone: WFL for tasks assessed RLE Sensation: WFL - Light Touch Left Lower Extremity Assessment LLE ROM/Strength/Tone: WFL for tasks assessed LLE Sensation: WFL - Light Touch Trunk Assessment Trunk Assessment: Normal   Balance Balance Balance Assessed: No  End of Session PT - End of Session Equipment Utilized During Treatment: Oxygen Activity Tolerance: Patient tolerated treatment well Patient left: in chair;with call bell/phone within reach Nurse Communication: Mobility status  GP     Sunny Schlein, [place] [phone] 04/25/2012, 10:15 AM

## 2012-04-25 NOTE — Progress Notes (Signed)
Name: Donna [name] MRN: [id number] DOB: 1955/09/22    LOS: 2  Referring Provider:  Maryruth Bun EDP Reason for Referral:  AE COPD, NSTEMI  PULMONARY / CRITICAL CARE MEDICINE  HPI:  This is a 56 y/o female with COPD and possible CAD (had a normal LHC in 2011) who was recently discharged from an outside hospital for a COPD exacerbation came back to the [facility] ED on 04/22/12 with worsening shortness of breath, confusion and chest pain.  She was found to have a respiratory acidosis and an elevated troponin.  She was placed on BIPAP, given solumedrol and lasix and transferred to [facility] for further evaluation.  Intubated on 8/13 5 AM.  Vital Signs: Temp:  [97.8 F (36.6 C)-98.4 F (36.9 C)] 98.4 F (36.9 C) (08/15 0738) Pulse Rate:  [68-104] 68  (08/15 0900) Resp:  [16-28] 18  (08/15 0900) BP: (109-139)/(38-77) 114/72 mmHg (08/15 0900) SpO2:  [80 %-98 %] 96 % (08/15 0917)  Physical Examination: Gen: Alert and oriented. HEENT: NCAT, PERRL, EOMi, OP clear, neck supple without masses. PULM: Insp crackles L base, wheezing bilaterally. CV: RRR, no mgr, no JVD. AB: BS+, soft, nontender, no hsm. Ext: warm, trace edema, no clubbing, no cyanosis. Derm: no rash or skin breakdown. Neuro: Moving all ext to command.  Principal Problem:  *NSTEMI (non-ST elevated myocardial infarction) Active Problems:  TOBACCO USER  GASTROESOPHAGEAL REFLUX DISEASE  COPD exacerbation  Pulmonary edema  Acute respiratory failure  04/22/12 ED labs: PT INR 1.1 U/A negative WBC 8.8, H/H 14.1/43, PLT 193 BMET Na 139, K 3.9, Cl 99, CO2 32, BUN 18, Cr 0.97, Glu 118 CK 38, CK MB 5.8 Trop 1.35 ABG 7.26 /72/74/94.8 BNP 2100 EtOH < 5  ASSESSMENT AND PLAN  PULMONARY  Lab 04/23/12 0544 04/23/12 0347  PHART 7.349* 7.243*  PCO2ART 59.2* 86.1*  PO2ART 61.0* 125.0*  HCO3 32.8* 37.1*  O2SAT 90.0 98.0   Ventilator Settings:   04/23/12 CXR portable >> emphysema; bibasilar airspace disease improved from  04/22/12 ER  film ETT:  n/a  A:  AE COPD, hypercapnic respiratory failure, Pulmonary edema in setting of NSTEMI; more hypercapnic on arrival here, likely due to high FiO2 P:   -Extubated and doing well. -Change solumedrol to PO prednisone at 40 mg PO daily x4 days then d/c. -Moxi for a total of 5 days. -A/A nebs. -See cardiology section -Hold additional lasix.  CARDIOVASCULAR  Lab 04/23/12 1900 04/23/12 1127 04/23/12 0327 04/18/12 1924  TROPONINI 0.73* 1.10* 0.75* <0.30  LATICACIDVEN -- -- -- --  PROBNP -- -- 19352.0* 16764.0*   ECG:  NSR, no ST wave changes Lines: R IJ TLC 8/13>>>  A: NSTEMI in setting of AE COPD; Acute pulmonary edema P:  -Per cardiology -D/C lasix. -D/C Heparin gtt -2D echo noted.  RENAL  Lab 04/25/12 0630 04/24/12 0607 04/23/12 0427 04/18/12 1924  NA 144 145 143 143  K 3.8 4.0 -- --  CL 100 104 99 107  CO2 34* 32 34* 30  BUN 27* 27* 15 14  CREATININE 0.69 0.78 0.72 0.68  CALCIUM 8.1* 8.0* 8.7 9.1  MG 1.7 1.3* -- --  PHOS 4.0 2.1* -- --   Intake/Output      08/14 0701 - 08/15 0700 08/15 0701 - 08/16 0700   P.O. 400    I.V. (mL/kg) 663 (9.4) 67 (1)   NG/GT 25    IV Piggyback 402    Total Intake(mL/kg) 1490 (21.2) 67 (1)   Urine (mL/kg/hr) 2425 (1.4)  Total Output 2425    Net -935 +67          Intake/Output Summary (Last 24 hours) at 04/25/12 1007 Last data filed at 04/25/12 0900  Gross per 24 hour  Intake   1480 ml  Output   2425 ml  Net   -945 ml   Foley:  8/13  A:  No acute issues P:   -D/C Lasix. -Potassium and mag replacement -Recheck BMET in AM.  GASTROINTESTINAL No results found for this basename: AST:5,ALT:5,ALKPHOS:5,BILITOT:5,PROT:5,ALBUMIN:5 in the last 168 hours  A:  GERD P:   -Pepcid for SUP prophylaxis -Heart healthy diet.  HEMATOLOGIC  Lab 04/25/12 0630 04/24/12 0607 04/23/12 0427 04/18/12 1924  HGB 13.2 13.6 15.1* 14.7  HCT 41.8 42.3 47.2* 45.8  PLT 222 224 226 260  INR -- -- -- --  APTT -- -- -- --   A:  No  acute issues P:  -monitor cbc  INFECTIOUS  Lab 04/25/12 0630 04/24/12 0607 04/23/12 0427 04/18/12 1924  WBC 15.8* 13.6* 6.5 10.4  PROCALCITON -- -- <0.10 --   Cultures:   Antibiotics: -04/23/12 Moxi (AE COPD) >>  A:  AE COPD with tracheobronchitis, no other clear infection P:   -Moxi changed to PO and d/c after 3 more doses. -culture if febrile  ENDOCRINE  Lab 04/25/12 0741 04/24/12 1623 04/24/12 1240 04/24/12 0745 04/24/12 0406  GLUCAP 158* 161* 157* 141* 189*   A:  No acute issues  P:   -monitor glucose on steroids  NEUROLOGIC  A:  Acute encephalopathy due to hypercapnic respiratory failure, possibly related to benzo overdose?  Unclear history.  Need to clarify with family. P:   -Treat Hypercapnea as above -Evidently on ridiculously high doses of benzos at home, will d/c IV form and maintain on PO ativan only with percocet for pain.  Transfer to floor, TRH to pick up on 8/16, PCCM signing off, please call back if needed.  Alyson Reedy, M.D. [facility] Pulmonary/Critical Care Medicine. Pager: [phone]. After hours pager: [phone].

## 2012-04-25 NOTE — Progress Notes (Signed)
Patient ID: Donna [name], female   DOB: Jan 12, 1956, 56 y.o.   MRN: [id number]    Subjective:  Extubated no pain this am.  See nursing notes regarding xanax and anxiolytics  Objective:  Filed Vitals:   04/25/12 0500 04/25/12 0600 04/25/12 0700 04/25/12 0738  BP:   109/66   Pulse: 81 76 76   Temp:    98.4 F (36.9 C)  TempSrc:    Oral  Resp: 22 20 16    Height:      Weight:      SpO2: 97% 98% 97%     Intake/Output from previous day:  Intake/Output Summary (Last 24 hours) at 04/25/12 0806 Last data filed at 04/25/12 0700  Gross per 24 hour  Intake   1426 ml  Output   2425 ml  Net   -999 ml    Physical Exam:  Sleepy post sedation HEENT: normal Neck supple with no adenopathy JVP normal no bruits no thyromegaly Lungs rhonchi  Heart:  S1/S2 no murmur, no rub, gallop or click PMI normal Abdomen: benighn, BS positve, no tenderness, no AAA no bruit.  No HSM or HJR Distal pulses intact with no bruits No edema Neuro non-focal Skin warm and dry    Lab Results: Basic Metabolic Panel:  [facility] 04/24/12 0607 04/23/12 0427  NA 145 143  K 4.0 4.0  CL 104 99  CO2 32 34*  GLUCOSE 161* 141*  BUN 27* 15  CREATININE 0.78 0.72  CALCIUM 8.0* 8.7  MG 1.3* --  PHOS 2.1* --   CBC:  Basename 04/25/12 0630 04/24/12 0607  WBC 15.8* 13.6*  NEUTROABS -- --  HGB 13.2 13.6  HCT 41.8 42.3  MCV 99.8 100.2*  PLT 222 224   Cardiac Enzymes:  Basename 04/23/12 1900 04/23/12 1127 04/23/12 0327  CKTOTAL 29 31 43  CKMB 4.2* 5.1* 6.0*  CKMBINDEX -- -- --  TROPONINI 0.73* 1.10* 0.75*   Imaging: Dg Chest Port 1 View  04/24/2012  *RADIOLOGY REPORT*  Clinical Data: Respiratory failure.  PORTABLE CHEST - 1 VIEW  Comparison: 04/23/2012  Findings: Endotracheal tube tip approximately 2 cm above the carina. Stable chronic lung disease and bilateral lower lobe atelectasis.  No overt pulmonary edema.  No significant pleural fluid.  Heart size is stable.  IMPRESSION: Stable chronic  lung disease and bilateral lower lobe atelectasis.  Original Report Authenticated By: Reola Calkins, M.D.    Cardiac Studies:  ECG: Normal This am  Twave changes 8/14 consistant with right heart strain   Telemetry: NSR no arrythmia 04/25/2012   Echo: 7/29 [place] EF 60-65%  Mild to moderate LVH 8/13 ? Of mid cavitary obstruction normal EF no MR  Medications:      . ipratropium  0.5 mg Nebulization Q4H   And  . albuterol  2.5 mg Nebulization Q4H  . antiseptic oral rinse  15 mL Mouth Rinse BID  . famotidine  20 mg Oral BID  . furosemide      . furosemide  20 mg Intravenous Q6H  . magnesium sulfate 1 - 4 g bolus IVPB  2 g Intravenous Once  . methylPREDNISolone (SOLU-MEDROL) injection  40 mg Intravenous Q12H  . moxifloxacin  400 mg Intravenous Q24H  . multivitamin with minerals  1 tablet Per Tube Daily  . nitroGLYCERIN      . PARoxetine  20 mg Oral Daily  . phosphorus  500 mg Oral TID  . potassium chloride  40 mEq Oral Once  . sodium  chloride  3 mL Intravenous Q12H  . DISCONTD: albuterol-ipratropium  6 puff Inhalation Q4H  . DISCONTD: antiseptic oral rinse  15 mL Mouth Rinse QID  . DISCONTD: chlorhexidine  15 mL Mouth Rinse BID  . DISCONTD: famotidine (PEPCID) IV  20 mg Intravenous Q12H  . DISCONTD: feeding supplement (OXEPA)  1,000 mL Per Tube Q24H  . DISCONTD: feeding supplement  30 mL Per Tube 5 X Daily  . DISCONTD: insulin glargine  5 Units Subcutaneous BID  . DISCONTD: magnesium  2 g Intravenous Once  . DISCONTD: methylPREDNISolone (SOLU-MEDROL) injection  80 mg Intravenous Q12H  . DISCONTD: potassium chloride  40 mEq Per Tube TID        . heparin 1,350 Units/hr (04/24/12 1700)  . DISCONTD: fentaNYL infusion INTRAVENOUS 50 mcg/hr (04/24/12 0529)  . DISCONTD: heparin 1,300 Units/hr (04/24/12 1648)  . DISCONTD: midazolam (VERSED) infusion 3 mg/hr (04/24/12 0022)    Assessment/Plan:  Card:  No elevation in CPK and minimal elevation in troponin.  ECG normal this  am and only showing signs of right heart strain I reviewed her echo here read by [facility] and disagree.  There is no apical hypokinesis.  The RV is moderately dilated and hypokinetic And the mid cavitary signal is mostly MR.  She has no murmur on exam.  Clean cath in 2011. No further cardiac w/u indicated Ok to transfer to  Floor.  On ativan and xanax now with apparantly riduculously high doses being taken at home.  Will sign off  Charlton Haws 04/25/2012, 8:06 AM

## 2012-04-26 LAB — CBC
MCH: 31.7 pg (ref 26.0–34.0)
MCV: 102.6 fL — ABNORMAL HIGH (ref 78.0–100.0)
Platelets: 196 10*3/uL (ref 150–400)
RBC: 4.26 MIL/uL (ref 3.87–5.11)
RDW: 15.2 % (ref 11.5–15.5)

## 2012-04-26 LAB — MAGNESIUM: Magnesium: 1.9 mg/dL (ref 1.5–2.5)

## 2012-04-26 LAB — BASIC METABOLIC PANEL
CO2: 33 mEq/L — ABNORMAL HIGH (ref 19–32)
Calcium: 8.7 mg/dL (ref 8.4–10.5)
Creatinine, Ser: 0.6 mg/dL (ref 0.50–1.10)

## 2012-04-26 LAB — GLUCOSE, CAPILLARY: Glucose-Capillary: 102 mg/dL — ABNORMAL HIGH (ref 70–99)

## 2012-04-26 MED ORDER — ALPRAZOLAM 1 MG PO TABS: 1.0000 mg | ORAL_TABLET | Freq: Every evening | ORAL | Status: DC | PRN

## 2012-04-26 MED ORDER — PREDNISONE 20 MG PO TABS: 40.0000 mg | ORAL_TABLET | Freq: Every day | ORAL | Status: AC

## 2012-04-26 MED ORDER — TIOTROPIUM BROMIDE MONOHYDRATE 18 MCG IN CAPS: 18.0000 ug | ORAL_CAPSULE | Freq: Every day | RESPIRATORY_TRACT | Status: AC

## 2012-04-26 MED ORDER — HYDROCODONE-ACETAMINOPHEN 10-325 MG PO TABS: 1.0000 | ORAL_TABLET | Freq: Four times a day (QID) | ORAL | Status: DC | PRN

## 2012-04-26 MED ORDER — MOXIFLOXACIN HCL 400 MG PO TABS: 400.0000 mg | ORAL_TABLET | Freq: Every day | ORAL | Status: AC

## 2012-04-26 MED ORDER — BUDESONIDE-FORMOTEROL FUMARATE 160-4.5 MCG/ACT IN AERO: 2.0000 | INHALATION_SPRAY | Freq: Two times a day (BID) | RESPIRATORY_TRACT | Status: AC

## 2012-05-08 NOTE — Discharge Summary (Addendum)
Physician Discharge Summary  Patient ID: BREYON BLASS MRN: [id number] DOB/AGE: Mar 12, 1956 56 y.o.  Admit date: 04/23/2012 Discharge date: 05/08/2012  Primary Care Physician:  Louie Boston, MD   Discharge Diagnoses:    Principal Problem:  Acute respiratory failure  COPD exacerbation Elevated Troponin/Right heart strain Active Problems:  TOBACCO USER  GASTROESOPHAGEAL REFLUX DISEASE  COPD exacerbation  Pulmonary edema  Anxiety    Medication List  As of 05/08/2012  3:36 PM   STOP taking these medications         predniSONE 10 MG tablet         TAKE these medications         acetaminophen 500 MG tablet   Commonly known as: TYLENOL   Take 500 mg by mouth every 6 (six) hours as needed.      ALPRAZolam 1 MG tablet   Commonly known as: XANAX   Take 1 tablet (1 mg total) by mouth at bedtime as needed.      budesonide-formoterol 160-4.5 MCG/ACT inhaler   Commonly known as: SYMBICORT   Inhale 2 puffs into the lungs 2 (two) times daily.      cetirizine 10 MG tablet   Commonly known as: ZYRTEC   Take 10 mg by mouth daily.      HYDROcodone-acetaminophen 10-325 MG per tablet   Commonly known as: NORCO   Take 1 tablet by mouth every 6 (six) hours as needed.      metoprolol tartrate 25 MG tablet   Commonly known as: LOPRESSOR   Take 12.5 mg by mouth 2 (two) times daily.      pantoprazole 40 MG tablet   Commonly known as: PROTONIX   Take 40 mg by mouth daily.      PARoxetine 20 MG tablet   Commonly known as: PAXIL   Take 20 mg by mouth daily.      PROAIR HFA 108 (90 BASE) MCG/ACT inhaler   Generic drug: albuterol   Inhale 2 puffs into the lungs every 6 (six) hours as needed.      tiotropium 18 MCG inhalation capsule   Commonly known as: SPIRIVA   Place 1 capsule (18 mcg total) into inhaler and inhale daily.           Disposition and Follow-up:  PCP in 1 week  Consults:   Cardiology, Dr.Nishan PCCM   Significant Diagnostic Studies:   CXR IMPRESSION: Stable chronic lung disease and bilateral lower lobe atelectasis.   PORTABLE CHEST - 1 VIEW IMPRESSION: 1. Endotracheal tube 2 cm from the carina. 2. Proximal position of nasogastric tube which should be advanced 10 cm. 3. Worsening aeration with increasing basilar atelectasis and probable basilar airspace disease/edema.  PORTABLE CHEST - 1 VIEW IMPRESSION: Constellation of findings compatible with mild CHF. Subsegmental atelectasis at the left lung base.  ECHO 8/13 Study Conclusions Left ventricle: The cavity size was normal. There was mild concentric hypertrophy. Systolic function was vigorous. The estimated ejection fraction was in the range of 65% to 70%. Wall motion was normal; there were no regional wall motion abnormalities.   Brief H and P: This is a 56 y/o female with COPD and possible CAD (had a normal LHC in 2011) who was recently discharged from an outside hospital for a COPD exacerbation came back to the [facility] ED on 04/22/12 with worsening shortness of breath, confusion and chest pain. She was found to have a respiratory acidosis and an elevated troponin. She was placed on BIPAP, given solumedrol and  lasix and transferred to [facility] for further evaluation.     Hospital Course:   A: AE COPD, hypercapnic respiratory failure, Pulmonary edema ; more hypercapnic on arrival here, likely due to high FiO2  P:  -was intubated due to respiratory failure Subsequently treated with nebs, steroids, diuretics and subsequently Extubated and doing well.  -Change solumedrol to PO prednisone at 40 mg PO daily x4 days then d/c.  -Moxi for a total of 5 days.  -A/A nebs.  Was transitioned to [facility] the day of discharge  A: Acute pulmonary edema in setting of Acute exacerbation of COPD; P:  -Per cardiology  Noted to have elevated troponin but CPK and MB negative, suspect to be from right heart strain and not cardiac event Seen by Dr.Nishan and this was  his impression Card: No elevation in CPK and minimal elevation in troponin. ECG normal this am and only showing signs of right heart strain  Dr.Nishan  reviewed her echo here read by [facility] and felt that there is no apical hypokinesis. The RV is moderately dilated andhypokinetic  And the mid cavitary signal is mostly MR. . Clean cath in 2011. No further cardiac w/u indicated    A: Acute encephalopathy due to hypercapnic respiratory failure, possibly related to benzo overdose? Unclear history. P:  -Treat Hypercapnea as above  -Evidently on ridiculously high doses of benzos at home, will d/c IV form and maintain on PO ativan only with percocet for pain.   Time spent on Discharge:  Signed: Fareeha Evon [facility]  05/08/2012, 3:36 PM

## 2012-05-09 ENCOUNTER — Ambulatory Visit (INDEPENDENT_AMBULATORY_CARE_PROVIDER_SITE_OTHER): Payer: Medicare Other | Admitting: Physician Assistant

## 2012-05-09 ENCOUNTER — Encounter: Payer: Self-pay | Admitting: Physician Assistant

## 2012-05-09 VITALS — BP 146/93 | HR 72 | Ht 63.0 in | Wt 152.4 lb

## 2012-05-09 DIAGNOSIS — I214 Non-ST elevation (NSTEMI) myocardial infarction: Secondary | ICD-10-CM

## 2012-05-09 MED ORDER — ASPIRIN EC 81 MG PO TBEC: 81.0000 mg | DELAYED_RELEASE_TABLET | Freq: Every day | ORAL | Status: DC

## 2012-05-09 NOTE — Assessment & Plan Note (Signed)
No further cardiac workup is indicated. Patient had 2 recent successive hospitalizations, initially here at [facility] and then at [facility], during which she was diagnosed with NST EMI (type II). The first occurred in the setting of profound hypotension with associated acute RI, secondary to refractory diarrhea. The second occurred in the setting of COPD exacerbation/hypercapnic respiratory failure. In both instances, she had a 2-D echocardiogram which yielded normal LVF and no focal WMAs. Given her normal cardiac catheterization in 2011, our team at [facility] felt that no further workup was indicated. Therefore, I recommended today down titration of aspirin to 81 daily, and tapering off of Lopressor altogether. She presents with no known history of HTN, and I would not recommend treatment with a beta blocker if she were to develop HTN, given her history of severe COPD. We will have her follow with Dr. Simona Huh on an as-needed basis.

## 2012-05-09 NOTE — Progress Notes (Signed)
Primary Cardiologist: Simona Huh, MD   HPI: Post hospital followup from [facility], following direct transfer from [facility] ED on August 13, for evaluation of NST EMI. Of note, patient presented in the setting of respiratory acidosis. She was seen in consultation by our team at [facility] and a 2-D echo was done. This yielded EF 65-70%, with normal wall motion. She had minimal elevation of troponin and, citing a normal cardiac catheterization in 2001, no further cardiac workup was recommended. Of note, she had just been previously seen here at [facility] on July 29, again for evaluation of NST EMI (type II), but in the setting of profound hypotension (systolic 60s) and acute renal failure (creatinine 1.7), secondary to refractory diarrhea. We placed her on low-dose aspirin and also obtained an echocardiogram (EF 60-65%), no focal WMAs, and evidence of diastolic dysfunction. No further cardiac workup was recommended, as well.  Since her most recent hospitalization, the patient is trying very hard to stop smoking altogether, with the aid of electronic cigarettes. She is currently down to only a few cigarettes a day. She has since been seen in followup by her primary care team, at Dr. Jackolyn Confer office. She apparently has been scheduled to be evaluated by our Pulmonary service in [place]. Of note, she currently remains on continuous supplemental oxygen.  Allergies  Allergen Reactions  . Corn-Containing Products Shortness Of Breath and Swelling    REACTION: affects breathing  . Eggs Or Egg-Derived Products Shortness Of Breath  . Latex Shortness Of Breath, Itching and Rash  . Codeine Itching and Nausea And Vomiting  . Penicillins Other (See Comments)    REACTION: "thrush"  . Sulfa Antibiotics Other (See Comments)    REACTION: Unknown    Current Outpatient Prescriptions  Medication Sig Dispense Refill  . acetaminophen (TYLENOL) 500 MG tablet Take 500 mg by mouth every 6 (six) hours as needed.      Marland Kitchen albuterol (PROAIR  HFA) 108 (90 BASE) MCG/ACT inhaler Inhale 2 puffs into the lungs every 6 (six) hours as needed.      . ALPRAZolam (XANAX) 1 MG tablet Take 1 tablet (1 mg total) by mouth at bedtime as needed.  20 tablet  0  . budesonide-formoterol (SYMBICORT) 160-4.5 MCG/ACT inhaler Inhale 2 puffs into the lungs 2 (two) times daily.  1 Inhaler  12  . cetirizine (ZYRTEC) 10 MG tablet Take 10 mg by mouth daily.      Marland Kitchen ipratropium-albuterol (DUONEB) 0.5-2.5 (3) MG/3ML SOLN Take 3 mLs by nebulization 2 (two) times daily.      Marland Kitchen nystatin (MYCOSTATIN) 100000 UNIT/ML suspension Take 5 mLs by mouth 4 (four) times daily.      . pantoprazole (PROTONIX) 40 MG tablet Take 40 mg by mouth daily.      Marland Kitchen PARoxetine (PAXIL) 20 MG tablet Take 20 mg by mouth daily.      Marland Kitchen tiotropium (SPIRIVA HANDIHALER) 18 MCG inhalation capsule Place 1 capsule (18 mcg total) into inhaler and inhale daily.  30 capsule  12  . aspirin EC 81 MG tablet Take 1 tablet (81 mg total) by mouth daily.        Past Medical History  Diagnosis Date  . Coronary artery disease   . Degenerative disc disease   . Asthma   . Lupus   . Hepatitis C     Past Surgical History  Procedure Date  . Cholecystectomy   . Appendectomy   . Tonsillectomy   . Tubal ligation   . Polyps on vocal  cords     History   Social History  . Marital Status: Widowed    Spouse Name: N/A    Number of Children: N/A  . Years of Education: N/A   Occupational History  . Not on file.   Social History Main Topics  . Smoking status: Current Everyday Smoker  . Smokeless tobacco: Not on file   Comment: using e-cig also, cutting back  . Alcohol Use: Yes     occ  . Drug Use: No  . Sexually Active: Not on file   Other Topics Concern  . Not on file   Social History Narrative  . No narrative on file    No family history on file.  ROS: no nausea, vomiting; no fever, chills; no melena, hematochezia; no claudication  PHYSICAL EXAM: BP 146/93  Pulse 72  Ht 5\' 3"  (1.6 m)   Wt 152 lb 6.4 oz (69.128 kg)  BMI 27.00 kg/m2 GENERAL: 56 year old female; NAD HEENT: NCAT, PERRLA, EOMI; sclera clear; no xanthelasma; nasal cannula NECK: palpable bilateral carotid pulses, no bruits; no JVD; no TM LUNGS: Coarse expiratory wheezes, rhonchi CARDIAC: RRR (S1, S2); no significant murmurs; no rubs or gallops ABDOMEN: soft, non-tender; intact BS EXTREMETIES: no significant peripheral edema SKIN: warm/dry; no obvious rash/lesions MUSCULOSKELETAL: no joint deformity NEURO: no focal deficit; NL affect   EKG:    ASSESSMENT & PLAN:  NSTEMI (non-ST elevated myocardial infarction) No further cardiac workup is indicated. Patient had 2 recent successive hospitalizations, initially here at [facility] and then at [facility], during which she was diagnosed with NST EMI (type II). The first occurred in the setting of profound hypotension with associated acute RI, secondary to refractory diarrhea. The second occurred in the setting of COPD exacerbation/hypercapnic respiratory failure. In both instances, she had a 2-D echocardiogram which yielded normal LVF and no focal WMAs. Given her normal cardiac catheterization in 2011, our team at [facility] felt that no further workup was indicated. Therefore, I recommended today down titration of aspirin to 81 daily, and tapering off of Lopressor altogether. She presents with no known history of HTN, and I would not recommend treatment with a beta blocker if she were to develop HTN, given her history of severe COPD. We will have her follow with Dr. Simona Huh on an as-needed basis.    Gene Lela Gell, PAC

## 2012-05-09 NOTE — Patient Instructions (Signed)
   Decrease Aspirin to 81mg  daily  Decrease Lopressor to 12.5mg  daily x 7 days, then STOP Follow up as needed

## 2012-05-14 ENCOUNTER — Ambulatory Visit (INDEPENDENT_AMBULATORY_CARE_PROVIDER_SITE_OTHER): Payer: Medicare Other | Admitting: Adult Health

## 2012-05-14 ENCOUNTER — Encounter: Payer: Self-pay | Admitting: Adult Health

## 2012-05-14 VITALS — BP 118/78 | HR 81 | Temp 96.9°F | Ht 60.75 in | Wt 151.6 lb

## 2012-05-14 DIAGNOSIS — J441 Chronic obstructive pulmonary disease with (acute) exacerbation: Secondary | ICD-10-CM

## 2012-05-14 NOTE — Patient Instructions (Addendum)
Work on stopping smoking  ABSOLUTELY NO SMOKING AROUND OXYGEN-CAN NOT SMOKE INSIDE YOUR APARTMENT EVEN IF OXYGEN IS OFF Continue on Symbicort and Spriiva  Follow up in 3 weeks with Dr. Craige Cotta  With PFT and As needed

## 2012-05-14 NOTE — Progress Notes (Signed)
Reviewed and agree with assessment/plan. 

## 2012-05-14 NOTE — Assessment & Plan Note (Signed)
Recent exacerbation with hypercarbic resp failure -AECOPD complicated by possible NSTEMI and pulmonary edema.  Advised on smoking cessation  Cont on o2 along with spiriva and symbicort   Plan  Work on stopping smoking  ABSOLUTELY NO SMOKING AROUND OXYGEN-CAN NOT SMOKE INSIDE YOUR APARTMENT EVEN IF OXYGEN IS OFF Continue on Symbicort and Spriiva  Follow up in 3 weeks with Dr. Craige Cotta  With PFT and As needed

## 2012-05-14 NOTE — Progress Notes (Signed)
Subjective:    Patient ID: Donna [name], female    DOB: Oct 06, 1955, 56 y.o.   MRN: [id number]  HPI 57 year old female with COPD, admitted April 22, 2012 at [facility] ER  with acute respiratory failure, confusion. She was transferred to [facility]  found to have hypercarbic respiratory failure requiring vent support , pulmonary edema, and NSTEMI to PCCM service .   05/14/2012 Post Hospital follow up  Patient was admitted August 13 2 August 28 4 non-ST elevated MI, complicated by hypercarbic respiratory failure, acute COPD, exacerbation, and pulmonary edema. She required mechanical ventilatory support along with aggressive pulmonary hygiene with nebs, and steroids, along with diuresis. Patient was seen by cardiology. Patient had a, previously normal cardiac catheterization in 2011. There was only minimal elevation in her troponin. Echo showed no apical hypokinesis. The right ventricle was mildly, moderately dilated and hypokinetic. Patient did experience some acute encephalopathy, felt secondary to her hypercapnia and also, a previous use of high-dose benzodiazepines prior to admission Discharged on o2 .   Since discharge she is feeling much better.  Wearing o2.  We discussed dangers of smoking and O2.  No hemoptysis. No edema. No chest pain    HAs [employer]/M[employer] insurance that covers her meds.  Lives alone. Does not drive due to seizures Friends help her to pharmacy.  Says she takes her meds regularly without missed dose  She has had multiple hospital visits past . We discussed med compliance and  Smoking cessatation    SH:  Disabled .  Worked at previously at [employer]  Smokes 2.5 PPD at her highest. Currently 1/2 PPD  Smoked since age 73  Hx of etoh abuse  No drugs.      Review of Systems Constitutional:   No  weight loss, night sweats,  Fevers, chills,  +fatigue, or  lassitude.  HEENT:   No headaches,  Difficulty swallowing,  Tooth/dental problems, or  Sore  throat,                No sneezing, itching, ear ache, nasal congestion, post nasal drip,   CV:  No chest pain,  Orthopnea, PND, swelling in lower extremities, anasarca, dizziness, palpitations, syncope.   GI  No heartburn, indigestion, abdominal pain, nausea, vomiting, diarrhea, change in bowel habits, loss of appetite, bloody stools.   Resp:    No coughing up of blood.   No chest wall deformity  Skin: no rash or lesions.  GU: no dysuria, change in color of urine, no urgency or frequency.  No flank pain, no hematuria   MS:  No joint pain or swelling.  No decreased range of motion.  No back pain.  Psych:   No memory loss.          Objective:   Physical Exam GEN: A/Ox3; pleasant , NAD, obese , on O2   HEENT:  [place]/AT,  EACs-clear, TMs-wnl, NOSE-clear, THROAT-clear, no lesions, no postnasal drip or exudate noted. Very poor dentition with several missing teeth   NECK:  Supple w/ fair ROM; no JVD; normal carotid impulses w/o bruits; no thyromegaly or nodules palpated; no lymphadenopathy.  RESP  Clear  P & A; w/o, wheezes/ rales/ or rhonchi.no accessory muscle use, no dullness to percussion  CARD:  RRR, no m/r/g  , no peripheral edema, pulses intact, no cyanosis or clubbing.  GI:   Soft & nt; nml bowel sounds; no organomegaly or masses detected.  Musco: Warm bil, no deformities or joint swelling noted.  Neuro: alert, no focal deficits noted.    Skin: Warm, no lesions or rashes         Assessment & Plan:

## 2012-05-26 ENCOUNTER — Emergency Department (HOSPITAL_COMMUNITY): Payer: Medicare Other

## 2012-05-26 ENCOUNTER — Encounter (HOSPITAL_COMMUNITY): Payer: Self-pay | Admitting: *Deleted

## 2012-05-26 ENCOUNTER — Inpatient Hospital Stay (HOSPITAL_COMMUNITY)
Admission: EM | Admit: 2012-05-26 | Discharge: 2012-06-04 | DRG: 882 | Disposition: A | Discharge status: Home / Self Care | Payer: Medicare Other | Attending: Internal Medicine | Admitting: Internal Medicine

## 2012-05-26 DIAGNOSIS — F1320 Sedative, hypnotic or anxiolytic dependence, uncomplicated: Secondary | ICD-10-CM | POA: Diagnosis present

## 2012-05-26 DIAGNOSIS — F10239 Alcohol dependence with withdrawal, unspecified: Secondary | ICD-10-CM | POA: Diagnosis present

## 2012-05-26 DIAGNOSIS — Z79899 Other long term (current) drug therapy: Secondary | ICD-10-CM

## 2012-05-26 DIAGNOSIS — F329 Major depressive disorder, single episode, unspecified: Secondary | ICD-10-CM

## 2012-05-26 DIAGNOSIS — E512 Wernicke's encephalopathy: Secondary | ICD-10-CM

## 2012-05-26 DIAGNOSIS — I10 Essential (primary) hypertension: Secondary | ICD-10-CM

## 2012-05-26 DIAGNOSIS — F10939 Alcohol use, unspecified with withdrawal, unspecified: Secondary | ICD-10-CM | POA: Diagnosis present

## 2012-05-26 DIAGNOSIS — J449 Chronic obstructive pulmonary disease, unspecified: Secondary | ICD-10-CM

## 2012-05-26 DIAGNOSIS — R112 Nausea with vomiting, unspecified: Secondary | ICD-10-CM | POA: Diagnosis present

## 2012-05-26 DIAGNOSIS — IMO0002 Reserved for concepts with insufficient information to code with codable children: Secondary | ICD-10-CM | POA: Diagnosis present

## 2012-05-26 DIAGNOSIS — F5089 Other specified eating disorder: Principal | ICD-10-CM | POA: Diagnosis present

## 2012-05-26 DIAGNOSIS — B1710 Acute hepatitis C without hepatic coma: Secondary | ICD-10-CM | POA: Diagnosis present

## 2012-05-26 DIAGNOSIS — R569 Unspecified convulsions: Secondary | ICD-10-CM

## 2012-05-26 DIAGNOSIS — B182 Chronic viral hepatitis C: Secondary | ICD-10-CM | POA: Diagnosis present

## 2012-05-26 DIAGNOSIS — Z9981 Dependence on supplemental oxygen: Secondary | ICD-10-CM

## 2012-05-26 DIAGNOSIS — F1910 Other psychoactive substance abuse, uncomplicated: Secondary | ICD-10-CM

## 2012-05-26 DIAGNOSIS — J811 Chronic pulmonary edema: Secondary | ICD-10-CM

## 2012-05-26 DIAGNOSIS — Z91040 Latex allergy status: Secondary | ICD-10-CM

## 2012-05-26 DIAGNOSIS — J9600 Acute respiratory failure, unspecified whether with hypoxia or hypercapnia: Secondary | ICD-10-CM

## 2012-05-26 DIAGNOSIS — Z7982 Long term (current) use of aspirin: Secondary | ICD-10-CM

## 2012-05-26 DIAGNOSIS — F411 Generalized anxiety disorder: Secondary | ICD-10-CM | POA: Diagnosis present

## 2012-05-26 DIAGNOSIS — F17200 Nicotine dependence, unspecified, uncomplicated: Secondary | ICD-10-CM

## 2012-05-26 DIAGNOSIS — G40401 Other generalized epilepsy and epileptic syndromes, not intractable, with status epilepticus: Secondary | ICD-10-CM | POA: Diagnosis present

## 2012-05-26 DIAGNOSIS — F3289 Other specified depressive episodes: Secondary | ICD-10-CM

## 2012-05-26 DIAGNOSIS — Z91199 Patient's noncompliance with other medical treatment and regimen due to unspecified reason: Secondary | ICD-10-CM

## 2012-05-26 DIAGNOSIS — K219 Gastro-esophageal reflux disease without esophagitis: Secondary | ICD-10-CM

## 2012-05-26 DIAGNOSIS — Z91018 Allergy to other foods: Secondary | ICD-10-CM

## 2012-05-26 DIAGNOSIS — R0789 Other chest pain: Secondary | ICD-10-CM

## 2012-05-26 DIAGNOSIS — Z9119 Patient's noncompliance with other medical treatment and regimen: Secondary | ICD-10-CM

## 2012-05-26 DIAGNOSIS — G4700 Insomnia, unspecified: Secondary | ICD-10-CM

## 2012-05-26 DIAGNOSIS — E876 Hypokalemia: Secondary | ICD-10-CM

## 2012-05-26 DIAGNOSIS — F1020 Alcohol dependence, uncomplicated: Secondary | ICD-10-CM | POA: Diagnosis present

## 2012-05-26 DIAGNOSIS — Z91012 Allergy to eggs: Secondary | ICD-10-CM

## 2012-05-26 DIAGNOSIS — Z882 Allergy status to sulfonamides status: Secondary | ICD-10-CM

## 2012-05-26 DIAGNOSIS — J4489 Other specified chronic obstructive pulmonary disease: Secondary | ICD-10-CM | POA: Diagnosis present

## 2012-05-26 DIAGNOSIS — I252 Old myocardial infarction: Secondary | ICD-10-CM

## 2012-05-26 DIAGNOSIS — G609 Hereditary and idiopathic neuropathy, unspecified: Secondary | ICD-10-CM | POA: Diagnosis present

## 2012-05-26 DIAGNOSIS — Z880 Allergy status to penicillin: Secondary | ICD-10-CM

## 2012-05-26 DIAGNOSIS — M329 Systemic lupus erythematosus, unspecified: Secondary | ICD-10-CM | POA: Diagnosis present

## 2012-05-26 DIAGNOSIS — R079 Chest pain, unspecified: Secondary | ICD-10-CM

## 2012-05-26 DIAGNOSIS — K449 Diaphragmatic hernia without obstruction or gangrene: Secondary | ICD-10-CM | POA: Diagnosis present

## 2012-05-26 DIAGNOSIS — J301 Allergic rhinitis due to pollen: Secondary | ICD-10-CM

## 2012-05-26 DIAGNOSIS — Z885 Allergy status to narcotic agent status: Secondary | ICD-10-CM

## 2012-05-26 DIAGNOSIS — J441 Chronic obstructive pulmonary disease with (acute) exacerbation: Secondary | ICD-10-CM

## 2012-05-26 DIAGNOSIS — R197 Diarrhea, unspecified: Secondary | ICD-10-CM

## 2012-05-26 DIAGNOSIS — E519 Thiamine deficiency, unspecified: Secondary | ICD-10-CM | POA: Diagnosis present

## 2012-05-26 DIAGNOSIS — I214 Non-ST elevation (NSTEMI) myocardial infarction: Secondary | ICD-10-CM

## 2012-05-26 DIAGNOSIS — I2510 Atherosclerotic heart disease of native coronary artery without angina pectoris: Secondary | ICD-10-CM | POA: Diagnosis present

## 2012-05-26 LAB — CBC WITH DIFFERENTIAL/PLATELET
Basophils Absolute: 0 10*3/uL (ref 0.0–0.1)
Basophils Relative: 1 % (ref 0–1)
Eosinophils Absolute: 0 10*3/uL (ref 0.0–0.7)
MCH: 31.7 pg (ref 26.0–34.0)
MCHC: 32.8 g/dL (ref 30.0–36.0)
Monocytes Relative: 4 % (ref 3–12)
Neutro Abs: 4.2 10*3/uL (ref 1.7–7.7)
Neutrophils Relative %: 73 % (ref 43–77)
Platelets: 173 10*3/uL (ref 150–400)
RDW: 13.3 % (ref 11.5–15.5)

## 2012-05-26 LAB — COMPREHENSIVE METABOLIC PANEL
AST: 32 U/L (ref 0–37)
Albumin: 4.4 g/dL (ref 3.5–5.2)
Alkaline Phosphatase: 132 U/L — ABNORMAL HIGH (ref 39–117)
BUN: 6 mg/dL (ref 6–23)
Chloride: 101 mEq/L (ref 96–112)
Potassium: 3.4 mEq/L — ABNORMAL LOW (ref 3.5–5.1)
Sodium: 141 mEq/L (ref 135–145)
Total Protein: 6.9 g/dL (ref 6.0–8.3)

## 2012-05-26 LAB — TROPONIN I
Troponin I: <0.3 ng/mL (ref <0.30)
Troponin I: <0.3 ng/mL (ref <0.30)

## 2012-05-26 MED ORDER — MORPHINE SULFATE 4 MG/ML IJ SOLN
4.0000 mg | Freq: Once | INTRAMUSCULAR | Status: AC
  Administered 2012-05-26: 4 mg via INTRAVENOUS
  Filled 2012-05-26: qty 1

## 2012-05-26 MED ORDER — ACETAMINOPHEN 325 MG PO TABS
650.0000 mg | ORAL_TABLET | Freq: Once | ORAL | Status: AC
  Administered 2012-05-26: 650 mg via ORAL

## 2012-05-26 MED ORDER — ONDANSETRON HCL 4 MG/5ML PO SOLN
ORAL | Status: AC
  Filled 2012-05-26: qty 1

## 2012-05-26 MED ORDER — POTASSIUM CHLORIDE CRYS ER 20 MEQ PO TBCR
40.0000 meq | EXTENDED_RELEASE_TABLET | Freq: Once | ORAL | Status: AC
  Administered 2012-05-27: 40 meq via ORAL
  Filled 2012-05-26: qty 2

## 2012-05-26 MED ORDER — ONDANSETRON HCL 4 MG/2ML IJ SOLN
4.0000 mg | Freq: Once | INTRAMUSCULAR | Status: AC
  Administered 2012-05-26: 4 mg via INTRAVENOUS

## 2012-05-26 MED ORDER — KETOROLAC TROMETHAMINE 30 MG/ML IJ SOLN
30.0000 mg | Freq: Once | INTRAMUSCULAR | Status: AC
  Administered 2012-05-27: 30 mg via INTRAVENOUS
  Filled 2012-05-26: qty 1

## 2012-05-26 MED ORDER — HYDROCODONE-ACETAMINOPHEN 5-325 MG PO TABS
2.0000 | ORAL_TABLET | Freq: Once | ORAL | Status: AC
  Administered 2012-05-27: 2 via ORAL
  Filled 2012-05-26: qty 2

## 2012-05-26 MED ORDER — ONDANSETRON HCL 4 MG/2ML IJ SOLN
4.0000 mg | Freq: Once | INTRAMUSCULAR | Status: AC
  Administered 2012-05-26: 4 mg via INTRAVENOUS
  Filled 2012-05-26: qty 2

## 2012-05-26 MED ORDER — ACETAMINOPHEN 325 MG PO TABS
ORAL_TABLET | ORAL | Status: AC
  Filled 2012-05-26: qty 2

## 2012-05-26 NOTE — ED Notes (Signed)
MD made aware of patient's con't. Elevated BP and c/o headache.  Order given for Tylenol PO

## 2012-05-26 NOTE — ED Provider Notes (Cosign Needed)
History  This chart was scribed for Ward Givens, MD by Erskine Emery. This patient was seen in room APA10/APA10 and the patient's care was started at 20:30.   CSN: [id number]  Arrival date & time 05/26/12  2016   First MD Initiated Contact with Patient 05/26/12 2030      Chief Complaint  Patient presents with  . Chest Pain    (Consider location/radiation/quality/duration/timing/severity/associated sxs/prior treatment) The history is provided by the patient. No language interpreter was used.  Donna [name] is a 56 y.o. female who presents to the Emergency Department complaining of intermittent sharp but pressured chest pain in episodes of a few minutes since 5:30pm this evening, after getting out of church and watching a movie. Pt also reports epigastric abdominal pain, back pain (chronic), nausea, emesis (x4, no blood), diarrhea (x2, no blood), and SOB. Pt has a h/o COPD, emphysema, and 3 heart attacks (in the past 4-5 weeks). She has not had stents placed but has had a cardiac catheterization in the past that showed up fine. Pt was told her heart attacks were mild and attributed to her low blood pressure. Pt claims this chest pain feels similar to the pain she had with her last heart attack episode. Pt ran out of pain medication 2 days ago and has yet to call her physician to have them refilled. About 3-4 weeks ago the pt was put on oxygen at home, 24/7. Pt has an appointment to see a pulmonologist again on the 27th and was seen by him this month.  Pt goes to the [facility] in [place] for cardiology.  Dr. Margo Common in [place] is the pt's PCP. Dr Craige Cotta is her pulmonologist  Past Medical History  Diagnosis Date  . Coronary artery disease   . Degenerative disc disease   . Asthma   . Lupus   . Hepatitis C   . Heart attack     Past Surgical History  Procedure Date  . Cholecystectomy   . Appendectomy   . Tonsillectomy   . Tubal ligation   . Polyps on vocal cords     History reviewed.  No pertinent family history.  History  Substance Use Topics  . Smoking status: Current Every Day Smoker -- 0.5 packs/day for 44 years    Types: Cigarettes  . Smokeless tobacco: Not on file   Comment: using e-cig also, cutting back  . Alcohol Use: Yes     occ   Pt is a smoker; she smoked 2 cigaretets today and one yesterday and claims she is trying to quit. Has oxygen at home  OB History    Grav Para Term Preterm Abortions TAB SAB Ect Mult Living                  Review of Systems  Constitutional: Negative for fatigue.  HENT: Negative for congestion, sinus pressure and ear discharge.   Eyes: Negative for discharge.  Respiratory: Positive for shortness of breath. Negative for cough.   Cardiovascular: Positive for chest pain.  Gastrointestinal: Positive for nausea, vomiting, abdominal pain and diarrhea.  Genitourinary: Negative for frequency and hematuria.  Musculoskeletal: Positive for back pain.  Skin: Negative for rash.  Neurological: Negative for seizures and headaches.  Hematological: Negative.   Psychiatric/Behavioral: Negative for hallucinations.  All other systems reviewed and are negative.    Allergies  Corn-containing products; Eggs or egg-derived products; Latex; Codeine; Penicillins; and Sulfa antibiotics  Home Medications   Current Outpatient Rx  Name Route  Sig Dispense Refill  . ACETAMINOPHEN 500 MG PO TABS Oral Take 500 mg by mouth every 6 (six) hours as needed.    . ALBUTEROL SULFATE HFA 108 (90 BASE) MCG/ACT IN AERS Inhalation Inhale 2 puffs into the lungs every 6 (six) hours as needed.    . ALPRAZOLAM 1 MG PO TABS Oral Take 1 tablet (1 mg total) by mouth at bedtime as needed. 20 tablet 0  . ASPIRIN EC 81 MG PO TBEC Oral Take 1 tablet (81 mg total) by mouth daily.    . BUDESONIDE-FORMOTEROL FUMARATE 160-4.5 MCG/ACT IN AERO Inhalation Inhale 2 puffs into the lungs 2 (two) times daily. 1 Inhaler 12  . CETIRIZINE HCL 10 MG PO TABS Oral Take 10 mg by mouth  daily.    Marland Kitchen HYDROCODONE-ACETAMINOPHEN 5-325 MG PO TABS Oral Take 1 tablet by mouth Every 6 hours as needed.    . IPRATROPIUM-ALBUTEROL 0.5-2.5 (3) MG/3ML IN SOLN Nebulization Take 3 mLs by nebulization 2 (two) times daily.    Marland Kitchen METOPROLOL TARTRATE 25 MG PO TABS  Take as directed (by Dr Kendrick Fries)    . NYSTATIN 100000 UNIT/ML MT SUSP Oral Take 5 mLs by mouth 4 (four) times daily.    Marland Kitchen PANTOPRAZOLE SODIUM 40 MG PO TBEC Oral Take 40 mg by mouth daily.    Marland Kitchen PAROXETINE HCL 20 MG PO TABS Oral Take 40 mg by mouth daily.     Marland Kitchen TIOTROPIUM BROMIDE MONOHYDRATE 18 MCG IN CAPS Inhalation Place 1 capsule (18 mcg total) into inhaler and inhale daily. 30 capsule 12    Triage Vitals: BP 187/103  Pulse 83  Temp 97.4 F (36.3 C)  Resp 20  SpO2 95%  Vital signs normal except hypertension   Physical Exam  Nursing note and vitals reviewed. Constitutional: She is oriented to person, place, and time. She appears well-developed and well-nourished.  Non-toxic appearance. She does not appear ill.       Pt seems anxious and was vomiting through the exam.  HENT:  Head: Normocephalic and atraumatic.  Right Ear: External ear normal.  Left Ear: External ear normal.  Nose: Nose normal. No mucosal edema or rhinorrhea.  Mouth/Throat: Oropharynx is clear and moist and mucous membranes are normal. No dental abscesses or uvula swelling.  Eyes: Conjunctivae normal and EOM are normal. Pupils are equal, round, and reactive to light.  Neck: Normal range of motion and full passive range of motion without pain. Neck supple.  Cardiovascular: Normal rate, regular rhythm and normal heart sounds.  Exam reveals no gallop and no friction rub.   No murmur heard. Pulmonary/Chest: Effort normal and breath sounds normal. No respiratory distress. She has no wheezes. She has no rhonchi. She has no rales. She exhibits no tenderness and no crepitus.  Abdominal: Soft. Normal appearance and bowel sounds are normal. There is tenderness. There  is no rebound and no guarding.       Mild epigastric tenderness to palpation but no rebound or guarding.  Musculoskeletal: Normal range of motion. She exhibits no edema and no tenderness.       Moves all extremities well.   Neurological: She is alert and oriented to person, place, and time. She has normal strength. No cranial nerve deficit.  Skin: Skin is warm, dry and intact. No rash noted. No erythema. No pallor.  Psychiatric: Her speech is normal and behavior is normal. Her mood appears not anxious.       anxious    ED Course  Procedures (including  critical care time)   Medications  ondansetron (ZOFRAN) 4 MG/5ML solution (not administered)  potassium chloride SA (K-DUR,KLOR-CON) CR tablet 40 mEq (not administered)  promethazine (PHENERGAN) 25 MG suppository (not administered)  ondansetron (ZOFRAN) injection 4 mg (4 mg Intravenous Given 05/26/12 2045)  ondansetron (ZOFRAN) injection 4 mg (4 mg Intravenous Given 05/26/12 2121)  morphine 4 MG/ML injection 4 mg (4 mg Intravenous Given 05/26/12 2114)  acetaminophen (TYLENOL) tablet 650 mg (650 mg Oral Given 05/26/12 2205)  ketorolac (TORADOL) 30 MG/ML injection 30 mg (30 mg Intravenous Given 05/27/12 0012)  HYDROcodone-acetaminophen (NORCO/VICODIN) 5-325 MG per tablet 2 tablet (2 tablet Oral Given 05/27/12 0012)  potassium chloride SA (K-DUR,KLOR-CON) CR tablet 40 mEq (40 mEq Oral Given 05/27/12 0012)  promethazine (PHENERGAN) injection 25 mg (25 mg Intramuscular Given 05/27/12 0022)    DIAGNOSTIC STUDIES: Oxygen Saturation is 95% on room air, adequate by my interpretation.    COORDINATION OF CARE: 20:56--I evaluated the patient and we discussed a treatment plan including medications and chest x-ray to which the pt agreed. Pulse ox is 85-96% on RA during our interview  PT required nausea medications twice. Recheck at 23:00, still has some chest pain off and on, given toradol and oral hydrocodone. Her tests so far are normal, I feel she is  going through withdrawal of her narcotics.   00:30 nurses report she vomited after given her norco and potassium, states pt is requesting phenergan. Given phenergan IM.   Review of pulmonary visit on 75/17 57 year old female with COPD, admitted April 22, 2012 at [facility] ER with acute respiratory failure, confusion. She was transferred to [facility] found to have hypercarbic respiratory failure requiring vent support , pulmonary edema, and NSTEMI to PCCM service .  05/14/2012 Post Hospital follow up  Patient was admitted August 13 2 August 28 4 non-ST elevated MI, complicated by hypercarbic respiratory failure, acute COPD, exacerbation, and pulmonary edema. She required mechanical ventilatory support along with aggressive pulmonary hygiene with nebs, and steroids, along with diuresis.  Patient was seen by cardiology. Patient had a, previously normal cardiac catheterization in 2011. There was only minimal elevation in her troponin. Echo showed no apical hypokinesis. The right ventricle was mildly, moderately dilated and hypokinetic.  Patient did experience some acute encephalopathy, felt secondary to her hypercapnia and also, a previous use of high-dose benzodiazepines prior to admission  Discharged on o2 .  Since discharge she is feeling much better.  Wearing o2.  We discussed dangers of smoking and O2.  No hemoptysis. No edema. No chest pain   [place] Note while hospitalized Assessment/Plan:  Card: No elevation in CPK and minimal elevation in troponin. ECG normal this am and only showing signs of right heart strain  I reviewed her echo here read by [facility] and disagree. There is no apical hypokinesis. The RV is moderately dilated and hypokinetic  And the mid cavitary signal is mostly MR. She has no murmur on exam. Clean cath in 2011. No further cardiac w/u indicated Ok to transfer to  Floor. On ativan and xanax now with apparantly riduculously high doses being taken at home.  Will sign off   Charlton Haws  04/25/2012, 8:06 AM      Results for orders placed during the hospital encounter of 05/26/12  TROPONIN I      Component Value Range   Troponin I <0.30  <0.30 ng/mL  CBC WITH DIFFERENTIAL      Component Value Range   WBC 5.7  4.0 - 10.5  K/uL   RBC 4.95  3.87 - 5.11 MIL/uL   Hemoglobin 15.7 (*) 12.0 - 15.0 g/dL   HCT 16.1 (*) 09.6 - 04.5 %   MCV 96.6  78.0 - 100.0 fL   MCH 31.7  26.0 - 34.0 pg   MCHC 32.8  30.0 - 36.0 g/dL   RDW 40.9  81.1 - 91.4 %   Platelets 173  150 - 400 K/uL   Neutrophils Relative 73  43 - 77 %   Neutro Abs 4.2  1.7 - 7.7 K/uL   Lymphocytes Relative 22  12 - 46 %   Lymphs Abs 1.3  0.7 - 4.0 K/uL   Monocytes Relative 4  3 - 12 %   Monocytes Absolute 0.2  0.1 - 1.0 K/uL   Eosinophils Relative 0  0 - 5 %   Eosinophils Absolute 0.0  0.0 - 0.7 K/uL   Basophils Relative 1  0 - 1 %   Basophils Absolute 0.0  0.0 - 0.1 K/uL  COMPREHENSIVE METABOLIC PANEL      Component Value Range   Sodium 141  135 - 145 mEq/L   Potassium 3.4 (*) 3.5 - 5.1 mEq/L   Chloride 101  96 - 112 mEq/L   CO2 28  19 - 32 mEq/L   Glucose, Bld 151 (*) 70 - 99 mg/dL   BUN 6  6 - 23 mg/dL   Creatinine, Ser 7.82  0.50 - 1.10 mg/dL   Calcium 9.8  8.4 - 95.6 mg/dL   Total Protein 6.9  6.0 - 8.3 g/dL   Albumin 4.4  3.5 - 5.2 g/dL   AST 32  0 - 37 U/L   ALT 38 (*) 0 - 35 U/L   Alkaline Phosphatase 132 (*) 39 - 117 U/L   Total Bilirubin 0.4  0.3 - 1.2 mg/dL   GFR calc non Af Amer >90  >90 mL/min   GFR calc Af Amer >90  >90 mL/min  TROPONIN I      Component Value Range   Troponin I <0.30  <0.30 ng/mL   Laboratory interpretation all normal except mild hyper glycemia, mild hyponatremia   Dg Chest Portable 1 View  05/26/2012  *RADIOLOGY REPORT*  Clinical Data: Chest pain with nausea and vomiting  PORTABLE CHEST - 1 VIEW  Comparison: 04/24/2012  Findings: Cardiomegaly. COPD.  Clear lung fields.  Osteopenia. Improved aeration from priors.  IMPRESSION: Cardiomegaly, no active  disease.   Original Report Authenticated By: Elsie Stain, M.D.      Date: 05/26/2012  Rate: 86  Rhythm: normal sinus rhythm  QRS Axis: normal  Intervals: normal  ST/T Wave abnormalities: normal  Conduction Disutrbances:none  Narrative Interpretation:   Old EKG Reviewed: unchanged from 04/24/2012      1. Chest pain, atypical   2. Nausea vomiting and diarrhea     New Prescriptions   PROMETHAZINE (PHENERGAN) 25 MG SUPPOSITORY    Place 1 suppository (25 mg total) rectally every 6 (six) hours as needed for nausea.    Plan discharge  Devoria Albe, MD, FACEP   MDM   I personally performed the services described in this documentation, which was scribed in my presence. The recorded information has been reviewed and considered.  Devoria Albe, MD, FACEP   Ward Givens, MD 05/27/12 [phone]

## 2012-05-26 NOTE — ED Notes (Addendum)
Pt c/o sharp chest pain and pressure with nausea since 5:30pm, pt took 325 mg aspirin en route and 1 0.4 sl nitro

## 2012-05-26 NOTE — ED Notes (Signed)
Placed 3L  [place] O2 for decreased SpO2.

## 2012-05-27 ENCOUNTER — Observation Stay (HOSPITAL_COMMUNITY): Payer: Medicare Other

## 2012-05-27 ENCOUNTER — Encounter (HOSPITAL_COMMUNITY): Payer: Self-pay | Admitting: *Deleted

## 2012-05-27 ENCOUNTER — Inpatient Hospital Stay (HOSPITAL_COMMUNITY)
Admit: 2012-05-27 | Discharge: 2012-05-27 | Disposition: A | Discharge status: Home / Self Care | Payer: Medicare Other | Attending: Neurology | Admitting: Neurology

## 2012-05-27 DIAGNOSIS — R112 Nausea with vomiting, unspecified: Secondary | ICD-10-CM

## 2012-05-27 DIAGNOSIS — R569 Unspecified convulsions: Secondary | ICD-10-CM | POA: Diagnosis not present

## 2012-05-27 DIAGNOSIS — K219 Gastro-esophageal reflux disease without esophagitis: Secondary | ICD-10-CM

## 2012-05-27 DIAGNOSIS — R079 Chest pain, unspecified: Secondary | ICD-10-CM

## 2012-05-27 DIAGNOSIS — B1710 Acute hepatitis C without hepatic coma: Secondary | ICD-10-CM

## 2012-05-27 DIAGNOSIS — F17200 Nicotine dependence, unspecified, uncomplicated: Secondary | ICD-10-CM

## 2012-05-27 LAB — BLOOD GAS, ARTERIAL
Acid-Base Excess: 3.2 mmol/L — ABNORMAL HIGH (ref 0.0–2.0)
Bicarbonate: 28.6 mEq/L — ABNORMAL HIGH (ref 20.0–24.0)
TCO2: 24.3 mmol/L (ref 0–100)
pCO2 arterial: 55.7 mmHg — ABNORMAL HIGH (ref 35.0–45.0)
pH, Arterial: 7.331 — ABNORMAL LOW (ref 7.350–7.450)
pO2, Arterial: 249 mmHg — ABNORMAL HIGH (ref 80.0–100.0)

## 2012-05-27 LAB — LIPASE, BLOOD: Lipase: 15 U/L (ref 11–59)

## 2012-05-27 LAB — MRSA PCR SCREENING: MRSA by PCR: NEGATIVE

## 2012-05-27 MED ORDER — THIAMINE HCL 100 MG/ML IJ SOLN
100.0000 mg | Freq: Once | INTRAMUSCULAR | Status: AC
  Administered 2012-05-27: 100 mg via INTRAVENOUS
  Filled 2012-05-27: qty 2

## 2012-05-27 MED ORDER — PHENYTOIN SODIUM 50 MG/ML IJ SOLN
100.0000 mg | Freq: Three times a day (TID) | INTRAMUSCULAR | Status: DC
  Administered 2012-05-27 – 2012-05-30 (×8): 100 mg via INTRAVENOUS
  Filled 2012-05-27 (×8): qty 2

## 2012-05-27 MED ORDER — PROMETHAZINE HCL 25 MG/ML IJ SOLN
25.0000 mg | Freq: Once | INTRAMUSCULAR | Status: DC
  Filled 2012-05-27: qty 1

## 2012-05-27 MED ORDER — PHENYTOIN SODIUM EXTENDED 100 MG PO CAPS: 100.0000 mg | ORAL_CAPSULE | Freq: Three times a day (TID) | ORAL | Status: DC

## 2012-05-27 MED ORDER — ONDANSETRON HCL 4 MG/2ML IJ SOLN
4.0000 mg | Freq: Four times a day (QID) | INTRAMUSCULAR | Status: DC | PRN
  Administered 2012-05-30: 4 mg via INTRAVENOUS
  Filled 2012-05-27 (×2): qty 2

## 2012-05-27 MED ORDER — LORAZEPAM 2 MG/ML IJ SOLN
1.0000 mg | INTRAMUSCULAR | Status: DC | PRN
  Administered 2012-05-28 – 2012-06-03 (×9): 1 mg via INTRAVENOUS
  Filled 2012-05-27 (×10): qty 1

## 2012-05-27 MED ORDER — PROMETHAZINE HCL 25 MG/ML IJ SOLN
25.0000 mg | Freq: Once | INTRAMUSCULAR | Status: AC
  Administered 2012-05-27: 25 mg via INTRAMUSCULAR
  Filled 2012-05-27: qty 1

## 2012-05-27 MED ORDER — SODIUM CHLORIDE 0.9 % IV SOLN
1000.0000 mg | INTRAVENOUS | Status: AC
  Administered 2012-05-27: 1000 mg via INTRAVENOUS
  Filled 2012-05-27: qty 20

## 2012-05-27 MED ORDER — POTASSIUM CHLORIDE CRYS ER 20 MEQ PO TBCR: 40.0000 meq | EXTENDED_RELEASE_TABLET | Freq: Once | ORAL | Status: DC

## 2012-05-27 MED ORDER — ASPIRIN EC 81 MG PO TBEC
81.0000 mg | DELAYED_RELEASE_TABLET | Freq: Every day | ORAL | Status: DC
  Administered 2012-05-28 – 2012-06-04 (×8): 81 mg via ORAL
  Filled 2012-05-27 (×9): qty 1

## 2012-05-27 MED ORDER — LORAZEPAM 2 MG/ML IJ SOLN
INTRAMUSCULAR | Status: AC
  Administered 2012-05-27: 1 mg
  Filled 2012-05-27: qty 1

## 2012-05-27 MED ORDER — PROMETHAZINE HCL 25 MG RE SUPP: 25.0000 mg | Freq: Four times a day (QID) | RECTAL | Status: DC | PRN

## 2012-05-27 MED ORDER — PANTOPRAZOLE SODIUM 40 MG PO TBEC
40.0000 mg | DELAYED_RELEASE_TABLET | Freq: Every day | ORAL | Status: DC
  Administered 2012-05-28 – 2012-06-03 (×7): 40 mg via ORAL
  Filled 2012-05-27 (×8): qty 1

## 2012-05-27 MED ORDER — THIAMINE HCL 100 MG/ML IJ SOLN
100.0000 mg | Freq: Every day | INTRAMUSCULAR | Status: DC
  Administered 2012-05-28 – 2012-06-02 (×6): 100 mg via INTRAVENOUS
  Filled 2012-05-27 (×6): qty 2

## 2012-05-27 MED ORDER — THIAMINE HCL 100 MG/ML IJ SOLN: 100.0000 mg | Freq: Once | INTRAMUSCULAR | Status: DC

## 2012-05-27 MED ORDER — VITAMIN B-1 100 MG PO TABS: 100.0000 mg | ORAL_TABLET | Freq: Every day | ORAL | Status: DC

## 2012-05-27 MED ORDER — DIAZEPAM 5 MG PO TABS
5.0000 mg | ORAL_TABLET | Freq: Four times a day (QID) | ORAL | Status: DC
  Administered 2012-05-27 – 2012-05-30 (×13): 5 mg via ORAL
  Filled 2012-05-27 (×14): qty 1

## 2012-05-27 MED ORDER — POTASSIUM CHLORIDE IN NACL 20-0.9 MEQ/L-% IV SOLN
INTRAVENOUS | Status: AC
  Administered 2012-05-27: 06:00:00 via INTRAVENOUS

## 2012-05-27 MED ORDER — LORAZEPAM 2 MG/ML IJ SOLN
1.0000 mg | Freq: Once | INTRAMUSCULAR | Status: AC
  Administered 2012-05-27: 1 mg via INTRAVENOUS
  Filled 2012-05-27: qty 1

## 2012-05-27 MED ORDER — MORPHINE SULFATE 2 MG/ML IJ SOLN
1.0000 mg | INTRAMUSCULAR | Status: DC | PRN
  Administered 2012-05-27 – 2012-05-30 (×6): 1 mg via INTRAVENOUS
  Filled 2012-05-27 (×8): qty 1

## 2012-05-27 MED ORDER — ONDANSETRON HCL 4 MG PO TABS
4.0000 mg | ORAL_TABLET | Freq: Four times a day (QID) | ORAL | Status: DC | PRN
  Administered 2012-05-30 – 2012-06-01 (×4): 4 mg via ORAL
  Filled 2012-05-27 (×4): qty 1

## 2012-05-27 MED ORDER — SODIUM CHLORIDE 0.9 % IJ SOLN
3.0000 mL | Freq: Two times a day (BID) | INTRAMUSCULAR | Status: DC
  Administered 2012-05-27 – 2012-05-28 (×4): 3 mL via INTRAVENOUS
  Administered 2012-05-29: 10 mL via INTRAVENOUS
  Administered 2012-05-29: 3 mL via INTRAVENOUS
  Administered 2012-05-30: 06:00:00 via INTRAVENOUS
  Administered 2012-06-02 – 2012-06-03 (×4): 3 mL via INTRAVENOUS
  Filled 2012-05-27 (×6): qty 3

## 2012-05-27 MED ORDER — SODIUM CHLORIDE 0.9 % IJ SOLN
INTRAMUSCULAR | Status: AC
  Filled 2012-05-27: qty 3

## 2012-05-27 MED ORDER — TIOTROPIUM BROMIDE MONOHYDRATE 18 MCG IN CAPS
18.0000 ug | ORAL_CAPSULE | Freq: Every day | RESPIRATORY_TRACT | Status: DC
  Administered 2012-05-28 – 2012-06-04 (×7): 18 ug via RESPIRATORY_TRACT
  Filled 2012-05-27 (×2): qty 5

## 2012-05-27 MED ORDER — METOPROLOL TARTRATE 1 MG/ML IV SOLN
5.0000 mg | Freq: Once | INTRAVENOUS | Status: AC
  Administered 2012-05-27: 5 mg via INTRAVENOUS
  Filled 2012-05-27 (×2): qty 5

## 2012-05-27 MED ORDER — PAROXETINE HCL 20 MG PO TABS
40.0000 mg | ORAL_TABLET | Freq: Every day | ORAL | Status: DC
  Administered 2012-05-28 – 2012-06-04 (×8): 40 mg via ORAL
  Filled 2012-05-27 (×3): qty 2
  Filled 2012-05-27: qty 1
  Filled 2012-05-27 (×6): qty 2

## 2012-05-27 MED ORDER — BUDESONIDE-FORMOTEROL FUMARATE 160-4.5 MCG/ACT IN AERO
2.0000 | INHALATION_SPRAY | Freq: Two times a day (BID) | RESPIRATORY_TRACT | Status: DC
  Administered 2012-05-27 – 2012-06-04 (×15): 2 via RESPIRATORY_TRACT
  Filled 2012-05-27: qty 6

## 2012-05-27 NOTE — Progress Notes (Signed)
Patient arousable to verbal stimuli and tactile stimuli, speech slurred. Pupil assessment done, sluggish response. Hand grip absent, pt unable to follow commands. Blood pressure 116/78, pulse 123, respirations 16, O2 sats 78% on 2lpm via nasal cannula. Respiratory therapist notified. Oxygen increased to 5lpm via nasal cannula. O2 sats 95%. Seizure activity observed during assessment of patient. Dr. Karilyn Cota notified of change in mental status and seizure activity. New order for Ativan 1mg  IV stat, given. Patient continues to have seizure activity after Ativan given. Dr. Karilyn Cota notified. New orders to transfer patient to ICU, give another dose of Ativan 1mg  IV, and Dilantin 1,000mg  IVPB in normal saline. Ativan given as ordered. Dilantin given as ordered. Report called to ICU nurse Arline Asp. Patient transferred to ICU 5.

## 2012-05-27 NOTE — Progress Notes (Signed)
PT TO MRI FOR MRI OF HEAD. PT GIVEN ATIVAN 1MG  IV FOR AGITATION AND MOVEMENT. UNSUCESSFUL IN HELPING PT COMPLY TO MOVEMENT RESTRICTIONS. MRI WAS CONTINUED.

## 2012-05-27 NOTE — Progress Notes (Signed)
EEG completed as ordered at bedside °

## 2012-05-27 NOTE — ED Notes (Signed)
Patient to discharged; patient irate stating she wants to know what we are going to do about her blood pressure.  Dr. Dierdre Highman aware.

## 2012-05-27 NOTE — ED Notes (Signed)
Dr. Dierdre Highman at bedside to speak with patient.  Patient to be admitted.

## 2012-05-27 NOTE — Progress Notes (Signed)
     Subjective: This lady was admitted yesterday for diagnosis of gastroenteritis, likely viral. This morning she had a seizure which lasted at least 5 possibly 10 minutes. Had seen her during the seizure. The seizure stopped spontaneously. Arterial blood gas and MRI brain scan is pending. I do not see any record in her history of epilepsy or a seizure disorder and certainly she is not on any anticonvulsant medications. I did note that her blood pressure was elevated.           Physical Exam: Blood pressure 203/114, pulse 76, temperature 97.4 F (36.3 C), temperature source Oral, resp. rate 20, height 5\' 3"  (1.6 m), weight 65.59 kg (144 lb 9.6 oz), SpO2 94.00%. She appears to be postictal, sleepy. Heart sounds are present and normal lung fields appear to be clear. Abdomen is soft and no masses felt. Neurologically, not examined as she is clearly postictal at this point.   Investigations:     Basic Metabolic Panel:  [facility] 05/26/12 2108  NA 141  K 3.4*  CL 101  CO2 28  GLUCOSE 151*  BUN 6  CREATININE 0.59  CALCIUM 9.8  MG --  PHOS --   Liver Function Tests:  [facility] 05/26/12 2108  AST 32  ALT 38*  ALKPHOS 132*  BILITOT 0.4  PROT 6.9  ALBUMIN 4.4     CBC:  [facility] 05/26/12 2108  WBC 5.7  NEUTROABS 4.2  HGB 15.7*  HCT 47.8*  MCV 96.6  PLT 173    Dg Chest Portable 1 View  05/26/2012  *RADIOLOGY REPORT*  Clinical Data: Chest pain with nausea and vomiting  PORTABLE CHEST - 1 VIEW  Comparison: 04/24/2012  Findings: Cardiomegaly. COPD.  Clear lung fields.  Osteopenia. Improved aeration from priors.  IMPRESSION: Cardiomegaly, no active disease.   Original Report Authenticated By: Elsie Stain, M.D.       Medications: I have reviewed the patient's current medications.  Impression: 1. Gastroenteritis, likely viral. 2. New onset of seizure. 3. History of hepatitis C.     Plan: 1. MRI brain scan stat. 2. Arterial blood gas stat. Further  recommendations will depend on these results, in the meantime we will treat her for the gastroenteritis. She may need neurological consultation.     LOS: 1 day   Wilson Singer Pager [phone]  05/27/2012, 9:50 AM

## 2012-05-27 NOTE — Progress Notes (Addendum)
MEDICATION RELATED CONSULT NOTE - INITIAL   Pharmacy Consult for Dilantin Indication: new onset seizures  Allergies  Allergen Reactions  . Corn-Containing Products Shortness Of Breath and Swelling    REACTION: affects breathing  . Eggs Or Egg-Derived Products Shortness Of Breath  . Latex Shortness Of Breath, Itching and Rash  . Codeine Itching and Nausea And Vomiting  . Penicillins Other (See Comments)    REACTION: "thrush"  . Sulfa Antibiotics Other (See Comments)    REACTION: Unknown   Patient Measurements: Height: 5\' 3"  (160 cm) Weight: 144 lb 9.6 oz (65.59 kg) IBW/kg (Calculated) : 52.4   Vital Signs: Temp: 97.4 F (36.3 C) (09/16 0509) Temp src: Oral (09/16 0509) BP: 203/114 mmHg (09/16 0509) Pulse Rate: 108  (09/16 1017) Intake/Output from previous day:   Intake/Output from this shift:    Labs:  [facility] 05/26/12 2108  WBC 5.7  HGB 15.7*  HCT 47.8*  PLT 173  APTT --  CREATININE 0.59  LABCREA --  CREATININE 0.59  CREAT24HRUR --  MG --  PHOS --  ALBUMIN 4.4  PROT 6.9  ALBUMIN 4.4  AST 32  ALT 38*  ALKPHOS 132*  BILITOT 0.4  BILIDIR --  IBILI --   Estimated Creatinine Clearance: 70.7 ml/min (by C-G formula based on Cr of 0.59).  Medical History: Past Medical History  Diagnosis Date  . Coronary artery disease   . Degenerative disc disease   . Asthma   . Lupus   . Hepatitis C   . Heart attack    Medications:  Scheduled:    . acetaminophen      . acetaminophen  650 mg Oral Once  . aspirin EC  81 mg Oral Daily  . budesonide-formoterol  2 puff Inhalation BID  . HYDROcodone-acetaminophen  2 tablet Oral Once  . ketorolac  30 mg Intravenous Once  . LORazepam      . LORazepam  1 mg Intravenous Once  . metoprolol  5 mg Intravenous Once  . morphine  4 mg Intravenous Once  . ondansetron      . ondansetron  4 mg Intravenous Once  . ondansetron  4 mg Intravenous Once  . pantoprazole  40 mg Oral Daily  . PARoxetine  40 mg Oral Daily  .  phenytoin  100 mg Oral TID  . potassium chloride SA  40 mEq Oral Once  . promethazine  25 mg Intramuscular Once  . sodium chloride  3 mL Intravenous Q12H  . tiotropium  18 mcg Inhalation Daily  . DISCONTD: potassium chloride SA  40 mEq Oral Once  . DISCONTD: promethazine  25 mg Intramuscular Once   Assessment: 57yo female with new onset seizures during this admission. This lady was admitted yesterday for diagnosis of gastroenteritis, likely viral. This morning she had a seizure which lasted at least 5 possibly 10 minutes. Pt was observed by staff during the seizure. The seizure stopped spontaneously. Arterial blood gas and MRI brain scan is pending. I do not see any record in her history of epilepsy or a seizure disorder and certainly she is not on any anticonvulsant medications. I did note that her blood pressure was elevated.  Nurse reports more seizures since 1st one was observed.  Pt is going to be transferred to ICU per RN.   Goal of Therapy:  Seizure control and corrected phenytoin level 10-20   Plan:  Dilantin 1000mg  iv now x 1 dose then 100mg  iv q8hrs Check trough level at steady state When  therapeutic dose established, change to once nightly at bedtime. F/U recommendations from neurology  Valrie Hart A 05/27/2012,10:43 AM

## 2012-05-27 NOTE — H&P (Signed)
PCP:   Louie Boston, MD   Chief Complaint:  n/v  HPI: 56 yo female with one day of n/v and epi abd pain.  No diarrhea.  No fevers.  No cp.  No sob.  No sick contacts.  Cannot hold anything down.  Review of Systems:  O/w neg  Past Medical History: Past Medical History  Diagnosis Date  . Coronary artery disease   . Degenerative disc disease   . Asthma   . Lupus   . Hepatitis C   . Heart attack    Past Surgical History  Procedure Date  . Cholecystectomy   . Appendectomy   . Tonsillectomy   . Tubal ligation   . Polyps on vocal cords     Medications: Prior to Admission medications   Medication Sig Start Date End Date Taking? Authorizing Provider  acetaminophen (TYLENOL) 500 MG tablet Take 500 mg by mouth every 6 (six) hours as needed. pain   Yes Historical Provider, MD  albuterol (PROAIR HFA) 108 (90 BASE) MCG/ACT inhaler Inhale 2 puffs into the lungs every 6 (six) hours as needed. Shortness of breath   Yes Historical Provider, MD  ALPRAZolam Prudy Feeler) 1 MG tablet Take 1 mg by mouth at bedtime as needed. anxiety 04/26/12  Yes Zannie Cove, MD  aspirin EC 81 MG tablet Take 1 tablet (81 mg total) by mouth daily. 05/09/12 05/09/13 Yes Prescott Parma, PA  budesonide-formoterol (SYMBICORT) 160-4.5 MCG/ACT inhaler Inhale 2 puffs into the lungs 2 (two) times daily. 04/26/12 04/26/13 Yes Zannie Cove, MD  cetirizine (ZYRTEC) 10 MG tablet Take 10 mg by mouth daily.   Yes Historical Provider, MD  HYDROcodone-acetaminophen (NORCO/VICODIN) 5-325 MG per tablet Take 1 tablet by mouth Every 6 hours as needed. pain 05/09/12  Yes Historical Provider, MD  ipratropium-albuterol (DUONEB) 0.5-2.5 (3) MG/3ML SOLN Take 3 mLs by nebulization 2 (two) times daily.   Yes Historical Provider, MD  pantoprazole (PROTONIX) 40 MG tablet Take 40 mg by mouth daily.   Yes Historical Provider, MD  PARoxetine (PAXIL) 20 MG tablet Take 40 mg by mouth daily.    Yes Historical Provider, MD  tiotropium (SPIRIVA  HANDIHALER) 18 MCG inhalation capsule Place 1 capsule (18 mcg total) into inhaler and inhale daily. 04/26/12 04/26/13 Yes Zannie Cove, MD  nystatin (MYCOSTATIN) 100000 UNIT/ML suspension Take 5 mLs by mouth 4 (four) times daily.    Historical Provider, MD  promethazine (PHENERGAN) 25 MG suppository Place 1 suppository (25 mg total) rectally every 6 (six) hours as needed for nausea. 05/27/12 06/03/12  Ward Givens, MD    Allergies:   Allergies  Allergen Reactions  . Corn-Containing Products Shortness Of Breath and Swelling    REACTION: affects breathing  . Eggs Or Egg-Derived Products Shortness Of Breath  . Latex Shortness Of Breath, Itching and Rash  . Codeine Itching and Nausea And Vomiting  . Penicillins Other (See Comments)    REACTION: "thrush"  . Sulfa Antibiotics Other (See Comments)    REACTION: Unknown    Social History:  reports that she has been smoking Cigarettes.  She has a 22 pack-year smoking history. She does not have any smokeless tobacco history on file. She reports that she drinks alcohol. She reports that she does not use illicit drugs.  Family History: History reviewed. No pertinent family history.  Physical Exam: Filed Vitals:   05/26/12 2200 05/26/12 2250 05/27/12 0021 05/27/12 0216  BP: 140/112 160/120 174/96 202/108  Pulse: 89 82 86 86  Temp:  Resp: 24 20 17 24   SpO2: 87% 100% 97% 96%   General appearance: alert, cooperative and no distress Lungs: clear to auscultation bilaterally Heart: regular rate and rhythm, S1, S2 normal, no murmur, click, rub or gallop Abdomen: soft, non-tender; bowel sounds normal; no masses,  no organomegaly Extremities: extremities normal, atraumatic, no cyanosis or edema Pulses: 2+ and symmetric Skin: Skin color, texture, turgor normal. No rashes or lesions Neurologic: Grossly normal    Labs on Admission:   [facility] 05/26/12 2108  NA 141  K 3.4*  CL 101  CO2 28  GLUCOSE 151*  BUN 6  CREATININE 0.59  CALCIUM  9.8  MG --  PHOS --    Basename 05/26/12 2108  AST 32  ALT 38*  ALKPHOS 132*  BILITOT 0.4  PROT 6.9  ALBUMIN 4.4    Basename 05/26/12 2108  WBC 5.7  NEUTROABS 4.2  HGB 15.7*  HCT 47.8*  MCV 96.6  PLT 173    Basename 05/26/12 2254 05/26/12 2108  CKTOTAL -- --  CKMB -- --  CKMBINDEX -- --  TROPONINI <0.30 <0.30   Radiological Exams on Admission: Dg Chest Portable 1 View  05/26/2012  *RADIOLOGY REPORT*  Clinical Data: Chest pain with nausea and vomiting  PORTABLE CHEST - 1 VIEW  Comparison: 04/24/2012  Findings: Cardiomegaly. COPD.  Clear lung fields.  Osteopenia. Improved aeration from priors.  IMPRESSION: Cardiomegaly, no active disease.   Original Report Authenticated By: Elsie Stain, M.D.     Assessment/Plan Present on Admission:  56 yo female with likely viral gastroenteritis .Nausea & vomiting .Chest pain .GASTROESOPHAGEAL REFLUX DISEASE .HEPATITIS C  Likely VGE.  Supportive care.  Labs nml.  ekg nsr.  Ivf.  Benign abd exam.  DAVID,RACHAL A [phone] 05/27/2012, 4:20 AM

## 2012-05-27 NOTE — Consult Note (Signed)
[facility] Antoney Biven [name], MD     [email or web address]         The patient is a 56 year old white female who was admitted to the hospital because of abdominal discomfort and nausea and vomiting. While she was on the floor, the patient developed a flurry of seizures consecutively which resulted in the patient being transported to the ICU. The patient is known to my service in the outpatient setting. She was being treated for fibromyalgia and chronic pain problems. The patient has been admitted to the hospital in [place] 2 years ago for withdrawal seizure. There is a significant history of alcoholism and polysubstance drug abuse. The patient has used her crack cocaine, heroine and opioids during the 1990s. She apparently stopped using these substances in 2001. The patient was loaded with IV Dilantin and placed in a maintenance dose. She was given several doses of IV Ativan.  GENERAL: The patient is somewhat irritated and agitated.  HEENT: Supple. Atraumatic normocephalic.   ABDOMEN: soft  EXTREMITIES: No edema   BACK: Normal.  SKIN: Normal by inspection.    MENTAL STATUS: The patient is awake and alert but confused and disoriented. She seemed to have visual hallucinations. She has nonsensical speech at times and is fixated on her back pain. She keeps mentioning Dr. Channing Mutters the neurosurgeon's name. She confabulates a lot.  CRANIAL NERVES: Pupils are equal, round and reactive to light and accomodation; extra ocular movements are full, there is no significant nystagmus; visual fields are full; upper and lower facial muscles are normal in strength and symmetric, there is no flattening of the nasolabial folds; tongue is midline.  MOTOR: Normal tone, bulk and strength; no pronator drift.  COORDINATION: Left finger to nose is normal, right finger to nose is normal, No rest tremor; no intention tremor; no postural tremor; no bradykinesia.  REFLEXES: Deep tendon reflexes are symmetrical but  pathologically brisk 3+ throughout in the upper and lower extremities. Babinski reflexes are flexor bilaterally.   SENSATION: She responds to deep painful stimuli bilaterally.  The patient's brain MRI is reviewed and person. There is a lot of motion artefact which limits the interpretation. However, there is nothing significant seen on diffusion imaging. On the FLAIR imaging, there is significantly abnormal hyperintensity involving the thalami bilaterally. Similar findings are seen but to a lesser degree involving the left lateral temporal region and parasagittal regions bilaterally almost did 1 set this patient. I do not see the typical posterior occipital periventricular increased signal seen in posterior encephalopathy syndromes.      A/P:  New onset seizures with status epilepticus. The etiology could be due to hypertensive encephalopathy/posterior reversible encephalopathy syndrome. However, the patient does have a history of alcohol withdrawal seizures and the polysubstance drug abuse. The patient's EEG will be followed. She likely should be placed on DT precautions. A urine toxicology test results is also just suggested. Also we should check a Dilantin level in the morning.   After reviewing the MRI findings in detail, believe the patient's clinical picture and MRI findings seems most consistent with Wernicke's encephalopathy. I also suspect that the patient had alcohol withdrawal seizures. The patient will be given a stat dose of thiamine today and started on a maintenance dose. I'll also go ahead and place the patient on benzodiazepines.

## 2012-05-27 NOTE — Progress Notes (Signed)
UR Chart Review Completed  

## 2012-05-27 NOTE — ED Notes (Signed)
Patient states she is still vomiting and is worried about her blood pressure.

## 2012-05-27 NOTE — ED Notes (Signed)
Took medications and vomiting immediately after, states her nausea continues.

## 2012-05-27 NOTE — Consult Note (Signed)
Reason for Consult: Referring Physician:   [name] is an 56 y.o. female.  HPI:   Past Medical History  Diagnosis Date  . Coronary artery disease   . Degenerative disc disease   . Asthma   . Lupus   . Hepatitis C   . Heart attack     Past Surgical History  Procedure Date  . Cholecystectomy   . Appendectomy   . Tonsillectomy   . Tubal ligation   . Polyps on vocal cords   . Cardiac catheterization     History reviewed. No pertinent family history.  Social History:  reports that she quit smoking today. Her smoking use included Cigarettes. She has a 22 pack-year smoking history. She quit smokeless tobacco use today. She reports that she drinks alcohol. She reports that she does not use illicit drugs.  Allergies:  Allergies  Allergen Reactions  . Corn-Containing Products Shortness Of Breath and Swelling    REACTION: affects breathing  . Eggs Or Egg-Derived Products Shortness Of Breath  . Latex Shortness Of Breath, Itching and Rash  . Codeine Itching and Nausea And Vomiting  . Penicillins Other (See Comments)    REACTION: "thrush"  . Sulfa Antibiotics Other (See Comments)    REACTION: Unknown    Medications:  No current facility-administered medications on file prior to encounter.   Current Outpatient Prescriptions on File Prior to Encounter  Medication Sig Dispense Refill  . acetaminophen (TYLENOL) 500 MG tablet Take 500 mg by mouth every 6 (six) hours as needed. pain      . albuterol (PROAIR HFA) 108 (90 BASE) MCG/ACT inhaler Inhale 2 puffs into the lungs every 6 (six) hours as needed. Shortness of breath      . aspirin EC 81 MG tablet Take 1 tablet (81 mg total) by mouth daily.      . budesonide-formoterol (SYMBICORT) 160-4.5 MCG/ACT inhaler Inhale 2 puffs into the lungs 2 (two) times daily.  1 Inhaler  12  . cetirizine (ZYRTEC) 10 MG tablet Take 10 mg by mouth daily.      Marland Kitchen HYDROcodone-acetaminophen (NORCO/VICODIN) 5-325 MG per tablet Take 1 tablet by mouth  Every 6 hours as needed. pain      . ipratropium-albuterol (DUONEB) 0.5-2.5 (3) MG/3ML SOLN Take 3 mLs by nebulization 2 (two) times daily.      . pantoprazole (PROTONIX) 40 MG tablet Take 40 mg by mouth daily.      Marland Kitchen PARoxetine (PAXIL) 20 MG tablet Take 40 mg by mouth daily.       Marland Kitchen tiotropium (SPIRIVA HANDIHALER) 18 MCG inhalation capsule Place 1 capsule (18 mcg total) into inhaler and inhale daily.  30 capsule  12  . nystatin (MYCOSTATIN) 100000 UNIT/ML suspension Take 5 mLs by mouth 4 (four) times daily.      . promethazine (PHENERGAN) 25 MG suppository Place 1 suppository (25 mg total) rectally every 6 (six) hours as needed for nausea.  12 each  0    Scheduled Meds:   . acetaminophen      . acetaminophen  650 mg Oral Once  . aspirin EC  81 mg Oral Daily  . budesonide-formoterol  2 puff Inhalation BID  . HYDROcodone-acetaminophen  2 tablet Oral Once  . ketorolac  30 mg Intravenous Once  . LORazepam      . LORazepam  1 mg Intravenous Once  . metoprolol  5 mg Intravenous Once  . morphine  4 mg Intravenous Once  . ondansetron      .  ondansetron  4 mg Intravenous Once  . ondansetron  4 mg Intravenous Once  . pantoprazole  40 mg Oral Daily  . PARoxetine  40 mg Oral Daily  . phenytoin (DILANTIN) IV  1,000 mg Intravenous NOW  . phenytoin (DILANTIN) IV  100 mg Intravenous Q8H  . potassium chloride SA  40 mEq Oral Once  . promethazine  25 mg Intramuscular Once  . sodium chloride  3 mL Intravenous Q12H  . sodium chloride      . tiotropium  18 mcg Inhalation Daily  . DISCONTD: phenytoin  100 mg Oral TID  . DISCONTD: potassium chloride SA  40 mEq Oral Once  . DISCONTD: promethazine  25 mg Intramuscular Once   Continuous Infusions:   . 0.9 % NaCl with KCl 20 mEq / L 75 mL/hr at 05/27/12 1700   PRN Meds:.LORazepam, morphine injection, ondansetron (ZOFRAN) IV, ondansetron   Results for orders placed during the hospital encounter of 05/26/12 (from the past 48 hour(s))  TROPONIN I      Status: Normal   Collection Time   05/26/12  9:08 PM      Component Value Range Comment   Troponin I <0.30  <0.30 ng/mL   CBC WITH DIFFERENTIAL     Status: Abnormal   Collection Time   05/26/12  9:08 PM      Component Value Range Comment   WBC 5.7  4.0 - 10.5 K/uL    RBC 4.95  3.87 - 5.11 MIL/uL    Hemoglobin 15.7 (*) 12.0 - 15.0 g/dL    HCT 84.6 (*) 96.2 - 46.0 %    MCV 96.6  78.0 - 100.0 fL    MCH 31.7  26.0 - 34.0 pg    MCHC 32.8  30.0 - 36.0 g/dL    RDW 95.2  84.1 - 32.4 %    Platelets 173  150 - 400 K/uL    Neutrophils Relative 73  43 - 77 %    Neutro Abs 4.2  1.7 - 7.7 K/uL    Lymphocytes Relative 22  12 - 46 %    Lymphs Abs 1.3  0.7 - 4.0 K/uL    Monocytes Relative 4  3 - 12 %    Monocytes Absolute 0.2  0.1 - 1.0 K/uL    Eosinophils Relative 0  0 - 5 %    Eosinophils Absolute 0.0  0.0 - 0.7 K/uL    Basophils Relative 1  0 - 1 %    Basophils Absolute 0.0  0.0 - 0.1 K/uL   COMPREHENSIVE METABOLIC PANEL     Status: Abnormal   Collection Time   05/26/12  9:08 PM      Component Value Range Comment   Sodium 141  135 - 145 mEq/L    Potassium 3.4 (*) 3.5 - 5.1 mEq/L    Chloride 101  96 - 112 mEq/L    CO2 28  19 - 32 mEq/L    Glucose, Bld 151 (*) 70 - 99 mg/dL    BUN 6  6 - 23 mg/dL    Creatinine, Ser 4.01  0.50 - 1.10 mg/dL    Calcium 9.8  8.4 - 02.7 mg/dL    Total Protein 6.9  6.0 - 8.3 g/dL    Albumin 4.4  3.5 - 5.2 g/dL    AST 32  0 - 37 U/L    ALT 38 (*) 0 - 35 U/L    Alkaline Phosphatase 132 (*) 39 - 117 U/L  Total Bilirubin 0.4  0.3 - 1.2 mg/dL    GFR calc non Af Amer >90  >90 mL/min    GFR calc Af Amer >90  >90 mL/min   TROPONIN I     Status: Normal   Collection Time   05/26/12 10:54 PM      Component Value Range Comment   Troponin I <0.30  <0.30 ng/mL   LIPASE, BLOOD     Status: Normal   Collection Time   05/26/12 10:54 PM      Component Value Range Comment   Lipase 15  11 - 59 U/L   BLOOD GAS, ARTERIAL     Status: Abnormal   Collection Time   05/27/12   9:14 AM      Component Value Range Comment   FIO2 100.00      Delivery systems OXYGEN MASK      pH, Arterial 7.331 (*) 7.350 - 7.450    pCO2 arterial 55.7 (*) 35.0 - 45.0 mmHg    pO2, Arterial 249.0 (*) 80.0 - 100.0 mmHg    Bicarbonate 28.6 (*) 20.0 - 24.0 mEq/L    TCO2 24.3  0 - 100 mmol/L    Acid-Base Excess 3.2 (*) 0.0 - 2.0 mmol/L    O2 Saturation 99.2      Patient temperature 37.0      Collection site RIGHT RADIAL      Drawn by [phone]      Sample type ARTERIAL DRAW      Allens test (pass/fail) PASS  PASS   MRSA PCR SCREENING     Status: Normal   Collection Time   05/27/12 12:00 PM      Component Value Range Comment   MRSA by PCR NEGATIVE  NEGATIVE     Mr Brain Wo Contrast  05/27/2012  *RADIOLOGY REPORT*  Clinical Data: 56 year old female with new onset seizure, altered mental status. Blood pressure of 187/103 upon presentation to the ER.  MRI HEAD WITHOUT CONTRAST  Technique:  Multiplanar, multiecho pulse sequences of the brain and surrounding structures were obtained according to standard protocol without intravenous contrast.  Comparison: Head CTs without contrast [facility] 11/27/2010 (no  images, report only) and 02/13/2010 (images present).  Findings: Study is intermittently degraded by motion artifact despite repeated imaging attempts.  No definite restricted diffusion to suggest acute infarction.  No ventriculomegaly. No midline shift, mass effect, or evidence of mass lesion.  No definite acute intracranial hemorrhage.  Negative pituitary, cervicomedullary junction and visualized cervical spine.  Major intracranial vascular flow voids are grossly preserved.  There is patchy FLAIR and possibly also at T2 hyperintensity in the cortex and subcortical white matter of the superior frontal gyri and parietal lobes in a somewhat symmetric distribution.  There is also evidence of left posterior temporal and occipital lobe involvement.  There is also evidence of abnormal FLAIR  and T2 signal in the medial thalami (series 8 image 14).  Other deep gray matter nuclei appear within normal limits.  Brainstem and cerebellum appear grossly normal.  Normal marrow signal.  Grossly negative orbits, paranasal sinuses and mastoids.  IMPRESSION: 1.  Abnormal gray and white matter signal in a patchy quasi symmetric pattern over the superior and posterior convexities, and involving the medial thalami.  Favor posterior reversible encephalopathy syndrome. Less likely considerations include ADEM, and encephalitis due to viral or atypical etiology. 2.  Motion degraded exam despite repeated imaging attempts.   Original Report Authenticated By: Harley Hallmark, M.D.  Dg Chest Portable 1 View  05/26/2012  *RADIOLOGY REPORT*  Clinical Data: Chest pain with nausea and vomiting  PORTABLE CHEST - 1 VIEW  Comparison: 04/24/2012  Findings: Cardiomegaly. COPD.  Clear lung fields.  Osteopenia. Improved aeration from priors.  IMPRESSION: Cardiomegaly, no active disease.   Original Report Authenticated By: Elsie Stain, M.D.     Review of Systems  Unable to perform ROS: mental status change   Blood pressure 147/76, pulse 99, temperature 97.4 F (36.3 C), temperature source Oral, resp. rate 23, height 5\' 3"  (1.6 m), weight 63.3 kg (139 lb 8.8 oz), SpO2 96.00%. Physical Exam  Assessment/Plan: New onset seizures with status epilepticus. The etiology could be due to hypertensive encephalopathy/posterior reversible encephalopathy syndrome. However, the patient does have a history of alcohol withdrawal seizures and the polysubstance drug abuse. The patient's EEG will be followed. She likely should be placed on DT precautions. A urine toxicology test results is also just suggested. Also we should check a Dilantin level in the morning. Also see dictation and orders.    Deryl Ports 05/27/2012, 8:06 PM

## 2012-05-27 NOTE — Procedures (Signed)
Donna [name], Donna [name]               ACCOUNT NO.:  [id number]  MEDICAL RECORD NO.:  [id number]  LOCATION:  IC05                          FACILITY:  [facility]  PHYSICIAN:  Venda Dice [name], M.D. DATE OF BIRTH:  05-28-1956  DATE OF PROCEDURE: DATE OF DISCHARGE:                             EEG INTERPRETATION   This is a 56 year old female who presents with multiple seizures.  She actually presented to the hospital with abdominal pain, but subsequently developed multiple seizures while hospitalized.  ANALYSIS:  A 16-channel recording using standard 10/20 measurements is conducted for 21 minute.  There is a posterior dominant rhythm of 7 Hz, which attenuates with eye opening.  There is beta activity observed in the frontal areas.  There is sleep architecture seen with K complexes and sleep spindles.  No hyperventilation or photic stimulation is carried out.  There is no focal or lateralized slowing.  There is no epileptiform activity observed.  IMPRESSION:  Mild generalized slowing, although awake and drowsy state; however, there is no epileptiform activity observed.     Arminta Gamm [name], M.D.     KAD/MEDQ  D:  05/27/2012  T:  05/27/2012  Job:  [id number]

## 2012-05-28 DIAGNOSIS — E512 Wernicke's encephalopathy: Secondary | ICD-10-CM | POA: Diagnosis present

## 2012-05-28 DIAGNOSIS — R0789 Other chest pain: Secondary | ICD-10-CM

## 2012-05-28 LAB — RAPID URINE DRUG SCREEN, HOSP PERFORMED
Barbiturates: NOT DETECTED
Benzodiazepines: POSITIVE — AB
Tetrahydrocannabinol: NOT DETECTED

## 2012-05-28 LAB — BASIC METABOLIC PANEL
Calcium: 9.8 mg/dL (ref 8.4–10.5)
GFR calc non Af Amer: >90 mL/min (ref >90)
Sodium: 138 mEq/L (ref 135–145)

## 2012-05-28 LAB — CBC
MCH: 31.7 pg (ref 26.0–34.0)
Platelets: 180 10*3/uL (ref 150–400)
RBC: 5.43 MIL/uL — ABNORMAL HIGH (ref 3.87–5.11)
WBC: 12.3 10*3/uL — ABNORMAL HIGH (ref 4.0–10.5)

## 2012-05-28 MED ORDER — LISINOPRIL 10 MG PO TABS
20.0000 mg | ORAL_TABLET | Freq: Every day | ORAL | Status: DC
  Administered 2012-05-28 – 2012-06-03 (×7): 20 mg via ORAL
  Filled 2012-05-28 (×7): qty 2

## 2012-05-28 MED ORDER — POTASSIUM CHLORIDE CRYS ER 20 MEQ PO TBCR
40.0000 meq | EXTENDED_RELEASE_TABLET | Freq: Two times a day (BID) | ORAL | Status: AC
  Administered 2012-05-28 (×2): 40 meq via ORAL
  Filled 2012-05-28 (×2): qty 2

## 2012-05-28 MED ORDER — FOLIC ACID 1 MG PO TABS
1.0000 mg | ORAL_TABLET | Freq: Every day | ORAL | Status: DC
  Administered 2012-05-28 – 2012-06-04 (×8): 1 mg via ORAL
  Filled 2012-05-28 (×8): qty 1

## 2012-05-28 MED ORDER — SODIUM CHLORIDE 0.9 % IJ SOLN
INTRAMUSCULAR | Status: AC
  Filled 2012-05-28: qty 3

## 2012-05-28 NOTE — Progress Notes (Signed)
[facility] [place] Lebeau [name], MD     [email or web address]        SUBJECTIVE: No recurrent seizures are reported. This patient is still confused however.  OBJECTIVE: GENERAL: She is somewhat tremulous but in no acute distress.  HEENT: Neck is supple.  ABDOMEN: soft  EXTREMITIES: No edema   SKIN: Normal by inspection.    MENTAL STATUS: The patient is awake. She continues to have significant confabulation and perseveration. She does attempt to follow commands however. She is markedly confused however. She seems to talk to herself at times and still has hallucinations that are visual.  CRANIAL NERVES: Pupils are equal, round and reactive to light and accomodation; extra ocular movements are full, there is no significant nystagmus; visual fields are full; upper and lower facial muscles are normal in strength and symmetric, there is no flattening of the nasolabial folds.  MOTOR: Normal tone, bulk and strength; no pronator drift.  COORDINATION: Left finger to nose is normal, right finger to nose is normal, No rest tremor; no intention tremor; no postural tremor; no bradykinesia.  EEG shows mild generalized slowing but no epileptiform activity.  ASSESSMENT/PLAN:  1. Wernicke's encephalopathy. The patient is on thiamine. She also is started on Valium to help with potential DTs. Her blood pressure is elevated. The hospitalist has added lisinopril. We will have to keep a close watch on this. Additional dementia labs were ordered and are pending. 2. Status epilepticus resolved. This is likely due to alcohol withdrawal. However, she is on phenytoin for now. We will continue this.  Beryle Beams, MD Diplomate, American Board of Psychiatry and Neurology (Neurology).

## 2012-05-28 NOTE — Progress Notes (Signed)
An attempt was made to do a PT eval, but I was unsuccessful.  The pt would awaken, but she was very confused and unable to follow any directions.  We will need her cognitive status to improve before an eval would accurately reflect her mobility status.  Will try again tomorrow.

## 2012-05-28 NOTE — Care Management Note (Signed)
    Page 1 of 1   06/04/2012     11:47:10 AM   CARE MANAGEMENT NOTE 06/04/2012  Patient:  Donna [name], Donna [name]   Account Number:  [id number]  Date Initiated:  05/28/2012  Documentation initiated by:  Sharrie Rothman  Subjective/Objective Assessment:   Pt admitted from home with gastroenteritis and etoh withdrawal. Pt is confused but stated that she lives alone. Pt stated that she has a sister but no children. Emergency contact lists a son.     Action/Plan:   CM will continue to follow for HH/CM needs. Once confusion clears, will assess again for HH/CM needs.   Anticipated DC Date:  05/30/2012   Anticipated DC Plan:  HOME/SELF CARE      DC Planning Services  CM consult      Choice offered to / List presented to:             Status of service:  Completed, signed off Medicare Important Message given?  YES (If response is "NO", the following Medicare IM given date fields will be blank) Date Medicare IM given:  05/31/2012 Date Additional Medicare IM given:  06/04/2012  Discharge Disposition:  HOME/SELF CARE  Per UR Regulation:    If discussed at Long Length of Stay Meetings, dates discussed:   06/04/2012    Comments:  06/04/12 1146 Arlyss Queen, RN BSN CM Pt discharged home today. No CM or HH needs noted.  05/31/12 1409 Arlyss Queen, RN BSN CM Pt to be discharged home on 06/01/12.  05/28/12 1512 Arlyss Queen, RN BSN CM

## 2012-05-28 NOTE — Progress Notes (Signed)
Subjective: This lady overnight has been more stable with less seizures. She was started on intravenous Dilantin. She has been awake, confused. Her blood pressure has been elevated. Dr. [name], neurology, knows this patient from the past and feels that the patient likely has Wernicke's encephalopathy.           Physical Exam: Blood pressure 169/95, pulse 97, temperature 97.8 F (36.6 C), temperature source Oral, resp. rate 19, height 5\' 3"  (1.6 m), weight 63.7 kg (140 lb 6.9 oz), SpO2 97.00%. She is awake this morning and alert. However she does not really nowhere she is. She does know her name and her date of birth. She keep repeating her date of birth. In fact she is not actually understanding my questions except for one time and when I change the subject she will ounce of the same question which is her date of birth. I believe she is probably confabulating. This would be in keeping with Wernicke's encephalopathy. There are no is focal neurological signs. Heart sounds are present and normal. Lung fields are clear .   Investigations:     Basic Metabolic Panel:  Basename 05/28/12 0416 05/26/12 2108  NA 138 141  K 2.9* 3.4*  CL 96 101  CO2 29 28  GLUCOSE 127* 151*  BUN 8 6  CREATININE 0.53 0.59  CALCIUM 9.8 9.8  MG -- --  PHOS -- --   Liver Function Tests:  [facility] 05/26/12 2108  AST 32  ALT 38*  ALKPHOS 132*  BILITOT 0.4  PROT 6.9  ALBUMIN 4.4     CBC:  Basename 05/28/12 0416 05/26/12 2108  WBC 12.3* 5.7  NEUTROABS -- 4.2  HGB 17.2* 15.7*  HCT 51.8* 47.8*  MCV 95.4 96.6  PLT 180 173    Mr Brain Wo Contrast  05/27/2012  *RADIOLOGY REPORT*  Clinical Data: 56 year old female with new onset seizure, altered mental status. Blood pressure of 187/103 upon presentation to the ER.  MRI HEAD WITHOUT CONTRAST  Technique:  Multiplanar, multiecho pulse sequences of the brain and surrounding structures were obtained according to standard protocol without  intravenous contrast.  Comparison: Head CTs without contrast [facility] 11/27/2010 (no  images, report only) and 02/13/2010 (images present).  Findings: Study is intermittently degraded by motion artifact despite repeated imaging attempts.  No definite restricted diffusion to suggest acute infarction.  No ventriculomegaly. No midline shift, mass effect, or evidence of mass lesion.  No definite acute intracranial hemorrhage.  Negative pituitary, cervicomedullary junction and visualized cervical spine.  Major intracranial vascular flow voids are grossly preserved.  There is patchy FLAIR and possibly also at T2 hyperintensity in the cortex and subcortical white matter of the superior frontal gyri and parietal lobes in a somewhat symmetric distribution.  There is also evidence of left posterior temporal and occipital lobe involvement.  There is also evidence of abnormal FLAIR and T2 signal in the medial thalami (series 8 image 14).  Other deep gray matter nuclei appear within normal limits.  Brainstem and cerebellum appear grossly normal.  Normal marrow signal.  Grossly negative orbits, paranasal sinuses and mastoids.  IMPRESSION: 1.  Abnormal gray and white matter signal in a patchy quasi symmetric pattern over the superior and posterior convexities, and involving the medial thalami.  Favor posterior reversible encephalopathy syndrome. Less likely considerations include ADEM, and encephalitis due to viral or atypical etiology. 2.  Motion degraded exam despite repeated imaging attempts.   Original Report Authenticated By: Ulla Potash III,  M.D.    Dg Chest Portable 1 View  05/26/2012  *RADIOLOGY REPORT*  Clinical Data: Chest pain with nausea and vomiting  PORTABLE CHEST - 1 VIEW  Comparison: 04/24/2012  Findings: Cardiomegaly. COPD.  Clear lung fields.  Osteopenia. Improved aeration from priors.  IMPRESSION: Cardiomegaly, no active disease.   Original Report Authenticated By: Elsie Stain, M.D.        Medications: I have reviewed the patient's current medications.  Impression: 1. Gastroenteritis, likely viral. 2. Probable alcohol withdrawal seizures. 3. Wernicke's encephalopathy. 4. Hypertension.     Plan: 1. Add folic acid to her thiamine. 2. Start lisinopril for her high blood pressure. 3. Patient can move to telemetry floor.     LOS: 2 days   Wilson Singer Pager [phone]  05/28/2012, 7:32 AM

## 2012-05-28 NOTE — Progress Notes (Signed)
Subjective: Interval History:   Objective: Vital signs in last 24 hours: Temp:  [97.8 F (36.6 C)-98.1 F (36.7 C)] 98 F (36.7 C) (09/17 0733) Pulse Rate:  [88-128] 97  (09/17 0900) Resp:  [15-60] 18  (09/17 0900) BP: (143-183)/(76-117) 182/106 mmHg (09/17 0900) SpO2:  [90 %-100 %] 98 % (09/17 0900) Weight:  [63.3 kg (139 lb 8.8 oz)-63.7 kg (140 lb 6.9 oz)] 63.7 kg (140 lb 6.9 oz) (09/17 0421)  Intake/Output from previous day: 09/16 0701 - 09/17 0700 In: 1124 [P.O.:120; I.V.:750; IV Piggyback:254] Out: 1050 [Urine:1050] Intake/Output this shift:   Nutritional status:     Lab Results:  Basename 05/28/12 0416 05/26/12 2108  WBC 12.3* 5.7  HGB 17.2* 15.7*  HCT 51.8* 47.8*  PLT 180 173  NA 138 141  K 2.9* 3.4*  CL 96 101  CO2 29 28  GLUCOSE 127* 151*  BUN 8 6  CREATININE 0.53 0.59  CALCIUM 9.8 9.8  LABA1C -- --   Lipid Panel No results found for this basename: CHOL,TRIG,HDL,CHOLHDL,VLDL,LDLCALC in the last 72 hours  Studies/Results: Mr Brain Wo Contrast  05/27/2012  *RADIOLOGY REPORT*  Clinical Data: 56 year old female with new onset seizure, altered mental status. Blood pressure of 187/103 upon presentation to the ER.  MRI HEAD WITHOUT CONTRAST  Technique:  Multiplanar, multiecho pulse sequences of the brain and surrounding structures were obtained according to standard protocol without intravenous contrast.  Comparison: Head CTs without contrast [facility] 11/27/2010 (no  images, report only) and 02/13/2010 (images present).  Findings: Study is intermittently degraded by motion artifact despite repeated imaging attempts.  No definite restricted diffusion to suggest acute infarction.  No ventriculomegaly. No midline shift, mass effect, or evidence of mass lesion.  No definite acute intracranial hemorrhage.  Negative pituitary, cervicomedullary junction and visualized cervical spine.  Major intracranial vascular flow voids are grossly preserved.  There is  patchy FLAIR and possibly also at T2 hyperintensity in the cortex and subcortical white matter of the superior frontal gyri and parietal lobes in a somewhat symmetric distribution.  There is also evidence of left posterior temporal and occipital lobe involvement.  There is also evidence of abnormal FLAIR and T2 signal in the medial thalami (series 8 image 14).  Other deep gray matter nuclei appear within normal limits.  Brainstem and cerebellum appear grossly normal.  Normal marrow signal.  Grossly negative orbits, paranasal sinuses and mastoids.  IMPRESSION: 1.  Abnormal gray and white matter signal in a patchy quasi symmetric pattern over the superior and posterior convexities, and involving the medial thalami.  Favor posterior reversible encephalopathy syndrome. Less likely considerations include ADEM, and encephalitis due to viral or atypical etiology. 2.  Motion degraded exam despite repeated imaging attempts.   Original Report Authenticated By: Harley Hallmark, M.D.    Dg Chest Portable 1 View  05/26/2012  *RADIOLOGY REPORT*  Clinical Data: Chest pain with nausea and vomiting  PORTABLE CHEST - 1 VIEW  Comparison: 04/24/2012  Findings: Cardiomegaly. COPD.  Clear lung fields.  Osteopenia. Improved aeration from priors.  IMPRESSION: Cardiomegaly, no active disease.   Original Report Authenticated By: Elsie Stain, M.D.     Medications:  Scheduled Meds:   . acetaminophen      . aspirin EC  81 mg Oral Daily  . budesonide-formoterol  2 puff Inhalation BID  . diazepam  5 mg Oral QID  . folic acid  1 mg Oral Daily  . lisinopril  20 mg Oral Daily  .  LORazepam      . LORazepam  1 mg Intravenous Once  . pantoprazole  40 mg Oral Daily  . PARoxetine  40 mg Oral Daily  . phenytoin (DILANTIN) IV  1,000 mg Intravenous NOW  . phenytoin (DILANTIN) IV  100 mg Intravenous Q8H  . potassium chloride  40 mEq Oral BID  . sodium chloride  3 mL Intravenous Q12H  . sodium chloride      . sodium chloride      .  thiamine  100 mg Intravenous Daily  . thiamine  100 mg Intravenous Once  . tiotropium  18 mcg Inhalation Daily  . DISCONTD: phenytoin  100 mg Oral TID  . DISCONTD: thiamine  100 mg Intravenous Once  . DISCONTD: thiamine  100 mg Oral Daily   Continuous Infusions:   . 0.9 % NaCl with KCl 20 mEq / L 75 mL/hr at 05/27/12 1700   PRN Meds:.LORazepam, morphine injection, ondansetron (ZOFRAN) IV, ondansetron   Assessment/Plan: Also see dictation.   LOS: 2 days   Donna [name]

## 2012-05-28 NOTE — Progress Notes (Signed)
Pt transferred to room 314. Report given to Loletta Specter RN. Pt hypertensive, lisinopril started today.

## 2012-05-29 LAB — COMPREHENSIVE METABOLIC PANEL
ALT: 26 U/L (ref 0–35)
Alkaline Phosphatase: 126 U/L — ABNORMAL HIGH (ref 39–117)
BUN: 10 mg/dL (ref 6–23)
Chloride: 98 mEq/L (ref 96–112)
GFR calc Af Amer: >90 mL/min (ref >90)
Glucose, Bld: 118 mg/dL — ABNORMAL HIGH (ref 70–99)
Potassium: 3.4 mEq/L — ABNORMAL LOW (ref 3.5–5.1)
Sodium: 139 mEq/L (ref 135–145)
Total Bilirubin: 0.7 mg/dL (ref 0.3–1.2)

## 2012-05-29 LAB — TSH: TSH: 0.838 u[IU]/mL (ref 0.350–4.500)

## 2012-05-29 LAB — CBC
HCT: 50.5 % — ABNORMAL HIGH (ref 36.0–46.0)
Hemoglobin: 16.9 g/dL — ABNORMAL HIGH (ref 12.0–15.0)
RBC: 5.37 MIL/uL — ABNORMAL HIGH (ref 3.87–5.11)
WBC: 10.2 10*3/uL (ref 4.0–10.5)

## 2012-05-29 LAB — HOMOCYSTEINE: Homocysteine: 8.2 umol/L (ref 4.0–15.4)

## 2012-05-29 MED ORDER — CLONIDINE HCL 0.1 MG PO TABS
0.1000 mg | ORAL_TABLET | Freq: Three times a day (TID) | ORAL | Status: DC
  Administered 2012-05-29 – 2012-05-30 (×2): 0.1 mg via ORAL
  Filled 2012-05-29 (×3): qty 1

## 2012-05-29 MED ORDER — SODIUM CHLORIDE 0.9 % IJ SOLN
INTRAMUSCULAR | Status: AC
  Administered 2012-05-29: 3 mL
  Filled 2012-05-29: qty 3

## 2012-05-29 NOTE — Progress Notes (Signed)
[facility] PROGRESS NOTE  Donna [name] ZOX:[id number] DOB: 1955-10-11 DOA: 05/26/2012 PCP: Louie Boston, MD  Assessment/Plan: Principal Problem:  *Nausea & vomiting Active Problems:  HEPATITIS C  GASTROESOPHAGEAL REFLUX DISEASE  Chest pain  Seizure  Wernicke encephalopathy  1. Gastroenteritis, likely viral, resolved 2. Seizures, possibly related to alcohol withdrawal versus benzodiazepine withdrawal. When speaking to the patient's son today, he indicated that the patient obtains Xanax illegaly. He reports she often uses more than 240 pills a month. It is only when she runs out of this medication as when she starts drinking heavily. He reports this is a chronic ongoing problems in that she has had multiple admissions for the same. She's been started on Dilantin per neurology. She is also on diazepam which is likely helping with any benzodiazepine withdrawal. We will do further management to neurology. 3. Where he encephalopathy. Patient continues with hallucinations and confusion. Per neurology this is likely a Wernicke encephalopathy. She is is on thiamine and folic acid.  Code Status:  Family Communication: Discussed with patient at the bedside as well as her son over the phone Disposition Plan: Depending on physical therapy evaluation. May also need to assess patient's capacity to make decisions regarding her discharge disposition.    Brief narrative: The study was initially admitted to the hospital with gastroenteritis but subsequently developed status epilepticus. She was transferred to the step down unit and was started on Dilantin. She stabilized and then moved to a regular bed  Consultants:  Neurology  Procedures:  None  Antibiotics:  None  HPI/Subjective: Feeling a little bit better today. Still had hallucinations last night. Denies any other complaints  Objective: Filed Vitals:   05/29/12 0701 05/29/12 1014 05/29/12 1159 05/29/12 1539  BP: 182/124  158/90  92/63  Pulse: 107   85  Temp: 98.3 F (36.8 C)   98.3 F (36.8 C)  TempSrc: Oral     Resp: 18   18  Height:      Weight:      SpO2: 90%  90% 93%    Intake/Output Summary (Last 24 hours) at 05/29/12 1843 Last data filed at 05/29/12 1800  Gross per 24 hour  Intake    249 ml  Output    350 ml  Net   -101 ml   Filed Weights   05/27/12 0509 05/27/12 1200 05/28/12 0421  Weight: 65.59 kg (144 lb 9.6 oz) 63.3 kg (139 lb 8.8 oz) 63.7 kg (140 lb 6.9 oz)    Exam:   General:  No acute distress  Cardiovascular: S1, S2, regular rate and rhythm  Respiratory: Clear auscultation bilaterally  Abdomen: Soft, nontender, nondistended, bowel sounds are active  Data Reviewed: Basic Metabolic Panel:  Lab 05/29/12 8119 05/28/12 0416 05/26/12 2108  NA 139 138 141  K 3.4* 2.9* 3.4*  CL 98 96 101  CO2 27 29 28   GLUCOSE 118* 127* 151*  BUN 10 8 6   CREATININE 0.48* 0.53 0.59  CALCIUM 10.0 9.8 9.8  MG -- -- --  PHOS -- -- --   Liver Function Tests:  Lab 05/29/12 0518 05/26/12 2108  AST 32 32  ALT 26 38*  ALKPHOS 126* 132*  BILITOT 0.7 0.4  PROT 7.1 6.9  ALBUMIN 4.3 4.4    Lab 05/26/12 2254  LIPASE 15  AMYLASE --   No results found for this basename: AMMONIA:5 in the last 168 hours CBC:  Lab 05/29/12 0518 05/28/12 0416 05/26/12 2108  WBC 10.2 12.3* 5.7  NEUTROABS -- --  4.2  HGB 16.9* 17.2* 15.7*  HCT 50.5* 51.8* 47.8*  MCV 94.0 95.4 96.6  PLT 152 180 173   Cardiac Enzymes:  Lab 05/26/12 2254 05/26/12 2108  CKTOTAL -- --  CKMB -- --  CKMBINDEX -- --  TROPONINI <0.30 <0.30   BNP (last 3 results)  Basename 04/23/12 0327 04/18/12 1924  PROBNP 19352.0* 16764.0*   CBG: No results found for this basename: GLUCAP:5 in the last 168 hours  Recent Results (from the past 240 hour(s))  MRSA PCR SCREENING     Status: Normal   Collection Time   05/27/12 12:00 PM      Component Value Range Status Comment   MRSA by PCR NEGATIVE  NEGATIVE Final       Studies: No results found.  Scheduled Meds:   . aspirin EC  81 mg Oral Daily  . budesonide-formoterol  2 puff Inhalation BID  . cloNIDine  0.1 mg Oral TID  . diazepam  5 mg Oral QID  . folic acid  1 mg Oral Daily  . lisinopril  20 mg Oral Daily  . pantoprazole  40 mg Oral Daily  . PARoxetine  40 mg Oral Daily  . phenytoin (DILANTIN) IV  100 mg Intravenous Q8H  . potassium chloride  40 mEq Oral BID  . sodium chloride  3 mL Intravenous Q12H  . sodium chloride      . thiamine  100 mg Intravenous Daily  . tiotropium  18 mcg Inhalation Daily   Continuous Infusions:   Principal Problem:  *Nausea & vomiting Active Problems:  HEPATITIS C  GASTROESOPHAGEAL REFLUX DISEASE  Chest pain  Seizure  Wernicke encephalopathy    Time spent: 30 mins    Clarnce Homan  [facility] Pager [phone]. If 7PM-7AM, please contact night-coverage at [email or web address], password [facility] 05/29/2012, 6:43 PM  LOS: 3 days

## 2012-05-29 NOTE — Progress Notes (Signed)
UR Chart Review Completed  

## 2012-05-29 NOTE — Progress Notes (Signed)
Pain and redness noted around old IV site in right right going up right arm. Also pain around and redness around IV in right f/a. Removed IV. Placed ice pack to arms. New IV in left Forearm looks good. Gave dilantin IV push over 4 minutes diluted in 10 ML NS drawn up with a filter needle. Possible infiltration of dilantin at other sites??

## 2012-05-29 NOTE — Progress Notes (Signed)
[facility] [place] [name], MD     [email or web address]        SUBJECTIVE: The patient still continues to complain of significant back pain. She is somewhat compulsive about this. She does not endorse having significant headaches at this time but she reports that she has had this recently.  OBJECTIVE: She is awake and the seemed much more lucid. She does not have visual hallucinations during the evaluation as she has had previously. She still confabulates some today and has some receptive aphasia. This has improved however.pupils are reactive. Extraocular movements are full without nystagmus. She has good strength in both extremities.  ASSESSMENT/PLAN:  1. Warnick's encephalopathy. This is improving gradually.continue with thiamine, folic acid and supportive care. 2. Poorly controlled hypertension. Clonidine will be added at least for now. 3. Seizures. These are likely alcohol withdrawal seizures given her previous history of alcoholism and alcohol withdrawal seizures. For now will continue with seizure medications however.  Beryle Beams, MD Diplomate, American Board of Psychiatry and Neurology (Neurology).

## 2012-05-29 NOTE — Progress Notes (Signed)
OT Cancellation Note  Occupational Therapy evaluation cancelled today due to patient sleeping at this time. Will reattempt OT eval tomorrow.Limmie Patricia, OTR/L 05/29/2012, 3:14 PM

## 2012-05-29 NOTE — Progress Notes (Signed)
MEDICATION RELATED CONSULT NOTE   Pharmacy Consult for Dilantin Indication: new onset seizures (most likely related to ETOH withdrawal)  Allergies  Allergen Reactions  . Corn-Containing Products Shortness Of Breath and Swelling    REACTION: affects breathing  . Eggs Or Egg-Derived Products Shortness Of Breath  . Latex Shortness Of Breath, Itching and Rash  . Codeine Itching and Nausea And Vomiting  . Penicillins Other (See Comments)    REACTION: "thrush"  . Sulfa Antibiotics Other (See Comments)    REACTION: Unknown   Patient Measurements: Height: 5\' 3"  (160 cm) Weight: 140 lb 6.9 oz (63.7 kg) IBW/kg (Calculated) : 52.4   Vital Signs: Temp: 98.3 F (36.8 C) (09/18 0701) Temp src: Oral (09/18 0701) BP: 182/124 mmHg (09/18 0701) Pulse Rate: 107  (09/18 0701) Intake/Output from previous day: 09/17 0701 - 09/18 0700 In: 6 [IV Piggyback:6] Out: -  Intake/Output from this shift:    Labs:  [facility] 05/29/12 0518 05/28/12 0416 05/26/12 2108  WBC 10.2 12.3* 5.7  HGB 16.9* 17.2* 15.7*  HCT 50.5* 51.8* 47.8*  PLT 152 180 173  APTT -- -- --  CREATININE 0.48* 0.53 0.59  LABCREA -- -- --  CREATININE 0.48* 0.53 0.59  CREAT24HRUR -- -- --  MG -- -- --  PHOS -- -- --  ALBUMIN 4.3 -- 4.4  PROT 7.1 -- 6.9  ALBUMIN 4.3 -- 4.4  AST 32 -- 32  ALT 26 -- 38*  ALKPHOS 126* -- 132*  BILITOT 0.7 -- 0.4  BILIDIR -- -- --  IBILI -- -- --   Estimated Creatinine Clearance: 69.7 ml/min (by C-G formula based on Cr of 0.48).  Medical History: Past Medical History  Diagnosis Date  . Coronary artery disease   . Degenerative disc disease   . Asthma   . Lupus   . Hepatitis C   . Heart attack    Medications:  Scheduled:     . aspirin EC  81 mg Oral Daily  . budesonide-formoterol  2 puff Inhalation BID  . cloNIDine  0.1 mg Oral TID  . diazepam  5 mg Oral QID  . folic acid  1 mg Oral Daily  . lisinopril  20 mg Oral Daily  . pantoprazole  40 mg Oral Daily  . PARoxetine  40 mg  Oral Daily  . phenytoin (DILANTIN) IV  100 mg Intravenous Q8H  . potassium chloride  40 mEq Oral BID  . sodium chloride  3 mL Intravenous Q12H  . sodium chloride      . sodium chloride      . thiamine  100 mg Intravenous Daily  . tiotropium  18 mcg Inhalation Daily   Assessment: 56yo female with new onset seizures during this admission. This lady was initially admitted for diagnosis of gastroenteritis, likely viral. Pt was observed by staff during the seizure. The seizure stopped spontaneously. Arterial blood gas and MRI brain scan is pending. I do not see any record in her history of epilepsy or a seizure disorder and certainly she is not on any anticonvulsant medications. I did note that her blood pressure was elevated.  Nurse reports more seizures since 1st one was observed. Neurology note/assessment reviewed.   Goal of Therapy:  Seizure control and corrected phenytoin level 10-20   Plan:  Continue Dilanti 100mg  iv q8hrs Check trough level at steady state When therapeutic dose established, change to once nightly at bedtime. F/U recommendations from neurology  Valrie Hart A 05/29/2012,10:12 AM

## 2012-05-29 NOTE — Progress Notes (Signed)
Foley removed, patient has already voided independently

## 2012-05-29 NOTE — Progress Notes (Signed)
Subjective: Interval History:   Objective: Vital signs in last 24 hours: Temp:  [98.3 F (36.8 C)-98.4 F (36.9 C)] 98.3 F (36.8 C) (09/18 0701) Pulse Rate:  [97-107] 107  (09/18 0701) Resp:  [16-18] 18  (09/18 0701) BP: (180-182)/(100-124) 182/124 mmHg (09/18 0701) SpO2:  [90 %-98 %] 90 % (09/18 0701)  Intake/Output from previous day: 09/17 0701 - 09/18 0700 In: 6 [IV Piggyback:6] Out: -  Intake/Output this shift:   Nutritional status: Dysphagia   Lab Results:  Basename 05/29/12 0518 05/28/12 0416  WBC 10.2 12.3*  HGB 16.9* 17.2*  HCT 50.5* 51.8*  PLT 152 180  NA 139 138  K 3.4* 2.9*  CL 98 96  CO2 27 29  GLUCOSE 118* 127*  BUN 10 8  CREATININE 0.48* 0.53  CALCIUM 10.0 9.8  LABA1C -- --   Lipid Panel No results found for this basename: CHOL,TRIG,HDL,CHOLHDL,VLDL,LDLCALC in the last 72 hours  Studies/Results: Mr Brain Wo Contrast  05/27/2012  *RADIOLOGY REPORT*  Clinical Data: 56 year old female with new onset seizure, altered mental status. Blood pressure of 187/103 upon presentation to the ER.  MRI HEAD WITHOUT CONTRAST  Technique:  Multiplanar, multiecho pulse sequences of the brain and surrounding structures were obtained according to standard protocol without intravenous contrast.  Comparison: Head CTs without contrast [facility] 11/27/2010 (no  images, report only) and 02/13/2010 (images present).  Findings: Study is intermittently degraded by motion artifact despite repeated imaging attempts.  No definite restricted diffusion to suggest acute infarction.  No ventriculomegaly. No midline shift, mass effect, or evidence of mass lesion.  No definite acute intracranial hemorrhage.  Negative pituitary, cervicomedullary junction and visualized cervical spine.  Major intracranial vascular flow voids are grossly preserved.  There is patchy FLAIR and possibly also at T2 hyperintensity in the cortex and subcortical white matter of the superior frontal gyri  and parietal lobes in a somewhat symmetric distribution.  There is also evidence of left posterior temporal and occipital lobe involvement.  There is also evidence of abnormal FLAIR and T2 signal in the medial thalami (series 8 image 14).  Other deep gray matter nuclei appear within normal limits.  Brainstem and cerebellum appear grossly normal.  Normal marrow signal.  Grossly negative orbits, paranasal sinuses and mastoids.  IMPRESSION: 1.  Abnormal gray and white matter signal in a patchy quasi symmetric pattern over the superior and posterior convexities, and involving the medial thalami.  Favor posterior reversible encephalopathy syndrome. Less likely considerations include ADEM, and encephalitis due to viral or atypical etiology. 2.  Motion degraded exam despite repeated imaging attempts.   Original Report Authenticated By: Harley Hallmark, M.D.     Medications:  Scheduled Meds:    . aspirin EC  81 mg Oral Daily  . budesonide-formoterol  2 puff Inhalation BID  . cloNIDine  0.1 mg Oral TID  . diazepam  5 mg Oral QID  . folic acid  1 mg Oral Daily  . lisinopril  20 mg Oral Daily  . pantoprazole  40 mg Oral Daily  . PARoxetine  40 mg Oral Daily  . phenytoin (DILANTIN) IV  100 mg Intravenous Q8H  . potassium chloride  40 mEq Oral BID  . sodium chloride  3 mL Intravenous Q12H  . sodium chloride      . thiamine  100 mg Intravenous Daily  . tiotropium  18 mcg Inhalation Daily   Continuous Infusions:  PRN Meds:.LORazepam, morphine injection, ondansetron (ZOFRAN) IV, ondansetron   Assessment/Plan: Also  see dictation.   LOS: 3 days   Donna [name]

## 2012-05-30 ENCOUNTER — Inpatient Hospital Stay (HOSPITAL_COMMUNITY): Payer: Medicare Other

## 2012-05-30 ENCOUNTER — Encounter (HOSPITAL_COMMUNITY): Payer: Medicare Other

## 2012-05-30 LAB — RPR: RPR Ser Ql: NONREACTIVE

## 2012-05-30 MED ORDER — SODIUM CHLORIDE 0.9 % IJ SOLN
10.0000 mL | INTRAMUSCULAR | Status: DC | PRN
  Administered 2012-05-30 – 2012-06-03 (×3): 10 mL
  Filled 2012-05-30 (×4): qty 3

## 2012-05-30 MED ORDER — SODIUM CHLORIDE 0.9 % IJ SOLN
10.0000 mL | Freq: Two times a day (BID) | INTRAMUSCULAR | Status: DC
  Administered 2012-05-30 (×2): 10 mL
  Administered 2012-05-31 (×2): 20 mL
  Administered 2012-06-01 – 2012-06-04 (×4): 10 mL
  Filled 2012-05-30 (×2): qty 6
  Filled 2012-05-30 (×4): qty 3

## 2012-05-30 MED ORDER — CLONIDINE HCL 0.1 MG PO TABS
0.1000 mg | ORAL_TABLET | Freq: Three times a day (TID) | ORAL | Status: DC | PRN
  Administered 2012-05-31 – 2012-06-01 (×2): 0.1 mg via ORAL
  Filled 2012-05-30 (×2): qty 1

## 2012-05-30 MED ORDER — SODIUM CHLORIDE 0.9 % IJ SOLN
INTRAMUSCULAR | Status: AC
  Filled 2012-05-30: qty 3

## 2012-05-30 MED ORDER — METOPROLOL TARTRATE 25 MG PO TABS
25.0000 mg | ORAL_TABLET | Freq: Two times a day (BID) | ORAL | Status: DC
  Administered 2012-05-30 – 2012-05-31 (×2): 25 mg via ORAL
  Filled 2012-05-30 (×2): qty 1

## 2012-05-30 MED ORDER — TRAZODONE HCL 50 MG PO TABS
50.0000 mg | ORAL_TABLET | Freq: Every evening | ORAL | Status: DC | PRN
  Filled 2012-05-30: qty 1

## 2012-05-30 MED ORDER — ACETAMINOPHEN 325 MG PO TABS
650.0000 mg | ORAL_TABLET | ORAL | Status: DC | PRN
  Administered 2012-05-30 – 2012-06-04 (×14): 650 mg via ORAL
  Filled 2012-05-30 (×14): qty 2

## 2012-05-30 NOTE — Progress Notes (Addendum)
Physical Therapy Treatment Patient Details Name: Donna [name] MRN: [id number] DOB: 08-12-1956 Today's Date: 05/30/2012 Time: 1000-1015 PT Time Calculation (min): 15 min  PT Assessment / Plan / Recommendation    Comments on Treatment Session   When asked to begin therapy session pt stands up abruptly and begins to walk without AD and without any regard to the IV in her arm. Pt requires vc's to stop and get AD before beginning gait training. During session pt requests to return to room secondary to chest pain. When asked what type of chest pain she was having she states 10/10. Pt does not display signs of respiratory or cardiac distress. O2 was measured at 95% on room air at end of session. RN notified of pt's chest pain.    Subjective: Pt states that she is a little nauseated but willing to participate in therapy session.                Plan  (Continue to progress per PT POC.)    Precautions / Restrictions Restrictions Weight Bearing Restrictions: No       Mobility  Transfers Transfers: Sit to Stand;Stand to Sit Sit to Stand: 6: Modified independent (Device/Increase time);From bed Stand to Sit: 6: Modified independent (Device/Increase time);To bed Ambulation/Gait Ambulation/Gait Assistance: 5: Supervision Ambulation Distance (Feet): 160 Feet Assistive device: Rolling walker Ambulation/Gait Assistance Details: Sissoring appears to have decreased minimally. Gait Pattern: Scissoring;Narrow base of support Gait velocity: WNL General Gait Details: Gait is stable with walker.    Exercises General Exercises - Lower Extremity Heel Raises: Strengthening;Both;10 reps;Standing    Visit Information  Last PT Received On: 05/30/12    Subjective Data  Subjective: Pt states that she is a little nauseated but willing to participate in therapy session.   Cognition       Balance  Static Standing Balance Static Standing - Balance Support: No upper extremity supported Static  Standing - Level of Assistance: 5: Stand by assistance High Level Balance High Level Balance Activites: Side stepping (Tandem, Toe walking) High Level Balance Comments: Pt presents with LOB x 3 requiring min to mod assist to recover. Balance activities performed without UE assistance.  End of Session PT - End of Session Equipment Utilized During Treatment: Gait belt Activity Tolerance: Patient tolerated treatment well;Treatment limited secondary to medical complications (Comment) Patient left: in bed;with call bell/phone within reach;with bed alarm set   Seth Bake, PTA 05/30/2012, 11:35 AM

## 2012-05-30 NOTE — Progress Notes (Signed)
  [facility] Donna [name] A. Donna Pilgrim, MD     [email or web address]                SUBJECTIVE: The patient still continues to complain of significant back pain. The patient is clearly more lucid today. She still complains of global pain especially involving the back. She also complains of neck pain. No seizure reported. I did review the hospital his notes and it appears she has been taking large doses of alprazolam 240 tablets a month. She gets a large amount of this from other people's prescription. When she runs out she drinks heavily. He also reports that she has been admitted almost a monthly basis at [facility] for withdrawals and seizures.  OBJECTIVE: She is awake and she continues to improve. Her speech and thoughts are a lot more lucid and coherent today. The patient briskly responded that she is in the hospital at an independent and she knows that his 2013. She has no confabulations today. There is also no visual hallucinations noted today she is less tremulous. Extraocular movements are intact. She has good strength bilaterally.  ASSESSMENT/PLAN:  1. Donna [name]'s encephalopathy. This is improving gradually.continue with thiamine, folic acid and supportive care. 2. Hypertension and the tachycardia- better on Clonidine. 3. Seizures. These are likely alcohol withdrawal seizures given her previous history of alcoholism and alcohol withdrawal seizures. We will discontinue the Dilantin as the medication is unlikely to prevent alcohol withdrawal seizures or benzodiazepine withdrawal seizures.  Beryle Beams, MD Diplomate, American Board of Psychiatry and Neurology (Neurology).

## 2012-05-30 NOTE — Progress Notes (Signed)
Subjective: Interval History:   Objective: Vital signs in last 24 hours: Temp:  [97.4 F (36.3 C)-98.3 F (36.8 C)] 97.4 F (36.3 C) (09/19 0530) Pulse Rate:  [68-85] 68  (09/19 0530) Resp:  [16-18] 16  (09/19 0530) BP: (88-158)/(60-90) 92/64 mmHg (09/19 0530) SpO2:  [90 %-96 %] 92 % (09/19 0530)  Intake/Output from previous day: 09/18 0701 - 09/19 0700 In: 243 [P.O.:240; I.V.:3] Out: 350 [Urine:350] Intake/Output this shift:   Nutritional status: Dysphagia   Lab Results:  Basename 05/29/12 0518 05/28/12 0416  WBC 10.2 12.3*  HGB 16.9* 17.2*  HCT 50.5* 51.8*  PLT 152 180  NA 139 138  K 3.4* 2.9*  CL 98 96  CO2 27 29  GLUCOSE 118* 127*  BUN 10 8  CREATININE 0.48* 0.53  CALCIUM 10.0 9.8  LABA1C -- --   Lipid Panel No results found for this basename: CHOL,TRIG,HDL,CHOLHDL,VLDL,LDLCALC in the last 72 hours  Studies/Results: No results found.  Medications:  Scheduled Meds:    . aspirin EC  81 mg Oral Daily  . budesonide-formoterol  2 puff Inhalation BID  . cloNIDine  0.1 mg Oral TID  . diazepam  5 mg Oral QID  . folic acid  1 mg Oral Daily  . lisinopril  20 mg Oral Daily  . pantoprazole  40 mg Oral Daily  . PARoxetine  40 mg Oral Daily  . phenytoin (DILANTIN) IV  100 mg Intravenous Q8H  . sodium chloride  3 mL Intravenous Q12H  . sodium chloride      . thiamine  100 mg Intravenous Daily  . tiotropium  18 mcg Inhalation Daily   Continuous Infusions:  PRN Meds:.LORazepam, morphine injection, ondansetron (ZOFRAN) IV, ondansetron   Assessment/Plan: Wernick's SZ HTN. Better on clonidine  Also see dictation.   LOS: 4 days   Donna [name]

## 2012-05-30 NOTE — Progress Notes (Signed)
[facility] PROGRESS NOTE  Donna [name] ZOX:[id number] DOB: 1956/04/16 DOA: 05/26/2012 PCP: Louie Boston, MD  Assessment/Plan: Principal Problem:  *Nausea & vomiting Active Problems:  HEPATITIS C  GASTROESOPHAGEAL REFLUX DISEASE  Chest pain  Seizure  Wernicke encephalopathy Oxygen dependent COPD Hypertension  1. Gastroenteritis, likely viral, Patient reports recurrent nausea, vomiting, abd pain and diarrhea today.  She is unable to keep anything down.  Will provide supportive therapy and observe. 2. Seizures, possibly related to alcohol withdrawal versus benzodiazepine withdrawal. When speaking to the patient's son, he indicated that the patient obtains Xanax illegaly. He reports she often uses more than 240 pills a month. It is only when she runs out of this medication, is when she starts drinking heavily. He reports this is a chronic ongoing problem and that she has had multiple admissions for the same. He reports that she is admitted monthly with withdrawal seizures to [facility], even needing intubation at one time.  When she is discharged, she returns to using xanax and alcohol until she is in the same situation. She was briefly placed on dilantin per neurology, but that has since been discontinued. She is also on diazepam which is likely helping with any benzodiazepine withdrawal. We will defer further management to neurology. 3. Wernicke encephalopathy. Per neurology this is likely a Wernicke encephalopathy. She is is on thiamine and folic acid. She reports that the hallucinations have resolved, but on my exam today, she still appears to be confabulating.  She has difficulty answering simple questions. Her son is understandably concerned about her discharge disposition.  We will request a psychiatry consultation to determine capacity to make decisions regarding her discharge plan. 4. Hypertension.  It appears that her blood pressure continues to fluctuate.  Will change  clonidine to PRN basis, and use scheduled lopressor for now.  She is also on lisinopril  Code Status: full code Family Communication: Discussed with patient at the bedside as well as her son over the phone Disposition Plan: Depending on psychiatry assessment of capacity    Brief narrative: The study was initially admitted to the hospital with gastroenteritis but subsequently developed status epilepticus. She was transferred to the step down unit and was started on Dilantin. She stabilized and then moved to a regular bed  Consultants:  Neurology  Psychiatry  Procedures:  None  Antibiotics:  None  HPI/Subjective: Complains of persistent vomiting, nausea and abdominal pain. Also had 2 loose stools today. No shortness of breath or cough.  Objective: Filed Vitals:   05/29/12 2107 05/30/12 0530 05/30/12 1159 05/30/12 1415  BP: 88/60 92/64 175/106 177/105  Pulse: 80 68 84 86  Temp: 98.1 F (36.7 C) 97.4 F (36.3 C)  99 F (37.2 C)  TempSrc:      Resp: 16 16 16 18   Height:      Weight:      SpO2: 96% 92% 92% 98%    Intake/Output Summary (Last 24 hours) at 05/30/12 1419 Last data filed at 05/30/12 0800  Gross per 24 hour  Intake    600 ml  Output    350 ml  Net    250 ml   Filed Weights   05/27/12 0509 05/27/12 1200 05/28/12 0421  Weight: 65.59 kg (144 lb 9.6 oz) 63.3 kg (139 lb 8.8 oz) 63.7 kg (140 lb 6.9 oz)    Exam:   General:  No acute distress  Cardiovascular: S1, S2, regular rate and rhythm  Respiratory: Clear auscultation bilaterally  Abdomen: Soft, nontender,  nondistended, bowel sounds are active  Data Reviewed: Basic Metabolic Panel:  Lab 05/29/12 2841 05/28/12 0416 05/26/12 2108  NA 139 138 141  K 3.4* 2.9* 3.4*  CL 98 96 101  CO2 27 29 28   GLUCOSE 118* 127* 151*  BUN 10 8 6   CREATININE 0.48* 0.53 0.59  CALCIUM 10.0 9.8 9.8  MG -- -- --  PHOS -- -- --   Liver Function Tests:  Lab 05/29/12 0518 05/26/12 2108  AST 32 32  ALT 26 38*    ALKPHOS 126* 132*  BILITOT 0.7 0.4  PROT 7.1 6.9  ALBUMIN 4.3 4.4    Lab 05/26/12 2254  LIPASE 15  AMYLASE --   No results found for this basename: AMMONIA:5 in the last 168 hours CBC:  Lab 05/29/12 0518 05/28/12 0416 05/26/12 2108  WBC 10.2 12.3* 5.7  NEUTROABS -- -- 4.2  HGB 16.9* 17.2* 15.7*  HCT 50.5* 51.8* 47.8*  MCV 94.0 95.4 96.6  PLT 152 180 173   Cardiac Enzymes:  Lab 05/26/12 2254 05/26/12 2108  CKTOTAL -- --  CKMB -- --  CKMBINDEX -- --  TROPONINI <0.30 <0.30   BNP (last 3 results)  Basename 04/23/12 0327 04/18/12 1924  PROBNP 19352.0* 16764.0*   CBG: No results found for this basename: GLUCAP:5 in the last 168 hours  Recent Results (from the past 240 hour(s))  MRSA PCR SCREENING     Status: Normal   Collection Time   05/27/12 12:00 PM      Component Value Range Status Comment   MRSA by PCR NEGATIVE  NEGATIVE Final      Studies: Dg Chest Port 1 View  05/30/2012  *RADIOLOGY REPORT*  Clinical Data: PICC line placement  PORTABLE CHEST - 1 VIEW  Comparison: 05/26/2012  Findings: Right arm PICC terminates in the lower SVC, just above the cavoatrial junction.  Lungs are essentially clear. No pleural effusion or pneumothorax.  Cardiomediastinal silhouette is within normal limits.  IMPRESSION: Right arm PICC terminates in the lower SVC.   Original Report Authenticated By: Charline Bills, M.D.     Scheduled Meds:    . aspirin EC  81 mg Oral Daily  . budesonide-formoterol  2 puff Inhalation BID  . cloNIDine  0.1 mg Oral TID  . diazepam  5 mg Oral QID  . folic acid  1 mg Oral Daily  . lisinopril  20 mg Oral Daily  . pantoprazole  40 mg Oral Daily  . PARoxetine  40 mg Oral Daily  . sodium chloride  10-40 mL Intracatheter Q12H  . sodium chloride  3 mL Intravenous Q12H  . thiamine  100 mg Intravenous Daily  . tiotropium  18 mcg Inhalation Daily  . DISCONTD: phenytoin (DILANTIN) IV  100 mg Intravenous Q8H   Continuous Infusions:   Principal  Problem:  *Nausea & vomiting Active Problems:  HEPATITIS C  GASTROESOPHAGEAL REFLUX DISEASE  Chest pain  Seizure  Wernicke encephalopathy    Time spent: 30 mins    MEMON,JEHANZEB  [facility] Pager [phone]. If 7PM-7AM, please contact night-coverage at [email or web address], password [facility] 05/30/2012, 2:19 PM  LOS: 4 days

## 2012-05-30 NOTE — Clinical Social Work Note (Signed)
CSW spoke with MD regarding pt and was advised to see pt following telepsych consult. CSW will follow up in AM.  Derenda Fennel, LCSW [phone]

## 2012-05-30 NOTE — Progress Notes (Signed)
Occupational Therapy Screen  OT orders received. Patient chart reviewed. Upon arrival, patient was walking out of bathroom with bed alarm going off. Patient's friend was in the room. Patient stated that she needed to use the bathroom right away and couldn't wait. Patient states that she feels she does not require OT services at this time and she is functioning at prior level of function. Patient stated this as she performed upper and lower dressing independently. Patient does not require OT services at this time; will sign off.   Limmie Patricia, OTR/L 05/30/12 2:51PM

## 2012-05-30 NOTE — Progress Notes (Signed)
This pt was seen yesterday for an evaluation.  Unfortunately, I neglected to have the eval entered into the progress notes.  To recap, Pt is very close to her prior functional level.  She normally ambulates with a rolling walker due to poor dynamic balance (she has a scissoring gait pattern), and with a walker she is very stable.  She had been receiving HHPT up until recently when they d/c'd tx.  We will continue to see her while in house to work on gait/balance and it would be prudent to resume HHPT at d/c.  Otherwise, she should definitely be able to transition to home at d/c.

## 2012-05-30 NOTE — Progress Notes (Signed)
Paper faxed for tele psych, nurse told that MD would call when ready to do eval.  This was done at approx 3 pm this afternoon, no calls from telepsych have been received and no answer when called number provided.  Secretary is faxing papers over again.

## 2012-05-31 DIAGNOSIS — J441 Chronic obstructive pulmonary disease with (acute) exacerbation: Secondary | ICD-10-CM

## 2012-05-31 MED ORDER — SODIUM CHLORIDE 0.9 % IJ SOLN
INTRAMUSCULAR | Status: AC
  Administered 2012-05-31: 3 mL
  Filled 2012-05-31: qty 6

## 2012-05-31 MED ORDER — METOPROLOL TARTRATE 50 MG PO TABS
50.0000 mg | ORAL_TABLET | Freq: Two times a day (BID) | ORAL | Status: DC
  Administered 2012-05-31 – 2012-06-04 (×8): 50 mg via ORAL
  Filled 2012-05-31 (×9): qty 1

## 2012-05-31 MED ORDER — DIAZEPAM 5 MG PO TABS
10.0000 mg | ORAL_TABLET | Freq: Three times a day (TID) | ORAL | Status: DC
  Administered 2012-05-31 – 2012-06-03 (×11): 10 mg via ORAL
  Filled 2012-05-31 (×12): qty 2

## 2012-05-31 NOTE — Progress Notes (Signed)
[facility] PROGRESS NOTE  Donna [name] YQM:[id number] DOB: May 23, 1956 DOA: 05/26/2012 PCP: Louie Boston, MD  Assessment/Plan: Principal Problem:  *Nausea & vomiting Active Problems:  HEPATITIS C  GASTROESOPHAGEAL REFLUX DISEASE  Chest pain  Seizure  Wernicke encephalopathy Oxygen dependent COPD Hypertension  1. Gastroenteritis, likely viral, Appears to have improved. 2. Seizures, possibly related to alcohol withdrawal versus benzodiazepine withdrawal. When speaking to the patient's son, he indicated that the patient obtains Xanax illegaly. He reports she often uses more than 240 pills a month. It is only when she runs out of this medication, is when she starts drinking heavily. He reports this is a chronic ongoing problem and that she has had multiple admissions for the same. He reports that she is admitted monthly with withdrawal seizures to [facility], even needing intubation at one time.  When she is discharged, she returns to using xanax and alcohol until she is in the same situation. She was briefly placed on dilantin per neurology, but that has since been discontinued. She is also on diazepam which is likely helping with any benzodiazepine withdrawal. She will likely need to be discharged on a course of benzos to prevent withdrawal. 3. Wernicke encephalopathy. Per neurology this is likely a Wernicke encephalopathy. She is is on thiamine and folic acid. Her mental status has considerably improved.  She is more clear and focused now.  Psychiatry consult done yesterday felt that she did have capacity to make medical decisions 4. Hypertension.  It appears that her blood pressure continues to fluctuate. Continue to titrate medications.  Currently her blood pressure remains uncontrolled with diastolic values >110 5. Substance abuse.  I had a long discussion with the patient regarding her substance abuse.  She is very adamant that she does not drink heavily and that she takes  xanax as prescribed to her, at night to help with sleep.  She had gone to a rehab program before, and does not wish to go to one at this time, unless it would be fully covered by her insurance.  Social work is involved and is looking into this. She feels she will gain support from her church members and is willing to contact community resources as she feels appropriate.  6. COPD, on home oxygen, stable.   Code Status: full code Family Communication: Discussed with patient at the bedside  Disposition Plan: possible discharge home in the next 24-48hrs   Brief narrative: The study was initially admitted to the hospital with gastroenteritis but subsequently developed status epilepticus. She was transferred to the step down unit and was started on Dilantin. She stabilized and then moved to a regular bed  Consultants:  Neurology  Psychiatry  Procedures:  None  Antibiotics:  None  HPI/Subjective: Complaining of headache and back pain today.  No other complaints.  Objective: Filed Vitals:   05/30/12 1942 05/30/12 2055 05/31/12 0500 05/31/12 0848  BP:  89/63 167/113   Pulse:  73 81   Temp:  97.8 F (36.6 C) 97.9 F (36.6 C)   TempSrc:      Resp:  16 20   Height:      Weight:      SpO2: 96% 93% 93% 93%    Intake/Output Summary (Last 24 hours) at 05/31/12 1229 Last data filed at 05/31/12 0916  Gross per 24 hour  Intake    670 ml  Output      0 ml  Net    670 ml   [employer]  05/27/12 0509 05/27/12 1200 05/28/12 0421  Weight: 65.59 kg (144 lb 9.6 oz) 63.3 kg (139 lb 8.8 oz) 63.7 kg (140 lb 6.9 oz)    Exam:   General:  No acute distress  Cardiovascular: S1, S2, regular rate and rhythm  Respiratory: Clear auscultation bilaterally  Abdomen: Soft, nontender, nondistended, bowel sounds are active  Data Reviewed: Basic Metabolic Panel:  Lab 05/29/12 9528 05/28/12 0416 05/26/12 2108  NA 139 138 141  K 3.4* 2.9* 3.4*  CL 98 96 101  CO2 27 29 28   GLUCOSE 118*  127* 151*  BUN 10 8 6   CREATININE 0.48* 0.53 0.59  CALCIUM 10.0 9.8 9.8  MG -- -- --  PHOS -- -- --   Liver Function Tests:  Lab 05/29/12 0518 05/26/12 2108  AST 32 32  ALT 26 38*  ALKPHOS 126* 132*  BILITOT 0.7 0.4  PROT 7.1 6.9  ALBUMIN 4.3 4.4    Lab 05/26/12 2254  LIPASE 15  AMYLASE --   No results found for this basename: AMMONIA:5 in the last 168 hours CBC:  Lab 05/29/12 0518 05/28/12 0416 05/26/12 2108  WBC 10.2 12.3* 5.7  NEUTROABS -- -- 4.2  HGB 16.9* 17.2* 15.7*  HCT 50.5* 51.8* 47.8*  MCV 94.0 95.4 96.6  PLT 152 180 173   Cardiac Enzymes:  Lab 05/26/12 2254 05/26/12 2108  CKTOTAL -- --  CKMB -- --  CKMBINDEX -- --  TROPONINI <0.30 <0.30   BNP (last 3 results)  Basename 04/23/12 0327 04/18/12 1924  PROBNP 19352.0* 16764.0*   CBG: No results found for this basename: GLUCAP:5 in the last 168 hours  Recent Results (from the past 240 hour(s))  MRSA PCR SCREENING     Status: Normal   Collection Time   05/27/12 12:00 PM      Component Value Range Status Comment   MRSA by PCR NEGATIVE  NEGATIVE Final      Studies: Dg Chest Port 1 View  05/30/2012  *RADIOLOGY REPORT*  Clinical Data: PICC line placement  PORTABLE CHEST - 1 VIEW  Comparison: 05/26/2012  Findings: Right arm PICC terminates in the lower SVC, just above the cavoatrial junction.  Lungs are essentially clear. No pleural effusion or pneumothorax.  Cardiomediastinal silhouette is within normal limits.  IMPRESSION: Right arm PICC terminates in the lower SVC.   Original Report Authenticated By: Charline Bills, M.D.     Scheduled Meds:    . aspirin EC  81 mg Oral Daily  . budesonide-formoterol  2 puff Inhalation BID  . diazepam  10 mg Oral TID  . folic acid  1 mg Oral Daily  . lisinopril  20 mg Oral Daily  . metoprolol tartrate  25 mg Oral BID  . pantoprazole  40 mg Oral Daily  . PARoxetine  40 mg Oral Daily  . sodium chloride  10-40 mL Intracatheter Q12H  . sodium chloride  3 mL  Intravenous Q12H  . sodium chloride      . thiamine  100 mg Intravenous Daily  . tiotropium  18 mcg Inhalation Daily  . DISCONTD: cloNIDine  0.1 mg Oral TID  . DISCONTD: diazepam  5 mg Oral QID   Continuous Infusions:   Principal Problem:  *Nausea & vomiting Active Problems:  HEPATITIS C  GASTROESOPHAGEAL REFLUX DISEASE  Chest pain  Seizure  Wernicke encephalopathy    Time spent: 30 mins    Prophet Renwick  [facility] Pager [phone]. If 7PM-7AM, please contact night-coverage at [email or web address], password [facility] 05/31/2012,  12:29 PM  LOS: 5 days

## 2012-05-31 NOTE — Progress Notes (Signed)
Pt on phone took mdi, but phone was more important

## 2012-05-31 NOTE — Progress Notes (Signed)
[facility] Rahul Malinak [name], MD     [email or web address]        SUBJECTIVE: The patient is awake and alert. No seizures are reported. She continues to have improvement in her symptoms. Blood pressure has been high however. The clonidine was switched to when necessary which seems to have causing significant increase in her blood pressure. Highest is 167/113. The patient admits that she consumes alcohol daily. She reports that she only does about 2 beers daily but previously was drinking a case a day. On the last encounter yesterday she reports not drinking for 5 months however.  OBJECTIVE: The patient is awake and alert. She has a lot more lucid conversations. Thought processes are more coherent. She knows that she is in the hospital at [facility] and that it is 2013. She also is aware that she was previously confused and disoriented. She follows commands well. I see no confabulation on the usual testing. There is no hallucination. Visual fields are intact. Facial muscle strength symmetric. Extraocular movements are intact. She had antigravity strength throughout and coordination is normal.  ASSESSMENT/PLAN:  1. Word acute encephalopathy. The patient continues to improve. She still has significant blood pressure elevation which suggest that she could be having some withdrawal symptoms. The dose of Valium will be increased. She is to continue with the thiamine and folic acid. 2. Alcohol withdrawal seizures. Dilantin has been discontinued.  Beryle Beams, MD Diplomate, American Board of Psychiatry and Neurology (Neurology).

## 2012-05-31 NOTE — Progress Notes (Signed)
Subjective: Interval History:   Objective: Vital signs in last 24 hours: Temp:  [97.8 F (36.6 C)-99 F (37.2 C)] 97.9 F (36.6 C) (09/20 0500) Pulse Rate:  [73-86] 81  (09/20 0500) Resp:  [16-20] 20  (09/20 0500) BP: (89-177)/(63-113) 167/113 mmHg (09/20 0500) SpO2:  [92 %-98 %] 93 % (09/20 0500)  Intake/Output from previous day: 09/19 0701 - 09/20 0700 In: 840 [P.O.:840] Out: -  Intake/Output this shift:   Nutritional status: Dysphagia   Lab Results:  Basename 05/29/12 0518  WBC 10.2  HGB 16.9*  HCT 50.5*  PLT 152  NA 139  K 3.4*  CL 98  CO2 27  GLUCOSE 118*  BUN 10  CREATININE 0.48*  CALCIUM 10.0  LABA1C --   Lipid Panel No results found for this basename: CHOL,TRIG,HDL,CHOLHDL,VLDL,LDLCALC in the last 72 hours  Studies/Results: Dg Chest Port 1 View  05/30/2012  *RADIOLOGY REPORT*  Clinical Data: PICC line placement  PORTABLE CHEST - 1 VIEW  Comparison: 05/26/2012  Findings: Right arm PICC terminates in the lower SVC, just above the cavoatrial junction.  Lungs are essentially clear. No pleural effusion or pneumothorax.  Cardiomediastinal silhouette is within normal limits.  IMPRESSION: Right arm PICC terminates in the lower SVC.   Original Report Authenticated By: Charline Bills, M.D.     Medications:  Scheduled Meds:    . aspirin EC  81 mg Oral Daily  . budesonide-formoterol  2 puff Inhalation BID  . diazepam  5 mg Oral QID  . folic acid  1 mg Oral Daily  . lisinopril  20 mg Oral Daily  . metoprolol tartrate  25 mg Oral BID  . pantoprazole  40 mg Oral Daily  . PARoxetine  40 mg Oral Daily  . sodium chloride  10-40 mL Intracatheter Q12H  . sodium chloride  3 mL Intravenous Q12H  . thiamine  100 mg Intravenous Daily  . tiotropium  18 mcg Inhalation Daily  . DISCONTD: cloNIDine  0.1 mg Oral TID  . DISCONTD: phenytoin (DILANTIN) IV  100 mg Intravenous Q8H   Continuous Infusions:  PRN Meds:.acetaminophen, cloNIDine, LORazepam, morphine injection,  ondansetron (ZOFRAN) IV, ondansetron, sodium chloride, traZODone   Assessment/Plan: Wernick's SZ HTN. Better on clonidine  Also see dictation.   LOS: 5 days   Cartha Rotert

## 2012-05-31 NOTE — Clinical Social Work Note (Signed)
Per MD, pt was willing to consider inpatient rehab for substance abuse treatment earlier today. CSW spoke with pt this afternoon and she states, "I have too many responsibilities to consider inpatient treatment." CSW also spoke with [employer] representative who confirmed pt's copay of $220 a day for first 6 days in rehab. When pt was made aware of this amount, she said there was no way on her fixed income she could consider that amount anyway. She plans to follow up with AA/NA and has meeting schedule.  Derenda Fennel, [place] [phone]

## 2012-05-31 NOTE — Clinical Social Work Psychosocial (Signed)
Clinical Social Work Department BRIEF PSYCHOSOCIAL ASSESSMENT 05/31/2012  Patient:  Donna [name], Donna [name]     Account Number:  [id number]     Admit date:  05/26/2012  Clinical Social Worker:  Nancie Neas  Date/Time:  05/31/2012 08:40 AM  Referred by:  Physician  Date Referred:  05/30/2012 Referred for  Substance Abuse   Other Referral:   Interview type:  Patient Other interview type:    PSYCHOSOCIAL DATA Living Status:   Admitted from facility:   Level of care:   Primary support name:  Donna [name] Primary support relationship to patient:  NEIGHBOR Degree of support available:   supportive per pt.    CURRENT CONCERNS Current Concerns  Substance Abuse   Other Concerns:    SOCIAL WORK ASSESSMENT / PLAN CSW met with pt at bedside following referral for substance abuse. Pt is alert and oriented this morning and states she lives alone. She describes her best support as her neighbor although she does state she has a good relationship with her son but does not see him often due to his work schedule. Pt reports she came to ED due to vomiting, diarrhea, headache, and chest pain and had a seizure once at hospital. She states she has had 3 heart attacks in the last few months.    Pt is aware that she has been very confused the last few days. She addressed substance abuse and states that she has cut back a lot in the last couple months. She was drinking a case a day and now reports about 1 beer per day. She said she has gradually reduced her intake and has been cutting back on smoking as well. Pt reports she is very motivated to change and attributes some of this to her going back to church. She has about a 30 year history of drinking and denies any treatment in the past or current need. She admits to inpatient rehab for drug use about 20 years ago but denies any drug use since this time. She insists that she takes her Xanax and Paxil as prescribed, never taking more. Pt said this is all prescribed by  her PCP Dr. Quentin [name]. Dr. Arlyn [name] told pt that a referral would be made to psychiatrist at her next appointment in about 2 weeks. Pt is agreeable to seeing psychiatrist but does not want referral from hospital and wants to wait for PCP's recommendation. She is prescribed Vicodin for chronic back pain. Pt had telepsych consult yesterday to determine capacity and pt was determined to have capacity for decision making. She wants to return home at d/c. Pt has concerns about her inability to sleep at night and will discuss with MD.   Assessment/plan status:  No Further Intervention Required Other assessment/ plan:   Information/referral to community resources:   Pt declines any resources    PATIENT'S/FAMILY'S RESPONSE TO PLAN OF CARE: Pt feels she has made good progress in cutting back her ETOH use. She states she is done with drinking after she has seen it's effects. She reports no other needs at this time. CSW will sign off.        Donna [name], [place] [phone]

## 2012-05-31 NOTE — Progress Notes (Signed)
Physical Therapy Treatment Patient Details Name: Donna [name] MRN: [id number] DOB: 25-Feb-1956 Today's Date: 05/31/2012 Time: 1050-1108 PT Time Calculation (min): 18 min 1 therex 1 gt  PT Assessment / Plan / Recommendation Comments on Treatment Session  Patient still somewhat impulsive;standing without notice. During standing exercises only HHA required;LOB x1 which patient was able to recover. During 200' of gait training with RW patient required only supervision with no LOB. Patient states she gets off balance frequently without warning and that causes her to fall often.    Follow Up Recommendations       Barriers to Discharge        Equipment Recommendations       Recommendations for Other Services    Frequency     Plan      Precautions / Restrictions     Pertinent Vitals/Pain     Mobility  Bed Mobility Supine to Sit: 6: Modified independent (Device/Increase time) Sit to Supine: 6: Modified independent (Device/Increase time) Transfers Sit to Stand: 6: Modified independent (Device/Increase time) Stand to Sit: 6: Modified independent (Device/Increase time) Ambulation/Gait Ambulation/Gait Assistance: 5: Supervision Ambulation Distance (Feet): 200 Feet Assistive device: Rolling walker Gait Pattern: Narrow base of support;Trunk flexed Gait velocity: WNL Stairs: No Wheelchair Mobility Wheelchair Mobility: No    Exercises General Exercises - Lower Extremity Ankle Circles/Pumps: 15 reps;Both Heel Slides: Both;10 reps Hip ABduction/ADduction: 10 reps;Both Straight Leg Raises: Both;10 reps Other Exercises Other Exercises: bridging x10   PT Diagnosis:    PT Problem List:   PT Treatment Interventions:     PT Goals    Visit Information  Last PT Received On: 05/31/12    Subjective Data      Cognition       Balance     End of Session PT - End of Session Equipment Utilized During Treatment: Gait belt Activity Tolerance: Patient tolerated treatment  well Patient left: in bed;with call bell/phone within reach;with bed alarm set   GP     [name] 05/31/2012, 12:09 PM

## 2012-06-01 ENCOUNTER — Inpatient Hospital Stay (HOSPITAL_COMMUNITY): Payer: Medicare Other

## 2012-06-01 LAB — COMPREHENSIVE METABOLIC PANEL
AST: 22 U/L (ref 0–37)
Albumin: 4.1 g/dL (ref 3.5–5.2)
BUN: 19 mg/dL (ref 6–23)
CO2: 30 mEq/L (ref 19–32)
Calcium: 9.9 mg/dL (ref 8.4–10.5)
Creatinine, Ser: 0.62 mg/dL (ref 0.50–1.10)
GFR calc non Af Amer: >90 mL/min (ref >90)

## 2012-06-01 LAB — CBC
Hemoglobin: 16.7 g/dL — ABNORMAL HIGH (ref 12.0–15.0)
MCH: 32.4 pg (ref 26.0–34.0)
Platelets: 162 10*3/uL (ref 150–400)
RBC: 5.16 MIL/uL — ABNORMAL HIGH (ref 3.87–5.11)
WBC: 8.7 10*3/uL (ref 4.0–10.5)

## 2012-06-01 LAB — LIPASE, BLOOD: Lipase: 26 U/L (ref 11–59)

## 2012-06-01 MED ORDER — HYDRALAZINE HCL 20 MG/ML IJ SOLN
10.0000 mg | Freq: Four times a day (QID) | INTRAMUSCULAR | Status: DC | PRN
  Administered 2012-06-01 – 2012-06-03 (×2): 10 mg via INTRAVENOUS
  Filled 2012-06-01 (×2): qty 1

## 2012-06-01 MED ORDER — TRAZODONE HCL 50 MG PO TABS
50.0000 mg | ORAL_TABLET | Freq: Every day | ORAL | Status: DC
  Administered 2012-06-01 – 2012-06-03 (×3): 50 mg via ORAL
  Filled 2012-06-01 (×3): qty 1

## 2012-06-01 MED ORDER — SODIUM CHLORIDE 0.9 % IJ SOLN
INTRAMUSCULAR | Status: AC
  Administered 2012-06-01: 10 mL
  Filled 2012-06-01: qty 3

## 2012-06-01 MED ORDER — PROMETHAZINE HCL 25 MG/ML IJ SOLN
12.5000 mg | Freq: Four times a day (QID) | INTRAMUSCULAR | Status: DC | PRN
  Administered 2012-06-01: 25 mg via INTRAVENOUS
  Administered 2012-06-01: 12.5 mg via INTRAVENOUS
  Administered 2012-06-02 – 2012-06-04 (×3): 25 mg via INTRAVENOUS
  Filled 2012-06-01 (×5): qty 1

## 2012-06-01 MED ORDER — BIOTENE DRY MOUTH MT LIQD
15.0000 mL | Freq: Two times a day (BID) | OROMUCOSAL | Status: DC
  Administered 2012-06-01 – 2012-06-04 (×7): 15 mL via OROMUCOSAL

## 2012-06-01 NOTE — Progress Notes (Signed)
[facility] PROGRESS NOTE  Donna [name] ZOX:[id number] DOB: [birth year] DOA: 05/26/2012 PCP: Louie Boston, MD  Assessment/Plan: Principal Problem:  *Nausea & vomiting Active Problems:  HEPATITIS C  GASTROESOPHAGEAL REFLUX DISEASE  Chest pain  Seizure  Wernicke encephalopathy Oxygen dependent COPD Hypertension  1. Nausea and vomiting.  She reports continued vomiting and diffuse abd pain.  She is unable to keep even liquids down.  Her po intake has been minimal today.  I am concerned she will not be able to keep down her medications.  Will check lft's and lipase, check abdominal film. She is having only one loose stool per day 2. Seizures, possibly related to alcohol withdrawal versus benzodiazepine withdrawal. When speaking to the patient's son, he indicated that the patient obtains Xanax illegaly. He reports she often uses more than 240 pills a month. It is only when she runs out of this medication, is when she starts drinking heavily. He reports this is a chronic ongoing problem and that she has had multiple admissions for the same. He reports that she is admitted monthly with withdrawal seizures to [facility], even needing intubation at one time.  When she is discharged, she returns to using xanax and alcohol until she is in the same situation. She was briefly placed on dilantin per neurology, but that has since been discontinued. She is also on diazepam which is likely helping with any benzodiazepine withdrawal. She will likely need to be discharged on a course of benzos to prevent withdrawal. 3. Wernicke encephalopathy. Per neurology this is likely a Wernicke encephalopathy. She is is on thiamine and folic acid. Her mental status has considerably improved.  She is more clear and focused now.  Psychiatry consult done  felt that she did have capacity to make medical decisions 4. Hypertension.  Blood pressure is very labile. It ranges from systolic values of 180 to 80. Will  discontinue clonidine, as I think she is very sensitive to its effects. Will try low dose hydralazine for prn use. 5. Substance abuse.  I had a long discussion with the patient regarding her substance abuse.  She is very adamant that she does not drink heavily and that she takes xanax as prescribed to her, at night to help with sleep.  She had gone to a rehab program before, and does not wish to go to one at this time. She feels she will gain support from her church members and is willing to contact community resources as she feels appropriate.  6. COPD, on home oxygen, stable.   Code Status: full code Family Communication: Discussed with patient at the bedside  Disposition Plan: possible discharge home in the next 24-48hrs   Brief narrative: The study was initially admitted to the hospital with gastroenteritis but subsequently developed status epilepticus. She was transferred to the step down unit and was started on Dilantin. She stabilized and then moved to a regular bed  Consultants:  Neurology  Psychiatry  Procedures:  None  Antibiotics:  None  HPI/Subjective: Continued nausea and vomiting, she is seen sitting next to an emesis basin.  Complains of diffuse abdominal pain.  Has had one loose stool today.  Objective: Filed Vitals:   06/01/12 0201 06/01/12 0450 06/01/12 0516 06/01/12 0735  BP:   177/94   Pulse: 56 62    Temp:  97.7 F (36.5 C)    TempSrc:  Oral    Resp:  18    Height:      Weight:  SpO2: 98% 97%  97%    Intake/Output Summary (Last 24 hours) at 06/01/12 1429 Last data filed at 06/01/12 0600  Gross per 24 hour  Intake    870 ml  Output    750 ml  Net    120 ml   Filed Weights   05/27/12 0509 05/27/12 1200 05/28/12 0421  Weight: 65.59 kg (144 lb 9.6 oz) 63.3 kg (139 lb 8.8 oz) 63.7 kg (140 lb 6.9 oz)    Exam:   General:  No acute distress  Cardiovascular: S1, S2, regular rate and rhythm  Respiratory: Clear auscultation  bilaterally  Abdomen: Soft, mildy tender diffusely, nondistended, bowel sounds are active  Data Reviewed: Basic Metabolic Panel:  Lab 05/29/12 1914 05/28/12 0416 05/26/12 2108  NA 139 138 141  K 3.4* 2.9* 3.4*  CL 98 96 101  CO2 27 29 28   GLUCOSE 118* 127* 151*  BUN 10 8 6   CREATININE 0.48* 0.53 0.59  CALCIUM 10.0 9.8 9.8  MG -- -- --  PHOS -- -- --   Liver Function Tests:  Lab 05/29/12 0518 05/26/12 2108  AST 32 32  ALT 26 38*  ALKPHOS 126* 132*  BILITOT 0.7 0.4  PROT 7.1 6.9  ALBUMIN 4.3 4.4    Lab 05/26/12 2254  LIPASE 15  AMYLASE --   No results found for this basename: AMMONIA:5 in the last 168 hours CBC:  Lab 05/29/12 0518 05/28/12 0416 05/26/12 2108  WBC 10.2 12.3* 5.7  NEUTROABS -- -- 4.2  HGB 16.9* 17.2* 15.7*  HCT 50.5* 51.8* 47.8*  MCV 94.0 95.4 96.6  PLT 152 180 173   Cardiac Enzymes:  Lab 05/26/12 2254 05/26/12 2108  CKTOTAL -- --  CKMB -- --  CKMBINDEX -- --  TROPONINI <0.30 <0.30   BNP (last 3 results)  Basename 04/23/12 0327 04/18/12 1924  PROBNP 19352.0* 16764.0*   CBG: No results found for this basename: GLUCAP:5 in the last 168 hours  Recent Results (from the past 240 hour(s))  MRSA PCR SCREENING     Status: Normal   Collection Time   05/27/12 12:00 PM      Component Value Range Status Comment   MRSA by PCR NEGATIVE  NEGATIVE Final      Studies: No results found.  Scheduled Meds:    . antiseptic oral rinse  15 mL Mouth Rinse BID  . aspirin EC  81 mg Oral Daily  . budesonide-formoterol  2 puff Inhalation BID  . diazepam  10 mg Oral TID  . folic acid  1 mg Oral Daily  . lisinopril  20 mg Oral Daily  . metoprolol tartrate  50 mg Oral BID  . pantoprazole  40 mg Oral Daily  . PARoxetine  40 mg Oral Daily  . sodium chloride  10-40 mL Intracatheter Q12H  . sodium chloride  3 mL Intravenous Q12H  . sodium chloride      . thiamine  100 mg Intravenous Daily  . tiotropium  18 mcg Inhalation Daily   Continuous Infusions:    Principal Problem:  *Nausea & vomiting Active Problems:  HEPATITIS C  GASTROESOPHAGEAL REFLUX DISEASE  Chest pain  Seizure  Wernicke encephalopathy    Time spent: 30 mins    Casten Floren  [facility] Pager [phone]. If 7PM-7AM, please contact night-coverage at [email or web address], password [facility] 06/01/2012, 2:29 PM  LOS: 6 days

## 2012-06-02 ENCOUNTER — Encounter (HOSPITAL_COMMUNITY)
Admission: EM | Disposition: A | Discharge status: Home / Self Care | Payer: Self-pay | Source: Home / Self Care | Attending: Internal Medicine

## 2012-06-02 ENCOUNTER — Encounter (HOSPITAL_COMMUNITY): Payer: Self-pay | Admitting: *Deleted

## 2012-06-02 DIAGNOSIS — R112 Nausea with vomiting, unspecified: Secondary | ICD-10-CM

## 2012-06-02 DIAGNOSIS — J449 Chronic obstructive pulmonary disease, unspecified: Secondary | ICD-10-CM | POA: Diagnosis present

## 2012-06-02 DIAGNOSIS — K449 Diaphragmatic hernia without obstruction or gangrene: Secondary | ICD-10-CM

## 2012-06-02 DIAGNOSIS — R109 Unspecified abdominal pain: Secondary | ICD-10-CM

## 2012-06-02 DIAGNOSIS — K228 Other specified diseases of esophagus: Secondary | ICD-10-CM

## 2012-06-02 DIAGNOSIS — E876 Hypokalemia: Secondary | ICD-10-CM | POA: Diagnosis present

## 2012-06-02 DIAGNOSIS — F1910 Other psychoactive substance abuse, uncomplicated: Secondary | ICD-10-CM | POA: Diagnosis present

## 2012-06-02 DIAGNOSIS — I10 Essential (primary) hypertension: Secondary | ICD-10-CM | POA: Diagnosis present

## 2012-06-02 HISTORY — PX: ESOPHAGOGASTRODUODENOSCOPY: SHX5428

## 2012-06-02 SURGERY — EGD (ESOPHAGOGASTRODUODENOSCOPY)
Anesthesia: Moderate Sedation

## 2012-06-02 MED ORDER — MEPERIDINE HCL 25 MG/ML IJ SOLN
INTRAMUSCULAR | Status: DC | PRN
  Administered 2012-06-02 (×2): 25 mg via INTRAVENOUS

## 2012-06-02 MED ORDER — MIDAZOLAM HCL 5 MG/5ML IJ SOLN
INTRAMUSCULAR | Status: DC | PRN
  Administered 2012-06-02 (×2): 2 mg via INTRAVENOUS
  Administered 2012-06-02 (×2): 3 mg via INTRAVENOUS
  Administered 2012-06-02: 2 mg via INTRAVENOUS
  Administered 2012-06-02: 3 mg via INTRAVENOUS

## 2012-06-02 MED ORDER — POTASSIUM CHLORIDE CRYS ER 20 MEQ PO TBCR
40.0000 meq | EXTENDED_RELEASE_TABLET | Freq: Once | ORAL | Status: AC
  Administered 2012-06-02: 40 meq via ORAL
  Filled 2012-06-02: qty 2

## 2012-06-02 MED ORDER — MIDAZOLAM HCL 5 MG/5ML IJ SOLN
INTRAMUSCULAR | Status: AC
  Filled 2012-06-02: qty 5

## 2012-06-02 MED ORDER — STERILE WATER FOR IRRIGATION IR SOLN
Status: DC | PRN
  Administered 2012-06-02: 13:00:00

## 2012-06-02 MED ORDER — VITAMIN B-1 100 MG PO TABS
100.0000 mg | ORAL_TABLET | Freq: Every day | ORAL | Status: DC
  Administered 2012-06-03 – 2012-06-04 (×2): 100 mg via ORAL
  Filled 2012-06-02 (×2): qty 1

## 2012-06-02 MED ORDER — MIDAZOLAM HCL 5 MG/5ML IJ SOLN
INTRAMUSCULAR | Status: AC
  Filled 2012-06-02: qty 10

## 2012-06-02 MED ORDER — MEPERIDINE HCL 50 MG/ML IJ SOLN
INTRAMUSCULAR | Status: AC
  Filled 2012-06-02: qty 1

## 2012-06-02 MED ORDER — BUTAMBEN-TETRACAINE-BENZOCAINE 2-2-14 % EX AERO
INHALATION_SPRAY | CUTANEOUS | Status: DC | PRN
  Administered 2012-06-02: 1 via TOPICAL

## 2012-06-02 MED ORDER — SODIUM CHLORIDE 0.9 % IV SOLN
INTRAVENOUS | Status: DC
  Administered 2012-06-02: 10 mL/h via INTRAVENOUS

## 2012-06-02 NOTE — Progress Notes (Signed)
[facility] PROGRESS NOTE  Donna [name] WUJ:[id number] DOB: 03/11/1956 DOA: 05/26/2012 PCP: Louie Boston, MD  Assessment/Plan: Principal Problem:  *Nausea & vomiting Active Problems:  HEPATITIS C  GASTROESOPHAGEAL REFLUX DISEASE  Chest pain  Seizure  Wernicke encephalopathy  Hypokalemia  COPD (chronic obstructive pulmonary disease)  Hypertension  Substance abuse  1. Nausea and vomiting.  She reports continued vomiting and diffuse abd pain.  She is unable to keep even liquids down.  Her po intake has been minimal today.  I am concerned she will not be able to keep down her medications. LFTs, lipase and abdominal film have been unremarkable. She is post cholecystectomy. It is unclear as to the cause of her symptoms.  She is known to Dr. Karilyn Cota when she was treated for Hep C.  We will ask for his input regarding any further work up. 2. Seizures, possibly related to alcohol withdrawal versus benzodiazepine withdrawal. When speaking to the patient's son, he indicated that the patient obtains Xanax illegaly. He reports she often uses more than 240 pills a month. It is only when she runs out of this medication, is when she starts drinking heavily. He reports this is a chronic ongoing problem and that she has had multiple admissions for the same. He reports that she is admitted monthly with withdrawal seizures to [facility], even needing intubation at one time.  When she is discharged, she returns to using xanax and alcohol until she is in the same situation. She was briefly placed on dilantin per neurology, but that has since been discontinued. She is also on diazepam which is likely helping with any benzodiazepine withdrawal. She will likely need to be discharged on a course of benzos to prevent withdrawal. 3. Wernicke encephalopathy. Per neurology the confusion she had on admission was likely a Wernicke encephalopathy. She is is on thiamine and folic acid. Her mental status has  considerably improved.  She is more clear and focused now.  Psychiatry consult done  felt that she did have capacity to make medical decisions 4. Hypertension.  Blood pressure is very labile. It ranges from systolic values of 180 to 80. Will discontinue clonidine, as I think she is very sensitive to its effects. Will try low dose hydralazine for prn use.  Blood pressures appear to be stabilizing today. 5. Substance abuse.  I had a long discussion with the patient regarding her substance abuse.  She is very adamant that she does not drink heavily and that she takes xanax as prescribed to her, at night to help with sleep.  She had gone to a rehab program before, and does not wish to go to one at this time. She feels she will gain support from her church members and is willing to contact community resources as she feels appropriate.  6. COPD, on home oxygen, stable. 7. Hypokalemia, replace.   Code Status: full code Family Communication: Discussed with patient at the bedside  Disposition Plan: patient will need to go home with home health services when her GI issues are stable and she can keep down her food/medications   Brief narrative: The study was initially admitted to the hospital with gastroenteritis but subsequently developed status epilepticus. She was transferred to the step down unit and was started on Dilantin. She stabilized and then moved to a regular bed  Consultants:  Neurology  Psychiatry  Gastroenterology  Procedures:  None  Antibiotics:  None  HPI/Subjective: Complaining of back pain and abdominal pain.  Reports persistent vomiting,  has emesis basin next to her with clear fluid in it. Unable to keep down anything by mouth.  Objective: Filed Vitals:   06/01/12 2120 06/01/12 2310 06/02/12 0540 06/02/12 0710  BP: 176/133 152/96 142/90   Pulse: 94  92   Temp: 98 F (36.7 C)  98.1 F (36.7 C)   TempSrc: Oral  Oral   Resp: 18  18   Height:      Weight:      SpO2:  97%  92% 97%    Intake/Output Summary (Last 24 hours) at 06/02/12 1027 Last data filed at 06/01/12 2142  Gross per 24 hour  Intake    240 ml  Output    203 ml  Net     37 ml   Filed Weights   05/27/12 0509 05/27/12 1200 05/28/12 0421  Weight: 65.59 kg (144 lb 9.6 oz) 63.3 kg (139 lb 8.8 oz) 63.7 kg (140 lb 6.9 oz)    Exam:   General:  No acute distress  Cardiovascular: S1, S2, regular rate and rhythm  Respiratory: Clear auscultation bilaterally  Abdomen: Soft, mildy tender diffusely, nondistended, bowel sounds are active  Data Reviewed: Basic Metabolic Panel:  Lab 06/01/12 4098 05/29/12 0518 05/28/12 0416 05/26/12 2108  NA 138 139 138 141  K 3.2* 3.4* 2.9* 3.4*  CL 95* 98 96 101  CO2 30 27 29 28   GLUCOSE 107* 118* 127* 151*  BUN 19 10 8 6   CREATININE 0.62 0.48* 0.53 0.59  CALCIUM 9.9 10.0 9.8 9.8  MG -- -- -- --  PHOS -- -- -- --   Liver Function Tests:  Lab 06/01/12 1521 05/29/12 0518 05/26/12 2108  AST 22 32 32  ALT 19 26 38*  ALKPHOS 116 126* 132*  BILITOT 0.4 0.7 0.4  PROT 6.9 7.1 6.9  ALBUMIN 4.1 4.3 4.4    Lab 06/01/12 1521 05/26/12 2254  LIPASE 26 15  AMYLASE -- --   No results found for this basename: AMMONIA:5 in the last 168 hours CBC:  Lab 06/01/12 1521 05/29/12 0518 05/28/12 0416 05/26/12 2108  WBC 8.7 10.2 12.3* 5.7  NEUTROABS -- -- -- 4.2  HGB 16.7* 16.9* 17.2* 15.7*  HCT 48.2* 50.5* 51.8* 47.8*  MCV 93.4 94.0 95.4 96.6  PLT 162 152 180 173   Cardiac Enzymes:  Lab 05/26/12 2254 05/26/12 2108  CKTOTAL -- --  CKMB -- --  CKMBINDEX -- --  TROPONINI <0.30 <0.30   BNP (last 3 results)  Basename 04/23/12 0327 04/18/12 1924  PROBNP 19352.0* 16764.0*   CBG: No results found for this basename: GLUCAP:5 in the last 168 hours  Recent Results (from the past 240 hour(s))  MRSA PCR SCREENING     Status: Normal   Collection Time   05/27/12 12:00 PM      Component Value Range Status Comment   MRSA by PCR NEGATIVE  NEGATIVE Final       Studies: Dg Abd 1 View  06/01/2012  *RADIOLOGY REPORT*  Clinical Data: Vomiting, abdominal pain  ABDOMEN - 1 VIEW  Comparison: CT abdomen pelvis dated 04/07/2012  Findings: Nonobstructive bowel gas pattern.  Cholecystectomy clips.  Ray cage fusion at L5-S1.  IMPRESSION: No evidence of bowel obstruction.   Original Report Authenticated By: Charline Bills, M.D.     Scheduled Meds:    . antiseptic oral rinse  15 mL Mouth Rinse BID  . aspirin EC  81 mg Oral Daily  . budesonide-formoterol  2 puff Inhalation BID  .  diazepam  10 mg Oral TID  . folic acid  1 mg Oral Daily  . lisinopril  20 mg Oral Daily  . metoprolol tartrate  50 mg Oral BID  . pantoprazole  40 mg Oral Daily  . PARoxetine  40 mg Oral Daily  . sodium chloride  10-40 mL Intracatheter Q12H  . sodium chloride  3 mL Intravenous Q12H  . thiamine  100 mg Oral Daily  . tiotropium  18 mcg Inhalation Daily  . traZODone  50 mg Oral QHS  . DISCONTD: thiamine  100 mg Intravenous Daily   Continuous Infusions:   Principal Problem:  *Nausea & vomiting Active Problems:  HEPATITIS C  GASTROESOPHAGEAL REFLUX DISEASE  Chest pain  Seizure  Wernicke encephalopathy  Hypokalemia  COPD (chronic obstructive pulmonary disease)  Hypertension  Substance abuse    Time spent: 30 mins    Donna [name]  [facility] Pager [phone]. If 7PM-7AM, please contact night-coverage at [email or web address], password [facility] 06/02/2012, 10:27 AM  LOS: 7 days

## 2012-06-02 NOTE — Progress Notes (Signed)
Pt took med on own , she states mdi was in room as she was going home.

## 2012-06-02 NOTE — Op Note (Signed)
EGD PROCEDURE REPORT  PATIENT:  Donna [name]  MR#:  [id number] Birthdate:  [birth year], 56 y.o., female Endoscopist:  Dr. Malissa Hippo, MD  Procedure Date: 06/02/2012  Procedure:   EGD  Indications:  Patient is 56 year old Caucasian female with multiple medical problems with persistent nausea vomiting and abdominal pain.            Informed Consent:  The risks, benefits, alternatives & imponderables which include, but are not limited to, bleeding, infection, perforation, drug reaction and potential missed lesion have been reviewed.  The potential for biopsy, lesion removal, esophageal dilation, etc. have also been discussed.  Questions have been answered.  All parties agreeable.  Please see history & physical in medical record for more information.  Medications:  Demerol 50 mg IV Versed 15 mg IV Cetacaine spray topically for oropharyngeal anesthesia  Description of procedure:  The endoscope was introduced through the mouth and advanced to the second portion of the duodenum without difficulty or limitations. The mucosal surfaces were surveyed very carefully during advancement of the scope and upon withdrawal.  Findings:  Esophagus:  Mucosa of the proximal and middle third was normal. Mucosa of distal third was also normal with exception of 10 x 20 mm patch of salmon-colored mucosa. Therefore biopsy was taken to rule out short segment Barrett's. GEJ:  38 cm Hiatus:  40 cm Stomach:  There was bile and 2 pills in stomach. Him and distended very well with insufflation. Folds in the proximal stomach were normal. Examination mucosa at body, antrum, pyloric channel, angularis, fundus and cardia was normal. Duodenum:  Normal bulbar and post bulbar mucosa.  Therapeutic/Diagnostic Maneuvers Performed:  Biopsy taken from distal esophagus for routine histology as above.  Complications:  None  Impression: No evidence of peptic ulcer disease or pyloric stenosis. Small sliding hiatal  hernia. Single small patch of salmon-colored mucosa at distal esophagus. Biopsy  taken to rule out short segment Barrett's.  Recommendations:  Abdomino-pelvic CT with contrast in a.m. Continue PPI.  REHMAN,NAJEEB U  06/02/2012  1:40 PM  CC: Dr. Louie Boston, MD & Dr. Bonnetta Barry ref. provider found

## 2012-06-02 NOTE — Consult Note (Signed)
Patient evaluated for nausea vomiting and abdominal pain. Lead refer to dictated consultation note for details. Need to rule out peptic ulcer disease. If EGD is negative and consider abdominopelvic CT. Is possible symptoms are secondary to anxiety or related to Wernicke's encephalopathy which is being treated. Patient will undergo EGD this afternoon. History of chronic hepatitis C successfully treated 10 years ago. Will document that  HCV RNA is negative.

## 2012-06-02 NOTE — Progress Notes (Signed)
The patient is receiving Thiamine by the intravenous route.  Based on criteria approved by the Pharmacy and Therapeutics Committee and the Medical Executive Committee, the medication is being converted to the equivalent oral dose form.  These criteria include: -No Active GI bleeding -Able to tolerate diet of full liquids (or better) or tube feeding OR able to tolerate other medications by the oral or enteral route  If you have any questions about this conversion, please contact the Pharmacy Department (ext [phone]).  Thank you.  Mady Gemma, [facility] 06/02/2012 10:23 AM

## 2012-06-03 ENCOUNTER — Inpatient Hospital Stay (HOSPITAL_COMMUNITY): Payer: Medicare Other

## 2012-06-03 ENCOUNTER — Other Ambulatory Visit (HOSPITAL_COMMUNITY): Payer: Medicare Other

## 2012-06-03 ENCOUNTER — Encounter (HOSPITAL_COMMUNITY): Payer: Self-pay | Admitting: Internal Medicine

## 2012-06-03 DIAGNOSIS — F1910 Other psychoactive substance abuse, uncomplicated: Secondary | ICD-10-CM

## 2012-06-03 DIAGNOSIS — E876 Hypokalemia: Secondary | ICD-10-CM

## 2012-06-03 DIAGNOSIS — E519 Thiamine deficiency, unspecified: Secondary | ICD-10-CM

## 2012-06-03 LAB — BASIC METABOLIC PANEL
BUN: 16 mg/dL (ref 6–23)
Calcium: 9.8 mg/dL (ref 8.4–10.5)
Creatinine, Ser: 0.64 mg/dL (ref 0.50–1.10)
GFR calc Af Amer: >90 mL/min (ref >90)
GFR calc non Af Amer: >90 mL/min (ref >90)

## 2012-06-03 LAB — HCV RNA QUANT: HCV Quantitative: NOT DETECTED IU/mL — ABNORMAL LOW (ref <43)

## 2012-06-03 MED ORDER — METOCLOPRAMIDE HCL 10 MG PO TABS
5.0000 mg | ORAL_TABLET | Freq: Three times a day (TID) | ORAL | Status: DC
  Administered 2012-06-03 (×2): 5 mg via ORAL
  Filled 2012-06-03 (×2): qty 1

## 2012-06-03 MED ORDER — PANTOPRAZOLE SODIUM 40 MG PO TBEC
40.0000 mg | DELAYED_RELEASE_TABLET | Freq: Every day | ORAL | Status: DC
  Administered 2012-06-04: 40 mg via ORAL
  Filled 2012-06-03: qty 1

## 2012-06-03 MED ORDER — IOHEXOL 300 MG/ML  SOLN
100.0000 mL | Freq: Once | INTRAMUSCULAR | Status: AC | PRN
  Administered 2012-06-03: 100 mL via INTRAVENOUS

## 2012-06-03 MED ORDER — PANTOPRAZOLE SODIUM 40 MG PO TBEC: 80.0000 mg | DELAYED_RELEASE_TABLET | Freq: Every day | ORAL | Status: DC

## 2012-06-03 MED ORDER — PRO-STAT SUGAR FREE PO LIQD
30.0000 mL | Freq: Three times a day (TID) | ORAL | Status: DC
  Administered 2012-06-04: 30 mL via ORAL
  Filled 2012-06-03: qty 30

## 2012-06-03 MED ORDER — HYDROCODONE-ACETAMINOPHEN 5-325 MG PO TABS
1.5000 | ORAL_TABLET | Freq: Two times a day (BID) | ORAL | Status: DC
  Administered 2012-06-04 (×2): 1.5 via ORAL
  Filled 2012-06-03 (×2): qty 2

## 2012-06-03 MED ORDER — POTASSIUM CHLORIDE CRYS ER 20 MEQ PO TBCR
40.0000 meq | EXTENDED_RELEASE_TABLET | Freq: Once | ORAL | Status: AC
  Administered 2012-06-03: 40 meq via ORAL
  Filled 2012-06-03: qty 2

## 2012-06-03 MED ORDER — BOOST / RESOURCE BREEZE PO LIQD
1.0000 | Freq: Two times a day (BID) | ORAL | Status: DC
  Filled 2012-06-03 (×5): qty 1

## 2012-06-03 MED ORDER — LISINOPRIL 10 MG PO TABS: 40.0000 mg | ORAL_TABLET | Freq: Every day | ORAL | Status: DC

## 2012-06-03 MED ORDER — ALPRAZOLAM 1 MG PO TABS
1.0000 mg | ORAL_TABLET | Freq: Three times a day (TID) | ORAL | Status: DC
  Administered 2012-06-04: 1 mg via ORAL
  Filled 2012-06-03: qty 1

## 2012-06-03 NOTE — Progress Notes (Signed)
INITIAL ADULT NUTRITION ASSESSMENT Date: 06/03/2012   Time: 3:35 PM Reason for Assessment: LOS day-8  ASSESSMENT: Female 56 y.o.  Dx: Nausea & vomiting   Past Medical History  Diagnosis Date  . Coronary artery disease   . Degenerative disc disease   . Asthma   . Lupus   . Hepatitis C   . Heart attack    Scheduled Meds:   . antiseptic oral rinse  15 mL Mouth Rinse BID  . aspirin EC  81 mg Oral Daily  . budesonide-formoterol  2 puff Inhalation BID  . diazepam  10 mg Oral TID  . folic acid  1 mg Oral Daily  . lisinopril  20 mg Oral Daily  . metoCLOPramide  5 mg Oral TID AC  . metoprolol tartrate  50 mg Oral BID  . pantoprazole  40 mg Oral Daily  . PARoxetine  40 mg Oral Daily  . potassium chloride  40 mEq Oral Once  . sodium chloride  10-40 mL Intracatheter Q12H  . sodium chloride  3 mL Intravenous Q12H  . thiamine  100 mg Oral Daily  . tiotropium  18 mcg Inhalation Daily  . traZODone  50 mg Oral QHS  . DISCONTD: pantoprazole  40 mg Oral Daily  . DISCONTD: pantoprazole  80 mg Oral Daily   Continuous Infusions:  PRN Meds:.acetaminophen, hydrALAZINE, LORazepam, promethazine, sodium chloride  Ht: 5\' 3"  (160 cm)  Wt: 140 lb 6.9 oz (63.7 kg)  Ideal Wt: 52.4 kg % Ideal Wt: 121%  Usual Wt: Wt Readings from Last 10 Encounters:  05/28/12 140 lb 6.9 oz (63.7 kg)  05/28/12 140 lb 6.9 oz (63.7 kg)  05/14/12 151 lb 9.6 oz (68.765 kg)  05/09/12 152 lb 6.4 oz (69.128 kg)  04/26/12 155 lb 10.3 oz (70.6 kg)    Body mass index is 24.88 kg/(m^2).Normal Range  Food/Nutrition Related Hx: Pt was transferred to [facility] in August from [facility] required intubation and EN support. Assessed by [facility] RD 04/23/12 pt wt 155#. Current wt reflects severe wt loss of 15#, 9% in past 30 days. C/o N/V on admission. Chewing difficulty due to missing teeth. Able to feed self. Corn, egg allergy noted.  Pt meets criteria for severe malnutrition in the context of acute illenss as evidenced by  energy intake <75% for >7days and wt loss of 9% in 1 month.    CMP     Component Value Date/Time   NA 136 06/03/2012 0530   K 3.3* 06/03/2012 0530   CL 97 06/03/2012 0530   CO2 29 06/03/2012 0530   GLUCOSE 113* 06/03/2012 0530   BUN 16 06/03/2012 0530   CREATININE 0.64 06/03/2012 0530   CALCIUM 9.8 06/03/2012 0530   PROT 6.9 06/01/2012 1521   ALBUMIN 4.1 06/01/2012 1521   AST 22 06/01/2012 1521   ALT 19 06/01/2012 1521   ALKPHOS 116 06/01/2012 1521   BILITOT 0.4 06/01/2012 1521   GFRNONAA >90 06/03/2012 0530   GFRAA >90 06/03/2012 0530     Intake/Output Summary (Last 24 hours) at 06/03/12 1544 Last data filed at 06/03/12 0539  Gross per 24 hour  Intake      0 ml  Output    750 ml  Net   -750 ml   Diet Order: Cardiac po's 0-50% (day-8)  Supplements/Tube Feeding:  IVF:    Estimated Nutritional Needs:   Kcal:1600-1875 kcal/day Protein:80-90 gr/day Fluid:1 ml/kcal  NUTRITION DIAGNOSIS: -Inadequate oral intake (NI-2.1).  Status: Ongoing  RELATED TO: nausea,  recent multiple hospitalizations  AS EVIDENCE BY: pt diet hx and meal intake records, wt loss of 15# x30d.   MONITORING/EVALUATION(Goals): Monitor: po intake and tolerance of meals and supplements Goal: Pt to meet >/= 90% of their estimated nutrition needs; not met  EDUCATION NEEDS: -Education needs addressed  INTERVENTION: -ProStat TID (300kcal, 45 gr protein) daily -Resource Breeze po BID each supplement provides 250 kcal and 9 grams of protein. -RD will cont to follow  Dietitian [phone]  DOCUMENTATION CODES Per approved criteria  -Severe malnutrition in the context of acute illness or injury    Francene Boyers 06/03/2012, 3:35 PM

## 2012-06-03 NOTE — Progress Notes (Signed)
     Subjective: This lady seems to be having trouble with nausea and vomiting every time she is. She does not have any significant abdominal pain. Her mental status seemed to improve significantly.           Physical Exam: Blood pressure 146/94, pulse 83, temperature 98.2 F (36.8 C), temperature source Oral, resp. rate 18, height 5\' 3"  (1.6 m), weight 63.7 kg (140 lb 6.9 oz), SpO2 92.00%. She looks systemically well. She is alert and oriented. She is not clinically dehydrated. There are no focal neurological signs.   Investigations:  Recent Results (from the past 240 hour(s))  MRSA PCR SCREENING     Status: Normal   Collection Time   05/27/12 12:00 PM      Component Value Range Status Comment   MRSA by PCR NEGATIVE  NEGATIVE Final      Basic Metabolic Panel:  Basename 06/03/12 0530 06/01/12 1521  NA 136 138  K 3.3* 3.2*  CL 97 95*  CO2 29 30  GLUCOSE 113* 107*  BUN 16 19  CREATININE 0.64 0.62  CALCIUM 9.8 9.9  MG -- --  PHOS -- --   Liver Function Tests:  Basename 06/01/12 1521  AST 22  ALT 19  ALKPHOS 116  BILITOT 0.4  PROT 6.9  ALBUMIN 4.1     CBC:  Basename 06/01/12 1521  WBC 8.7  NEUTROABS --  HGB 16.7*  HCT 48.2*  MCV 93.4  PLT 162    Dg Abd 1 View  06/01/2012  *RADIOLOGY REPORT*  Clinical Data: Vomiting, abdominal pain  ABDOMEN - 1 VIEW  Comparison: CT abdomen pelvis dated 04/07/2012  Findings: Nonobstructive bowel gas pattern.  Cholecystectomy clips.  Ray cage fusion at L5-S1.  IMPRESSION: No evidence of bowel obstruction.   Original Report Authenticated By: Charline Bills, M.D.       Medications: I have reviewed the patient's current medications.  Impression: 1. Nausea and vomiting of unclear etiology. 2. History of hepatitis C. 3. GERD. 4. Withdrawal seizure, either from alcohol or benzodiazepines. 5. Probable Wernicke's encephalopathy. 6. History of alcohol and benzodiazepine abuse. 7. COPD, stable. 8. Hypertension,  labile. 9. Hypokalemia.     Plan: 1. Await CT abdominal scan was ordered by Dr. Karilyn Cota. 2. Replete potassium.     LOS: 8 days   Wilson Singer Pager [phone]  06/03/2012, 10:35 AM

## 2012-06-03 NOTE — Progress Notes (Signed)
[facility] Donna [name] [name], MD     [email or web address]        The patient continues to improve. She still continues to complain of back pain and headache and pain all over. Cognition again is improved. She continues to have significant nausea and vomiting which seems to be a more recent issue with the patient. The patient reports taking Vicodin 2 tablets at home. Some of the nausea and vomiting could possibly due to withdrawal from opioids and benzodiazepines.   GENERAL: She is in no acute distress.  HEENT: Supple. Atraumatic normocephalic.   ABDOMEN: soft  EXTREMITIES: No edema   BACK: Normal.  SKIN: Normal by inspection.    MENTAL STATUS: She is dosing in the exam room but easily arousable. She is oriented to time, person and place. She follows commands well and has performed intelligible thoughts and conversations.  CRANIAL NERVES: Pupils are equal, round and reactive to light and accomodation;  upper and lower facial muscles are normal in strength and symmetric, there is no flattening of the nasolabial folds.  MOTOR: Normal tone, bulk and strength; no pronator drift.  COORDINATION: Left finger to nose is normal, right finger to nose is normal, No rest tremor; no intention tremor; no postural tremor; no bradykinesia.  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 The patient is on thiamine and folic acid. 2. She continues to have poorly controlled hypertension with some lability. I think we should increase the lisinopril. 3. Significant nausea and vomiting. This is being worked up by GI. So this could be due to withdrawal from opioids and benzodiazepines. I will start the patient on a low to moderate dose of Vicodin and she is complaining of pain.

## 2012-06-03 NOTE — Progress Notes (Signed)
Subjective: Interval History:   Objective: Vital signs in last 24 hours: Temp:  [97.5 F (36.4 C)-98.9 F (37.2 C)] 98.9 F (37.2 C) (09/23 1400) Pulse Rate:  [81-89] 89  (09/23 1400) Resp:  [18-20] 18  (09/23 1400) BP: (146-183)/(94-137) 168/109 mmHg (09/23 1400) SpO2:  [92 %-96 %] 94 % (09/23 1400)  Intake/Output from previous day: 09/22 0701 - 09/23 0700 In: 720 [P.O.:320; I.V.:400] Out: 760 [Urine:750; Emesis/NG output:10] Intake/Output this shift:   Nutritional status: Cardiac   Lab Results:  Basename 06/03/12 0530 06/01/12 1521  WBC -- 8.7  HGB -- 16.7*  HCT -- 48.2*  PLT -- 162  NA 136 138  K 3.3* 3.2*  CL 97 95*  CO2 29 30  GLUCOSE 113* 107*  BUN 16 19  CREATININE 0.64 0.62  CALCIUM 9.8 9.9  LABA1C -- --   Lipid Panel No results found for this basename: CHOL,TRIG,HDL,CHOLHDL,VLDL,LDLCALC in the last 72 hours  Studies/Results: Ct Abdomen W Contrast  06/03/2012  *RADIOLOGY REPORT*  Clinical Data: Abdominal pain.  Nausea and vomiting.  Chronic hepatitis C.  CT ABDOMEN WITH CONTRAST  Technique:  Multidetector CT imaging of the abdomen was performed following the standard protocol during bolus administration of intravenous contrast.  Contrast: OMNIPAQUE IOHEXOL 300 MG/ML  SOLN  Comparison: 04/07/2012  Findings: Surgical clips again seen from prior cholecystectomy. Biliary dilatation has resolved since previous study, and mild pneumobilia noted in the liver consistent with previous sphincterotomy.  Focal fatty infiltration seen adjacent the falciform ligament, but no liver masses are identified.  Mild nodularity of the capsular surface of the liver is seen vertically involving the left hepatic lobe, consistent with mild cirrhosis. No evidence of splenomegaly or ascites.  No evidence of pancreatic mass or pancreatic ductal dilatation.  No evidence of peripancreatic inflammatory changes or fluid collections.  The spleen, adrenal glands, and kidneys are normal in  appearance.  No evidence of hydronephrosis.  No intra abdominal soft tissue masses or lymphadenopathy identified.  No evidence of inflammatory process or abnormal fluid collections.  A tiny periumbilical hernia is again seen containing only fat.  IMPRESSION:  1.  No acute findings. 2.  Tiny periumbilical hernia containing only fat. 3.  Mild hepatic cirrhosis.  No evidence of hepatic neoplasm or ascites.   Original Report Authenticated By: Danae Orleans, M.D.     Medications:  Scheduled Meds:    . antiseptic oral rinse  15 mL Mouth Rinse BID  . aspirin EC  81 mg Oral Daily  . budesonide-formoterol  2 puff Inhalation BID  . diazepam  10 mg Oral TID  . feeding supplement  30 mL Oral TID WC  . feeding supplement  1 Container Oral BID BM  . folic acid  1 mg Oral Daily  . lisinopril  20 mg Oral Daily  . metoCLOPramide  5 mg Oral TID AC  . metoprolol tartrate  50 mg Oral BID  . pantoprazole  40 mg Oral Daily  . PARoxetine  40 mg Oral Daily  . potassium chloride  40 mEq Oral Once  . sodium chloride  10-40 mL Intracatheter Q12H  . sodium chloride  3 mL Intravenous Q12H  . thiamine  100 mg Oral Daily  . tiotropium  18 mcg Inhalation Daily  . traZODone  50 mg Oral QHS  . DISCONTD: pantoprazole  40 mg Oral Daily  . DISCONTD: pantoprazole  80 mg Oral Daily   Continuous Infusions:  PRN Meds:.acetaminophen, hydrALAZINE, iohexol, LORazepam, promethazine, sodium chloride  Assessment/Plan: Wernick's SZ HTN. Labile but overall still high. Suggest increasing lisinopril.  Also see dictation.   LOS: 8 days   Donna [name]

## 2012-06-03 NOTE — Consult Note (Signed)
Donna [name], Donna [name]               ACCOUNT NO.:  [id number]  MEDICAL RECORD NO.:  [id number]  LOCATION:  A313                          FACILITY:  [facility]  PHYSICIAN:  Lionel December, M.D.    DATE OF BIRTH:  1955-12-23  DATE OF CONSULTATION:  06/02/2012 DATE OF DISCHARGE:                                CONSULTATION   REASON FOR CONSULTATION:  Persistent nausea, vomiting and abdominal pain.  HISTORY OF PRESENT ILLNESS:  The patient is a 56 year old Caucasian female who was admitted to [facility] on May 26, 2012, with nausea, vomiting, abdominal pain.  While in the hospital, she developed series of convulsions and transferred to ICU.  She was seen in consultation by Dr. [name] of Neurology Service.  MRI of her brain revealed changes felt to be consistent with Wernicke's encephalopathy. She also had EEG and no seizure activity was noted.  She has been treated with thiamine and multivitamin and improved.  She apparently also had hallucinations, but these have cleared.  The patient continues to have symptoms of nausea, vomiting, and abdominal pain.  Every time she tries to eat, she starts throwing up within few minutes.  She is vomiting bile.  She complains of generalized pain, but more so in epigastric region.  She states she has lost 20 pounds this year.  She says the heartburn is well controlled with pantoprazole.  She denies dysphagia.  She complains of diarrhea of 3 years duration.  She states the day she came to the hospital, she had 15 stools, but generally has 3- 4 stools.  She states her diarrhea has improved since she had cholecystectomy 5 months ago at [facility] in [place], [place].  She also reports single episode of hematochezia while she was hospitalized.  She has never undergone colonoscopy.  She denies using OTC, NSAIDs.  HOME MEDICATIONS: 1. Albuterol 2 puffs q.6 p.r.n. 2. Alprazolam 1 mg p.o. at bedtime p.r.n. 3. ASA 81 mg p.o.  daily. 4. Budesonide/formoterol 160/4.5 two puffs b.i.d. 5. Sertraline 10 mg p.o. daily. 6. Norco 5/325, one tablet q.6 p.r.n. she generally takes 2 a day. 7. DuoNeb unit dose b.i.d. 8. Pantoprazole 40 mg p.o. q.a.m. 9. Paroxetine 40 mg p.o. daily. 10.Spiriva 18 mcg 1 puff daily. 11.Alprazolam 1 mg p.o. at bedtime. 12.Mycostatin suspension 5 mL q.i.d.  CURRENT MEDICATIONS: 1. Aspirin 81 mg p.o. daily. 2. Budesonide/formoterol 2 puffs b.i.d. 3. Diazepam 10 mg p.o. t.i.d. 4. Folic acid 1 mg p.o. daily. 5. Lisinopril 20 mg p.o. daily. 6. Metoprolol tartrate 50 mg p.o. b.i.d. 7. Pantoprazole 40 mg p.o. daily. 8. Paroxetine 40 mg p.o. daily. 9. Thiamine 100 mg p.o. daily. 10.Spiriva 18 mcg inhalation daily. 11.Trazodone 50 mg p.o. at bedtime.  P.r.n. medications include acetaminophen, hydralazine, lorazepam, promethazine.  PAST MEDICAL HISTORY:  She has multiple medical problems.  History of drug dependence in the past, but she states not anymore.  She has chronic back pain which is constant.  Status post surgery at L1- S5 back in November 2003.  Depression and anxiety.  She has normal coronaries, but she had 2 episodes of elevated troponins felt to be due to mismatch or right heart strain, one  of these was in August 2013, when she was admitted to [facility] for respiratory failure and required ventilatory support.  She has peripheral neuropathy.  History of chronic hepatitis C diagnosed in December 2002.  She had grade 2 and stage II disease.  She was treated with antivirals by me and had SVR.  She presented with biliary colic in June 2005 and found to have small stone.  She had 2 ERCPs with sphincterotomy and removal of stone and debris.  She had cholecystectomy 5 months ago by Dr. Pamalee Leyden at [facility].  She had appendectomy at age 81.  She has COPD.  She was recently hospitalized with respiratory failure as above.  Seizure disorder.  This apparently was  diagnosed 3 years ago.  She has been intolerant of anticonvulsants.  She is now presumed to have Wernicke's encephalopathy.  An EEG during this admission was normal.  ALLERGIES:  To CODEINE, PENICILLIN, and SULFA.  FAMILY HISTORY:  Father committed suicide.  Mother died of lung carcinoma at age 63, but she also had colon carcinoma in her 79s.  One brother died at age 6.  She also had chronic hep C.  She has 2 sisters living, 1 has severe depression, other one has drug addiction.  SOCIAL HISTORY:  She is divorced.  She has son age 29 who is currently doing well.  She is now disabled.  She worked at [employer] for 11 years, worked at [employer] for 5 years and worked at [employer] for 5 years.  She used to drink a case of beer a day, but in the last 3-4 years she has had a can of beer every now and then.  She has been smoking for 40 years.  She was smoking 2 packs per day and now gradually cut it down to 2 cigarettes per day and trying to quit.  She lives in [place], [place].  PRIMARY CARE PHYSICIAN:  Wyvonnia Lora, MD and she gently see his PA, Ms. Annalee Genta.  OBJECTIVE:  VITAL SIGNS:  Admission weight 140 pounds, she is 62 inches tall.  Pulse 92 per minute, blood pressure 142/90, respiratory rate is 18, and temp is 98.1. HEENT:  Conjunctivae are pink.  Sclerae are nonicteric.  Oropharyngeal mucosa is normal.  She has 7 teeth left some in upper, some in her lower jaw. NECK:  No neck masses or thyromegaly noted. CARDIAC:  Regular rhythm.  Normal S1 and S2.  No murmur or gallop noted. LUNGS:  Auscultation of the lungs revealed vesicular breath sounds bilaterally.  No rales or rhonchi noted. ABDOMEN:  Symmetrical.  Bowel sounds are normal.  On palpation, soft abdomen with mild generalized tenderness, more so at epigastrium.  No organomegaly or masses. EXTREMITIES:  No peripheral edema or clubbing noted. NEUROLOGIC:  The patient is alert and responds  appropriate to questions.  LABORATORY DATA FROM ADMISSION:  WBC 5.7, H and H 15.7 and 47.8, platelet count 173 K.  Her lipase was 15.  Tox screen was positive for benzodiazepine and opiates.  Electrolytes pertinent for serum potassium of 2.9 and glucose of 127.  Comprehensive chemistry panel from May 29, 2012, pertinent for serum potassium of 3.4, glucose of 118, bilirubin was 0.7, AP 126, AST 32, ALT 26, albumin of 7.1 with albumin of 4.3, calcium was 10.0.  Lipase from yesterday morning was 26.  Acute abdominal series from yesterday revealed nonobstructive bowel gas pattern, evidence of prior cholecystectomy and Ray cage fusion L5 and S1.  MRI from May 27, 2012, reveals abnormal gray and white matter signal in a patchy quasi symmetrical pattern over the superior and posterior convexities and involving the medial thalami changes felt to be consistent with reversible encephalopathy syndrome.  ASSESSMENT:  The patient is a 56 year old Caucasian female with multiple medical problems, who was admitted week ago with nausea, vomiting and abdominal pain.  She also had acute on chronic diarrhea.  Her hospitalization has been complicated by seizures and on workup felt to have Wernicke's encephalopathy.  She remains on PPI, but she continues to experience nausea and vomiting and unable to keep food or pills down. She could have peptic ulcer disease, given that she has chronic obstructive pulmonary disease and she has been under lot of stress and also recent hospitalization for respiratory failure.  Her transaminases are normal.  History of chronic hepatitis C successfully treated 10 years ago.  I would like to document that HCV RNA is negative.  RECOMMENDATIONS:  Diagnostic esophagogastroduodenoscopy later today.  HCV RNA by PCR quantitative.   We appreciate the opportunity to participate in care of this nice lady.  Thank you were much.           ______________________________ Lionel December, M.D.     NR/MEDQ  D:  06/02/2012  T:  06/03/2012  Job:  [id number]

## 2012-06-03 NOTE — Progress Notes (Signed)
Physical Therapy Treatment Patient Details Name: Donna [name] MRN: [id number] DOB: 1956-03-07 Today's Date: 06/03/2012 Time: 1151-1205 PT Time Calculation (min): 14 min 1 GT  PT Assessment / Plan / Recommendation Comments on Treatment Session  Patient continues to be impulsive in regard to standing quickly with no notice, however no LOB today while doing this.Patient completed two rounds of gait training with a RW;supervison;no LOB for a total of 480'. Patient had visibly less energy second round.. During standing hip exercises patient was able to maintain balance wiithout any assistance from therapist.    Follow Up Recommendations       Barriers to Discharge        Equipment Recommendations       Recommendations for Other Services    Frequency     Plan      Precautions / Restrictions     Pertinent Vitals/Pain     Mobility  Bed Mobility Supine to Sit: 6: Modified independent (Device/Increase time) Sit to Supine: 6: Modified independent (Device/Increase time) Transfers Sit to Stand: 7: Independent Stand to Sit: 7: Independent Ambulation/Gait Ambulation/Gait Assistance: 5: Supervision Ambulation Distance (Feet): 480 Feet (300' + 180; seated rest break between) Assistive device: Rolling walker Ambulation/Gait Assistance Details: minimal scissoring present Gait Pattern: Narrow base of support;Trunk flexed Gait velocity: WNL General Gait Details: Gait is stable with RW Stairs: No Wheelchair Mobility Wheelchair Mobility: No    Exercises General Exercises - Lower Extremity Hip ABduction/ADduction: Standing;Both;10 reps (no assistance;no LOB) Hip Flexion/Marching: Standing;Both;10 reps (no assistance;no LOB)   PT Diagnosis:    PT Problem List:   PT Treatment Interventions:     PT Goals    Visit Information  Last PT Received On: 06/03/12    Subjective Data      Cognition       Balance     End of Session PT - End of Session Equipment Utilized During  Treatment: Gait belt Activity Tolerance: Patient tolerated treatment well Patient left: in bed;with call bell/phone within reach;with bed alarm set   GP     Alayssa Flinchum ATKINSO 06/03/2012, 12:15 PM

## 2012-06-03 NOTE — Progress Notes (Signed)
Subjective; Patient continues to complain of epigastric and constant back pain. She also continues to complain of postprandial nausea and vomiting. Vomiting occurs within 5 minutes meal. Patient states value and is making her nervous and is not helping. Objective; BP 168/109  Pulse 89  Temp 98.9 F (37.2 C) (Oral)  Resp 18  Ht 5\' 3"  (1.6 m)  Wt 140 lb 6.9 oz (63.7 kg)  BMI 24.88 kg/m2  SpO2 94% Patient appears very anxious. Abdomen is soft with mild generalized tenderness. No organomegaly or masses. Lab data; Serum potassium 3.3. HCVRNA is undetectable. Abdominopelvic CT reviewed. Mildly dilated common bile duct without stones. Tiny ventral hernia. Pneumobilia secondary to prior sphincterotomy. Also reviewed MRCP from April 2013 which was done at [facility] in [place], [place]. This study showed changes of pancreas divisum. Images reviewed with Dr.Stahl. Assessment; #1. Persistent symptoms of epigastric pain with nausea and vomiting. No significant abnormality noted on EGD or CT to account for persistent symptoms. MRCP from April 2013 shows changes of pancreas divisum. I doubt that this is an explanation for her symptoms. Her nausea and vomiting may be manifestation of extreme anxiety. #2. HCVRNA is negative implying SVR from earlier antiviral therapy. Recommendations; As discussed with Dr. Karilyn Cota, will discontinue metoclopramide and Valium and start on alprazolam 1 mg by mouth a.c.

## 2012-06-04 LAB — COMPREHENSIVE METABOLIC PANEL
ALT: 19 U/L (ref 0–35)
AST: 20 U/L (ref 0–37)
Calcium: 9.5 mg/dL (ref 8.4–10.5)
Creatinine, Ser: 0.68 mg/dL (ref 0.50–1.10)
GFR calc non Af Amer: >90 mL/min (ref >90)
Sodium: 137 mEq/L (ref 135–145)
Total Protein: 5.7 g/dL — ABNORMAL LOW (ref 6.0–8.3)

## 2012-06-04 MED ORDER — THIAMINE HCL 100 MG PO TABS: 100.0000 mg | ORAL_TABLET | Freq: Every day | ORAL | Status: AC

## 2012-06-04 MED ORDER — HYDROCODONE-ACETAMINOPHEN 5-325 MG PO TABS: 1.0000 | ORAL_TABLET | ORAL | Status: DC | PRN

## 2012-06-04 MED ORDER — ALPRAZOLAM 1 MG PO TABS: 1.0000 mg | ORAL_TABLET | Freq: Three times a day (TID) | ORAL | Status: AC

## 2012-06-04 MED ORDER — FOLIC ACID 1 MG PO TABS: 1.0000 mg | ORAL_TABLET | Freq: Every day | ORAL | Status: AC

## 2012-06-04 MED ORDER — LISINOPRIL 20 MG PO TABS: 20.0000 mg | ORAL_TABLET | Freq: Every day | ORAL | Status: AC

## 2012-06-04 MED ORDER — LISINOPRIL 10 MG PO TABS
20.0000 mg | ORAL_TABLET | Freq: Every day | ORAL | Status: DC
  Administered 2012-06-04: 20 mg via ORAL
  Filled 2012-06-04: qty 2

## 2012-06-04 MED ORDER — METOPROLOL TARTRATE 50 MG PO TABS: 50.0000 mg | ORAL_TABLET | Freq: Two times a day (BID) | ORAL | Status: DC

## 2012-06-04 NOTE — Progress Notes (Signed)
Pt is now within the norms of functional mobility.  No further PT is needed.  Will transfer gait over to nursing service.

## 2012-06-04 NOTE — Progress Notes (Signed)
PICC line removed RUA without difficulty, vaseline gauze and 4x4s applied, pressure held x 2 minutes, secured with hypafix tape, instructed patient to leave dressing in place x 24 hours and not to get the dressing wet, verbalized understanding.  Discharge instructions and prescriptions given, verbalized understanding, out in stable condition with staff.

## 2012-06-04 NOTE — Progress Notes (Signed)
UR Chart Review Completed  

## 2012-06-04 NOTE — Discharge Summary (Signed)
Physician Discharge Summary  Donna [name] ZOX:[id number] DOB: [birth year] DOA: 05/26/2012  PCP: Louie Boston, MD  Admit date: 05/26/2012 Discharge date: 06/04/2012  Recommendations for Outpatient Follow-up:  1. Follow with primary care physician.   Discharge Diagnoses:  1. Withdrawal seizure, alcohol versus benzodiazepines. 2. Wernicke's encephalopathy, clinically significantly improved. 3. Alcohol and benzodiazepine addiction. 4. Probable psychosomatic nausea and vomiting versus withdrawal symptoms. 5. Hypertension with labile blood pressure. 6. COPD, stable.   Discharge Condition: Stable and improved.  Diet recommendation: Regular.  Filed Weights   05/27/12 0509 05/27/12 1200 05/28/12 0421  Weight: 65.59 kg (144 lb 9.6 oz) 63.3 kg (139 lb 8.8 oz) 63.7 kg (140 lb 6.9 oz)    History of present illness:  This lady was admitted with symptoms initially of gastroenteritis but very soon afterwards developed seizures. Initially, the etiology of the seizures are not clear. However Dr. [name], who knows this lady well outside of the hospital, was convinced this was likely related to alcohol or benzodiazepine withdrawal. In fact, MRI brain scan was suggestive of Wernicke's encephalopathy.  Hospital Course:  Patient was stabilized with the use of intravenous benzodiazepines intravenous Dilantin for seizures. This improved her significantly. She was given intravenous thiamine and oral folic acid eventually. She is now on oral thiamine and folic acid. Her mentation improved significantly to the point where she is probably at her baseline despite her Wernicke's encephalopathy diagnosis initially. In the last 2 days she has had nausea and vomiting. She was seen by Dr. Karilyn Cota, gastroenterology, who investigated her with EGD and CT scan of the abdomen. These were largely unremarkable. The overall impression is that her symptoms and nausea and vomiting a likely psychosomatic versus withdrawal  symptoms. In fact she was started on Xanax 1 mg prior to eating yesterday and this is improved her symptoms significantly. She is keen to go home now.  Procedures:  EGD, Dr Karilyn Cota.  Consultations:  Gastroenterology, Dr Karilyn Cota.  Neurology, Dr [name].  Discharge Exam: Filed Vitals:   06/03/12 1400 06/03/12 2224 06/04/12 0536 06/04/12 0728  BP: 168/109 172/113 97/59   Pulse: 89 79 71   Temp: 98.9 F (37.2 C) 98.2 F (36.8 C) 98.3 F (36.8 C)   TempSrc:  Oral Oral   Resp: 18 18 18    Height:      Weight:      SpO2: 94% 94% 96% 95%    General: She looks systemically well. She is alert and orientated. She appears to be back at her baseline now. Cardiovascular: Heart sounds present and normal. No murmurs. Respiratory: Lung fields clear. Abdomen soft and nontender. No focal neurological signs.  Discharge Instructions  Discharge Orders    Future Appointments: Provider: Department: Dept Phone: Center:   07/10/2012 11:00 AM [facility] [phone] None   07/10/2012 12:00 PM Coralyn Helling, MD [facility] [phone] None     Future Orders Please Complete By Expires   Diet - low sodium heart healthy      Increase activity slowly          Medication List     As of 06/04/2012  8:11 AM    TAKE these medications         acetaminophen 500 MG tablet   Commonly known as: TYLENOL   Take 500 mg by mouth every 6 (six) hours as needed. pain      ALPRAZolam 1 MG tablet   Commonly known as: XANAX   Take 1 tablet (1 mg total) by  mouth 3 (three) times daily before meals.      aspirin EC 81 MG tablet   Take 1 tablet (81 mg total) by mouth daily.      budesonide-formoterol 160-4.5 MCG/ACT inhaler   Commonly known as: SYMBICORT   Inhale 2 puffs into the lungs 2 (two) times daily.      cetirizine 10 MG tablet   Commonly known as: ZYRTEC   Take 10 mg by mouth daily.      folic acid 1 MG tablet   Commonly known as: FOLVITE   Take 1 tablet  (1 mg total) by mouth daily.      HYDROcodone-acetaminophen 5-325 MG per tablet   Commonly known as: NORCO/VICODIN   Take 1 tablet by mouth every 4 (four) hours as needed for pain. pain      ipratropium-albuterol 0.5-2.5 (3) MG/3ML Soln   Commonly known as: DUONEB   Take 3 mLs by nebulization 2 (two) times daily.      lisinopril 20 MG tablet   Commonly known as: PRINIVIL,ZESTRIL   Take 1 tablet (20 mg total) by mouth daily.      metoprolol 50 MG tablet   Commonly known as: LOPRESSOR   Take 1 tablet (50 mg total) by mouth 2 (two) times daily.      nystatin 100000 UNIT/ML suspension   Commonly known as: MYCOSTATIN   Take 5 mLs by mouth 4 (four) times daily.      pantoprazole 40 MG tablet   Commonly known as: PROTONIX   Take 40 mg by mouth daily.      PARoxetine 20 MG tablet   Commonly known as: PAXIL   Take 40 mg by mouth daily.      PROAIR HFA 108 (90 BASE) MCG/ACT inhaler   Generic drug: albuterol   Inhale 2 puffs into the lungs every 6 (six) hours as needed. Shortness of breath      promethazine 25 MG suppository   Commonly known as: PHENERGAN   Place 1 suppository (25 mg total) rectally every 6 (six) hours as needed for nausea.      thiamine 100 MG tablet   Take 1 tablet (100 mg total) by mouth daily.      tiotropium 18 MCG inhalation capsule   Commonly known as: SPIRIVA   Place 1 capsule (18 mcg total) into inhaler and inhale daily.           Follow-up Information    Follow up with TAPPER,DAVID B, MD.   Contact information:   [address] [place] [address] [phone]           The results of significant diagnostics from this hospitalization (including imaging, microbiology, ancillary and laboratory) are listed below for reference.    Significant Diagnostic Studies: Dg Abd 1 View  06/01/2012  *RADIOLOGY REPORT*  Clinical Data: Vomiting, abdominal pain  ABDOMEN - 1 VIEW  Comparison: CT abdomen pelvis dated 04/07/2012  Findings: Nonobstructive  bowel gas pattern.  Cholecystectomy clips.  Ray cage fusion at L5-S1.  IMPRESSION: No evidence of bowel obstruction.   Original Report Authenticated By: Charline Bills, M.D.    Ct Abdomen W Contrast  06/03/2012  *RADIOLOGY REPORT*  Clinical Data: Abdominal pain.  Nausea and vomiting.  Chronic hepatitis C.  CT ABDOMEN WITH CONTRAST  Technique:  Multidetector CT imaging of the abdomen was performed following the standard protocol during bolus administration of intravenous contrast.  Contrast: OMNIPAQUE IOHEXOL 300 MG/ML  SOLN  Comparison: 04/07/2012  Findings: Surgical clips again seen from prior cholecystectomy. Biliary dilatation has resolved since previous study, and mild pneumobilia noted in the liver consistent with previous sphincterotomy.  Focal fatty infiltration seen adjacent the falciform ligament, but no liver masses are identified.  Mild nodularity of the capsular surface of the liver is seen vertically involving the left hepatic lobe, consistent with mild cirrhosis. No evidence of splenomegaly or ascites.  No evidence of pancreatic mass or pancreatic ductal dilatation.  No evidence of peripancreatic inflammatory changes or fluid collections.  The spleen, adrenal glands, and kidneys are normal in appearance.  No evidence of hydronephrosis.  No intra abdominal soft tissue masses or lymphadenopathy identified.  No evidence of inflammatory process or abnormal fluid collections.  A tiny periumbilical hernia is again seen containing only fat.  IMPRESSION:  1.  No acute findings. 2.  Tiny periumbilical hernia containing only fat. 3.  Mild hepatic cirrhosis.  No evidence of hepatic neoplasm or ascites.   Original Report Authenticated By: Danae Orleans, M.D.    Mr Brain Wo Contrast  05/27/2012  *RADIOLOGY REPORT*  Clinical Data: 56 year old female with new onset seizure, altered mental status. Blood pressure of 187/103 upon presentation to the ER.  MRI HEAD WITHOUT CONTRAST  Technique:  Multiplanar,  multiecho pulse sequences of the brain and surrounding structures were obtained according to standard protocol without intravenous contrast.  Comparison: Head CTs without contrast [facility] 11/27/2010 (no  images, report only) and 02/13/2010 (images present).  Findings: Study is intermittently degraded by motion artifact despite repeated imaging attempts.  No definite restricted diffusion to suggest acute infarction.  No ventriculomegaly. No midline shift, mass effect, or evidence of mass lesion.  No definite acute intracranial hemorrhage.  Negative pituitary, cervicomedullary junction and visualized cervical spine.  Major intracranial vascular flow voids are grossly preserved.  There is patchy FLAIR and possibly also at T2 hyperintensity in the cortex and subcortical white matter of the superior frontal gyri and parietal lobes in a somewhat symmetric distribution.  There is also evidence of left posterior temporal and occipital lobe involvement.  There is also evidence of abnormal FLAIR and T2 signal in the medial thalami (series 8 image 14).  Other deep gray matter nuclei appear within normal limits.  Brainstem and cerebellum appear grossly normal.  Normal marrow signal.  Grossly negative orbits, paranasal sinuses and mastoids.  IMPRESSION: 1.  Abnormal gray and white matter signal in a patchy quasi symmetric pattern over the superior and posterior convexities, and involving the medial thalami.  Favor posterior reversible encephalopathy syndrome. Less likely considerations include ADEM, and encephalitis due to viral or atypical etiology. 2.  Motion degraded exam despite repeated imaging attempts.   Original Report Authenticated By: Harley Hallmark, M.D.    Dg Chest Port 1 View  05/30/2012  *RADIOLOGY REPORT*  Clinical Data: PICC line placement  PORTABLE CHEST - 1 VIEW  Comparison: 05/26/2012  Findings: Right arm PICC terminates in the lower SVC, just above the cavoatrial junction.  Lungs are  essentially clear. No pleural effusion or pneumothorax.  Cardiomediastinal silhouette is within normal limits.  IMPRESSION: Right arm PICC terminates in the lower SVC.   Original Report Authenticated By: Charline Bills, M.D.    Dg Chest Portable 1 View  05/26/2012  *RADIOLOGY REPORT*  Clinical Data: Chest pain with nausea and vomiting  PORTABLE CHEST - 1 VIEW  Comparison: 04/24/2012  Findings: Cardiomegaly. COPD.  Clear lung fields.  Osteopenia. Improved aeration from priors.  IMPRESSION: Cardiomegaly, no  active disease.   Original Report Authenticated By: Elsie Stain, M.D.     Microbiology: Recent Results (from the past 240 hour(s))  MRSA PCR SCREENING     Status: Normal   Collection Time   05/27/12 12:00 PM      Component Value Range Status Comment   MRSA by PCR NEGATIVE  NEGATIVE Final      Labs: Basic Metabolic Panel:  Lab 06/04/12 4540 06/03/12 0530 06/01/12 1521 05/29/12 0518  NA 137 136 138 139  K 3.5 3.3* 3.2* 3.4*  CL 100 97 95* 98  CO2 29 29 30 27   GLUCOSE 92 113* 107* 118*  BUN 12 16 19 10   CREATININE 0.68 0.64 0.62 0.48*  CALCIUM 9.5 9.8 9.9 10.0  MG -- -- -- --  PHOS -- -- -- --   Liver Function Tests:  Lab 06/04/12 0552 06/01/12 1521 05/29/12 0518  AST 20 22 32  ALT 19 19 26   ALKPHOS 88 116 126*  BILITOT 0.5 0.4 0.7  PROT 5.7* 6.9 7.1  ALBUMIN 3.5 4.1 4.3    Lab 06/01/12 1521  LIPASE 26  AMYLASE --    CBC:  Lab 06/01/12 1521 05/29/12 0518  WBC 8.7 10.2  NEUTROABS -- --  HGB 16.7* 16.9*  HCT 48.2* 50.5*  MCV 93.4 94.0  PLT 162 152    BNP (last 3 results)  Basename 04/23/12 0327 04/18/12 1924  PROBNP 19352.0* 16764.0*      Time coordinating discharge: *Greater than 30 minutes  Signed:  GOSRANI,NIMISH C  [facility] 06/04/2012, 8:11 AM

## 2012-06-07 ENCOUNTER — Ambulatory Visit: Payer: Medicare Other | Admitting: Pulmonary Disease

## 2012-06-27 ENCOUNTER — Encounter (INDEPENDENT_AMBULATORY_CARE_PROVIDER_SITE_OTHER): Payer: Self-pay | Admitting: *Deleted

## 2012-07-01 HISTORY — PX: OTHER SURGICAL HISTORY: SHX169

## 2012-07-10 ENCOUNTER — Ambulatory Visit: Payer: Medicare Other | Admitting: Pulmonary Disease

## 2012-07-18 ENCOUNTER — Ambulatory Visit: Payer: Medicare Other | Admitting: Pulmonary Disease

## 2012-07-18 ENCOUNTER — Ambulatory Visit (INDEPENDENT_AMBULATORY_CARE_PROVIDER_SITE_OTHER): Payer: Medicare Other | Admitting: Psychiatry

## 2012-07-18 ENCOUNTER — Encounter (HOSPITAL_COMMUNITY): Payer: Self-pay | Admitting: Psychiatry

## 2012-07-18 VITALS — BP 100/70 | HR 76 | Ht 60.25 in | Wt 136.4 lb

## 2012-07-18 DIAGNOSIS — F411 Generalized anxiety disorder: Secondary | ICD-10-CM

## 2012-07-18 DIAGNOSIS — F1910 Other psychoactive substance abuse, uncomplicated: Secondary | ICD-10-CM

## 2012-07-18 DIAGNOSIS — G4700 Insomnia, unspecified: Secondary | ICD-10-CM

## 2012-07-18 DIAGNOSIS — E876 Hypokalemia: Secondary | ICD-10-CM

## 2012-07-18 DIAGNOSIS — F3160 Bipolar disorder, current episode mixed, unspecified: Secondary | ICD-10-CM

## 2012-07-18 DIAGNOSIS — F1010 Alcohol abuse, uncomplicated: Secondary | ICD-10-CM

## 2012-07-18 DIAGNOSIS — E512 Wernicke's encephalopathy: Secondary | ICD-10-CM

## 2012-07-18 DIAGNOSIS — F909 Attention-deficit hyperactivity disorder, unspecified type: Secondary | ICD-10-CM

## 2012-07-18 DIAGNOSIS — F329 Major depressive disorder, single episode, unspecified: Secondary | ICD-10-CM

## 2012-07-18 DIAGNOSIS — R569 Unspecified convulsions: Secondary | ICD-10-CM

## 2012-07-18 DIAGNOSIS — B1710 Acute hepatitis C without hepatic coma: Secondary | ICD-10-CM

## 2012-07-18 DIAGNOSIS — R52 Pain, unspecified: Secondary | ICD-10-CM

## 2012-07-18 DIAGNOSIS — F17200 Nicotine dependence, unspecified, uncomplicated: Secondary | ICD-10-CM

## 2012-07-18 MED ORDER — AMITRIPTYLINE HCL 25 MG PO TABS: 25.0000 mg | ORAL_TABLET | Freq: Every day | ORAL | Status: AC

## 2012-07-18 MED ORDER — CARBAMAZEPINE ER 200 MG PO TB12: ORAL_TABLET | ORAL | Status: DC

## 2012-07-18 MED ORDER — LIDOCAINE 5 % EX PTCH: 4.0000 | MEDICATED_PATCH | Freq: Two times a day (BID) | CUTANEOUS | Status: AC

## 2012-07-18 MED ORDER — MELOXICAM 7.5 MG PO TABS: 7.5000 mg | ORAL_TABLET | Freq: Two times a day (BID) | ORAL | Status: DC

## 2012-07-18 MED ORDER — CELECOXIB 100 MG PO CAPS: 100.0000 mg | ORAL_CAPSULE | Freq: Two times a day (BID) | ORAL | Status: AC

## 2012-07-18 MED ORDER — GABAPENTIN 100 MG PO CAPS: ORAL_CAPSULE | ORAL | Status: DC

## 2012-07-18 MED ORDER — DULOXETINE HCL 30 MG PO CPEP: 30.0000 mg | ORAL_CAPSULE | Freq: Two times a day (BID) | ORAL | Status: AC

## 2012-07-18 NOTE — Patient Instructions (Addendum)
Get back to AA meetings for the spiritual path and spiritual home  Strongly consider attending at least 6 [employer] Meetings to help you learn about how your helping others to the exclusion of helping yourself is actually hurting yourself and is actually an addiction to fixing others and that you need to work the 12 Step to Happiness through the [employer]. Al-Anon Family Groups could be helpful with how to deal with substance abusing family and friends. Or your own issues of being in victim role.  There are only 40 [employer] Family Group meetings a week here in [place].  Online are current listing of those meetings @ [email or web address]  There are [employer].  Search on line and there you can learn the format and can access the schedule for yourself.  Their number is [phone]  [employer] for [facility] by Wyn Quaker Schafe may be an interesting resource for you.
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
## 2012-07-30 ENCOUNTER — Ambulatory Visit (INDEPENDENT_AMBULATORY_CARE_PROVIDER_SITE_OTHER): Payer: Medicare Other | Admitting: Internal Medicine

## 2012-08-20 ENCOUNTER — Ambulatory Visit (HOSPITAL_COMMUNITY): Payer: Medicare Other | Admitting: Psychiatry

## 2012-08-20 ENCOUNTER — Inpatient Hospital Stay (HOSPITAL_COMMUNITY)
Admission: EM | Admit: 2012-08-20 | Discharge: 2012-09-10 | DRG: 917 | Disposition: A | Discharge status: Skilled Nursing Facility | Payer: Medicare Other | Attending: Emergency Medicine | Admitting: Emergency Medicine

## 2012-08-20 ENCOUNTER — Encounter (HOSPITAL_COMMUNITY): Payer: Self-pay | Admitting: Emergency Medicine

## 2012-08-20 ENCOUNTER — Emergency Department (HOSPITAL_COMMUNITY): Payer: Medicare Other

## 2012-08-20 DIAGNOSIS — J449 Chronic obstructive pulmonary disease, unspecified: Secondary | ICD-10-CM

## 2012-08-20 DIAGNOSIS — E871 Hypo-osmolality and hyponatremia: Secondary | ICD-10-CM | POA: Diagnosis present

## 2012-08-20 DIAGNOSIS — I2109 ST elevation (STEMI) myocardial infarction involving other coronary artery of anterior wall: Secondary | ICD-10-CM | POA: Diagnosis present

## 2012-08-20 DIAGNOSIS — R569 Unspecified convulsions: Secondary | ICD-10-CM

## 2012-08-20 DIAGNOSIS — F4310 Post-traumatic stress disorder, unspecified: Secondary | ICD-10-CM | POA: Diagnosis present

## 2012-08-20 DIAGNOSIS — R079 Chest pain, unspecified: Secondary | ICD-10-CM

## 2012-08-20 DIAGNOSIS — T400X1A Poisoning by opium, accidental (unintentional), initial encounter: Secondary | ICD-10-CM | POA: Diagnosis present

## 2012-08-20 DIAGNOSIS — Z87891 Personal history of nicotine dependence: Secondary | ICD-10-CM

## 2012-08-20 DIAGNOSIS — F3289 Other specified depressive episodes: Secondary | ICD-10-CM

## 2012-08-20 DIAGNOSIS — R339 Retention of urine, unspecified: Secondary | ICD-10-CM

## 2012-08-20 DIAGNOSIS — G40901 Epilepsy, unspecified, not intractable, with status epilepticus: Secondary | ICD-10-CM

## 2012-08-20 DIAGNOSIS — E512 Wernicke's encephalopathy: Secondary | ICD-10-CM

## 2012-08-20 DIAGNOSIS — G9340 Encephalopathy, unspecified: Secondary | ICD-10-CM | POA: Diagnosis present

## 2012-08-20 DIAGNOSIS — I69959 Hemiplegia and hemiparesis following unspecified cerebrovascular disease affecting unspecified side: Secondary | ICD-10-CM

## 2012-08-20 DIAGNOSIS — I214 Non-ST elevation (NSTEMI) myocardial infarction: Secondary | ICD-10-CM

## 2012-08-20 DIAGNOSIS — G4700 Insomnia, unspecified: Secondary | ICD-10-CM

## 2012-08-20 DIAGNOSIS — F17200 Nicotine dependence, unspecified, uncomplicated: Secondary | ICD-10-CM

## 2012-08-20 DIAGNOSIS — E860 Dehydration: Secondary | ICD-10-CM

## 2012-08-20 DIAGNOSIS — J189 Pneumonia, unspecified organism: Secondary | ICD-10-CM

## 2012-08-20 DIAGNOSIS — K5900 Constipation, unspecified: Secondary | ICD-10-CM | POA: Diagnosis not present

## 2012-08-20 DIAGNOSIS — F1410 Cocaine abuse, uncomplicated: Secondary | ICD-10-CM

## 2012-08-20 DIAGNOSIS — G40401 Other generalized epilepsy and epileptic syndromes, not intractable, with status epilepticus: Secondary | ICD-10-CM | POA: Diagnosis present

## 2012-08-20 DIAGNOSIS — E876 Hypokalemia: Secondary | ICD-10-CM

## 2012-08-20 DIAGNOSIS — F1910 Other psychoactive substance abuse, uncomplicated: Secondary | ICD-10-CM

## 2012-08-20 DIAGNOSIS — F1020 Alcohol dependence, uncomplicated: Secondary | ICD-10-CM | POA: Diagnosis present

## 2012-08-20 DIAGNOSIS — T405X1A Poisoning by cocaine, accidental (unintentional), initial encounter: Principal | ICD-10-CM | POA: Diagnosis present

## 2012-08-20 DIAGNOSIS — F1010 Alcohol abuse, uncomplicated: Secondary | ICD-10-CM

## 2012-08-20 DIAGNOSIS — J9690 Respiratory failure, unspecified, unspecified whether with hypoxia or hypercapnia: Secondary | ICD-10-CM

## 2012-08-20 DIAGNOSIS — K219 Gastro-esophageal reflux disease without esophagitis: Secondary | ICD-10-CM

## 2012-08-20 DIAGNOSIS — Z79899 Other long term (current) drug therapy: Secondary | ICD-10-CM

## 2012-08-20 DIAGNOSIS — J690 Pneumonitis due to inhalation of food and vomit: Secondary | ICD-10-CM

## 2012-08-20 DIAGNOSIS — I509 Heart failure, unspecified: Secondary | ICD-10-CM | POA: Diagnosis present

## 2012-08-20 DIAGNOSIS — B1710 Acute hepatitis C without hepatic coma: Secondary | ICD-10-CM

## 2012-08-20 DIAGNOSIS — N179 Acute kidney failure, unspecified: Secondary | ICD-10-CM

## 2012-08-20 DIAGNOSIS — E519 Thiamine deficiency, unspecified: Secondary | ICD-10-CM | POA: Diagnosis present

## 2012-08-20 DIAGNOSIS — M329 Systemic lupus erythematosus, unspecified: Secondary | ICD-10-CM | POA: Diagnosis present

## 2012-08-20 DIAGNOSIS — J4489 Other specified chronic obstructive pulmonary disease: Secondary | ICD-10-CM | POA: Diagnosis present

## 2012-08-20 DIAGNOSIS — E870 Hyperosmolality and hypernatremia: Secondary | ICD-10-CM

## 2012-08-20 DIAGNOSIS — R112 Nausea with vomiting, unspecified: Secondary | ICD-10-CM

## 2012-08-20 DIAGNOSIS — T40601A Poisoning by unspecified narcotics, accidental (unintentional), initial encounter: Secondary | ICD-10-CM | POA: Diagnosis present

## 2012-08-20 DIAGNOSIS — I252 Old myocardial infarction: Secondary | ICD-10-CM

## 2012-08-20 DIAGNOSIS — I2510 Atherosclerotic heart disease of native coronary artery without angina pectoris: Secondary | ICD-10-CM | POA: Diagnosis present

## 2012-08-20 DIAGNOSIS — I5032 Chronic diastolic (congestive) heart failure: Secondary | ICD-10-CM

## 2012-08-20 DIAGNOSIS — J9600 Acute respiratory failure, unspecified whether with hypoxia or hypercapnia: Secondary | ICD-10-CM

## 2012-08-20 DIAGNOSIS — K7030 Alcoholic cirrhosis of liver without ascites: Secondary | ICD-10-CM

## 2012-08-20 DIAGNOSIS — R52 Pain, unspecified: Secondary | ICD-10-CM

## 2012-08-20 DIAGNOSIS — J301 Allergic rhinitis due to pollen: Secondary | ICD-10-CM

## 2012-08-20 DIAGNOSIS — J811 Chronic pulmonary edema: Secondary | ICD-10-CM

## 2012-08-20 DIAGNOSIS — D696 Thrombocytopenia, unspecified: Secondary | ICD-10-CM

## 2012-08-20 DIAGNOSIS — M6282 Rhabdomyolysis: Secondary | ICD-10-CM

## 2012-08-20 DIAGNOSIS — A419 Sepsis, unspecified organism: Secondary | ICD-10-CM

## 2012-08-20 DIAGNOSIS — Z7982 Long term (current) use of aspirin: Secondary | ICD-10-CM

## 2012-08-20 DIAGNOSIS — I213 ST elevation (STEMI) myocardial infarction of unspecified site: Secondary | ICD-10-CM

## 2012-08-20 DIAGNOSIS — R1310 Dysphagia, unspecified: Secondary | ICD-10-CM

## 2012-08-20 DIAGNOSIS — J441 Chronic obstructive pulmonary disease with (acute) exacerbation: Secondary | ICD-10-CM

## 2012-08-20 DIAGNOSIS — J9601 Acute respiratory failure with hypoxia: Secondary | ICD-10-CM

## 2012-08-20 DIAGNOSIS — F429 Obsessive-compulsive disorder, unspecified: Secondary | ICD-10-CM | POA: Diagnosis present

## 2012-08-20 DIAGNOSIS — I10 Essential (primary) hypertension: Secondary | ICD-10-CM

## 2012-08-20 DIAGNOSIS — F329 Major depressive disorder, single episode, unspecified: Secondary | ICD-10-CM

## 2012-08-20 DIAGNOSIS — T43601A Poisoning by unspecified psychostimulants, accidental (unintentional), initial encounter: Secondary | ICD-10-CM | POA: Diagnosis present

## 2012-08-20 DIAGNOSIS — F411 Generalized anxiety disorder: Secondary | ICD-10-CM | POA: Diagnosis present

## 2012-08-20 DIAGNOSIS — I219 Acute myocardial infarction, unspecified: Secondary | ICD-10-CM

## 2012-08-20 DIAGNOSIS — B1920 Unspecified viral hepatitis C without hepatic coma: Secondary | ICD-10-CM | POA: Diagnosis present

## 2012-08-20 HISTORY — DX: Other psychoactive substance abuse, uncomplicated: F19.10

## 2012-08-20 LAB — CBC WITH DIFFERENTIAL/PLATELET
Basophils Absolute: 0 10*3/uL (ref 0.0–0.1)
Basophils Relative: 0 % (ref 0–1)
Eosinophils Absolute: 0 10*3/uL (ref 0.0–0.7)
MCH: 32.2 pg (ref 26.0–34.0)
MCHC: 34.5 g/dL (ref 30.0–36.0)
Monocytes Relative: 5 % (ref 3–12)
Neutro Abs: 13.1 10*3/uL — ABNORMAL HIGH (ref 1.7–7.7)
Neutrophils Relative %: 83 % — ABNORMAL HIGH (ref 43–77)
RDW: 13 % (ref 11.5–15.5)

## 2012-08-20 LAB — ACETAMINOPHEN LEVEL: Acetaminophen (Tylenol), Serum: <15 ug/mL (ref 10–30)

## 2012-08-20 LAB — BLOOD GAS, ARTERIAL
Bicarbonate: 23.2 mEq/L (ref 20.0–24.0)
Bicarbonate: 21.7 mEq/L (ref 20.0–24.0)
FIO2: 1 %
O2 Saturation: 88.5 %
PEEP: 8 cmH2O
Patient temperature: 37
Patient temperature: 101.1
TCO2: 19.5 mmol/L (ref 0–100)
TCO2: 23 mmol/L (ref 0–100)
pH, Arterial: 7.323 — ABNORMAL LOW (ref 7.350–7.450)
pO2, Arterial: 62.3 mmHg — ABNORMAL LOW (ref 80.0–100.0)
pO2, Arterial: 144 mmHg — ABNORMAL HIGH (ref 80.0–100.0)

## 2012-08-20 LAB — RAPID URINE DRUG SCREEN, HOSP PERFORMED
Amphetamines: NOT DETECTED
Benzodiazepines: POSITIVE — AB
Cocaine: POSITIVE — AB
Opiates: NOT DETECTED

## 2012-08-20 LAB — URINALYSIS, ROUTINE W REFLEX MICROSCOPIC
Leukocytes, UA: NEGATIVE
Specific Gravity, Urine: >1.03 — ABNORMAL HIGH (ref 1.005–1.030)
Urobilinogen, UA: 0.2 mg/dL (ref 0.0–1.0)

## 2012-08-20 LAB — CK TOTAL AND CKMB (NOT AT ARMC): Total CK: 3339 U/L — ABNORMAL HIGH (ref 7–177)

## 2012-08-20 LAB — BASIC METABOLIC PANEL
Calcium: 8.5 mg/dL (ref 8.4–10.5)
Creatinine, Ser: 1.22 mg/dL — ABNORMAL HIGH (ref 0.50–1.10)
GFR calc Af Amer: 56 mL/min — ABNORMAL LOW (ref >90)
GFR calc non Af Amer: 48 mL/min — ABNORMAL LOW (ref >90)
Sodium: 149 mEq/L — ABNORMAL HIGH (ref 135–145)

## 2012-08-20 LAB — HEPARIN LEVEL (UNFRACTIONATED): Heparin Unfractionated: 0.87 IU/mL — ABNORMAL HIGH (ref 0.30–0.70)

## 2012-08-20 LAB — CBC
Hemoglobin: 17.7 g/dL — ABNORMAL HIGH (ref 12.0–15.0)
MCH: 32.1 pg (ref 26.0–34.0)
MCV: 95.1 fL (ref 78.0–100.0)
Platelets: 163 10*3/uL (ref 150–400)
RBC: 5.51 MIL/uL — ABNORMAL HIGH (ref 3.87–5.11)

## 2012-08-20 LAB — COMPREHENSIVE METABOLIC PANEL
BUN: 33 mg/dL — ABNORMAL HIGH (ref 6–23)
Calcium: 9.4 mg/dL (ref 8.4–10.5)
GFR calc Af Amer: 30 mL/min — ABNORMAL LOW (ref >90)
Glucose, Bld: 116 mg/dL — ABNORMAL HIGH (ref 70–99)
Total Protein: 7 g/dL (ref 6.0–8.3)

## 2012-08-20 LAB — LIPASE, BLOOD: Lipase: 8 U/L — ABNORMAL LOW (ref 11–59)

## 2012-08-20 LAB — CARBAMAZEPINE LEVEL, TOTAL: Carbamazepine Lvl: <4 ug/mL — ABNORMAL LOW (ref 4.0–12.0)

## 2012-08-20 LAB — URINE MICROSCOPIC-ADD ON

## 2012-08-20 LAB — DIFFERENTIAL
Eosinophils Absolute: 0 10*3/uL (ref 0.0–0.7)
Eosinophils Relative: 0 % (ref 0–5)
Lymphs Abs: 1.1 10*3/uL (ref 0.7–4.0)
Monocytes Relative: 9 % (ref 3–12)

## 2012-08-20 LAB — PHOSPHORUS: Phosphorus: 2.8 mg/dL (ref 2.3–4.6)

## 2012-08-20 LAB — TYPE AND SCREEN: Antibody Screen: NEGATIVE

## 2012-08-20 LAB — PROCALCITONIN: Procalcitonin: 1.29 ng/mL

## 2012-08-20 LAB — ETHANOL: Alcohol, Ethyl (B): <11 mg/dL (ref 0–11)

## 2012-08-20 LAB — PROTIME-INR: Prothrombin Time: 14.3 seconds (ref 11.6–15.2)

## 2012-08-20 LAB — SALICYLATE LEVEL: Salicylate Lvl: <2 mg/dL — ABNORMAL LOW (ref 2.8–20.0)

## 2012-08-20 LAB — LACTIC ACID, PLASMA: Lactic Acid, Venous: 1.6 mmol/L (ref 0.5–2.2)

## 2012-08-20 LAB — MAGNESIUM: Magnesium: 1.2 mg/dL — ABNORMAL LOW (ref 1.5–2.5)

## 2012-08-20 MED ORDER — THIAMINE HCL 100 MG/ML IJ SOLN
100.0000 mg | Freq: Every day | INTRAMUSCULAR | Status: DC
  Administered 2012-08-20 – 2012-08-22 (×3): 100 mg via INTRAVENOUS
  Filled 2012-08-20 (×4): qty 1

## 2012-08-20 MED ORDER — ACETAMINOPHEN 160 MG/5ML PO SOLN
650.0000 mg | Freq: Four times a day (QID) | ORAL | Status: DC | PRN
  Administered 2012-08-20 – 2012-08-29 (×4): 650 mg
  Filled 2012-08-20 (×4): qty 20.3

## 2012-08-20 MED ORDER — SODIUM CHLORIDE 0.9 % IV SOLN: 750.0000 mg | INTRAVENOUS | Status: DC

## 2012-08-20 MED ORDER — CARBAMAZEPINE 100 MG/5ML PO SUSP
400.0000 mg | Freq: Three times a day (TID) | ORAL | Status: DC
  Administered 2012-08-20 – 2012-08-27 (×18): 400 mg
  Filled 2012-08-20 (×27): qty 20

## 2012-08-20 MED ORDER — CHLORHEXIDINE GLUCONATE 0.12 % MT SOLN
15.0000 mL | Freq: Two times a day (BID) | OROMUCOSAL | Status: DC
  Administered 2012-08-20 – 2012-08-30 (×16): 15 mL via OROMUCOSAL
  Filled 2012-08-20 (×20): qty 15

## 2012-08-20 MED ORDER — SODIUM CHLORIDE 0.9 % IV BOLUS (SEPSIS)
1000.0000 mL | Freq: Once | INTRAVENOUS | Status: AC
  Administered 2012-08-20: 1000 mL via INTRAVENOUS

## 2012-08-20 MED ORDER — MIDAZOLAM HCL 2 MG/2ML IJ SOLN
INTRAMUSCULAR | Status: AC
  Administered 2012-08-20: 2 mg via INTRAVENOUS
  Filled 2012-08-20: qty 2

## 2012-08-20 MED ORDER — MAGNESIUM SULFATE 40 MG/ML IJ SOLN
4.0000 g | Freq: Once | INTRAMUSCULAR | Status: AC
  Administered 2012-08-20: 4 g via INTRAVENOUS
  Filled 2012-08-20: qty 100

## 2012-08-20 MED ORDER — FENTANYL CITRATE 0.05 MG/ML IJ SOLN
INTRAMUSCULAR | Status: AC
  Filled 2012-08-20: qty 2

## 2012-08-20 MED ORDER — SUCCINYLCHOLINE CHLORIDE 20 MG/ML IJ SOLN
INTRAMUSCULAR | Status: AC | PRN
  Administered 2012-08-20: 150 mg via INTRAVENOUS

## 2012-08-20 MED ORDER — POTASSIUM CHLORIDE 20 MEQ/15ML (10%) PO LIQD
40.0000 meq | Freq: Once | ORAL | Status: AC
  Administered 2012-08-20: 40 meq
  Filled 2012-08-20: qty 30

## 2012-08-20 MED ORDER — HEPARIN (PORCINE) IN NACL 100-0.45 UNIT/ML-% IJ SOLN
INTRAMUSCULAR | Status: AC
  Administered 2012-08-20: 1000 [IU]/h via INTRAVENOUS
  Filled 2012-08-20: qty 250

## 2012-08-20 MED ORDER — ASPIRIN 325 MG PO TABS
325.0000 mg | ORAL_TABLET | Freq: Every day | ORAL | Status: DC
  Administered 2012-08-21 – 2012-09-10 (×20): 325 mg via ORAL
  Filled 2012-08-20 (×21): qty 1

## 2012-08-20 MED ORDER — PANTOPRAZOLE SODIUM 40 MG IV SOLR
40.0000 mg | INTRAVENOUS | Status: DC
  Administered 2012-08-20 – 2012-08-23 (×4): 40 mg via INTRAVENOUS
  Filled 2012-08-20 (×5): qty 40

## 2012-08-20 MED ORDER — BIOTENE DRY MOUTH MT LIQD
15.0000 mL | Freq: Four times a day (QID) | OROMUCOSAL | Status: DC
  Administered 2012-08-21 – 2012-08-30 (×31): 15 mL via OROMUCOSAL

## 2012-08-20 MED ORDER — FOLIC ACID 1 MG PO TABS
1.0000 mg | ORAL_TABLET | Freq: Every day | ORAL | Status: DC
  Administered 2012-08-20 – 2012-08-23 (×4): 1 mg via ORAL
  Filled 2012-08-20 (×5): qty 1

## 2012-08-20 MED ORDER — SODIUM CHLORIDE 0.9 % IV SOLN
3.0000 g | Freq: Three times a day (TID) | INTRAVENOUS | Status: DC
  Administered 2012-08-20 – 2012-08-24 (×11): 3 g via INTRAVENOUS
  Filled 2012-08-20 (×13): qty 3

## 2012-08-20 MED ORDER — SODIUM CHLORIDE 0.45 % IV SOLN
INTRAVENOUS | Status: DC
  Administered 2012-08-20: 150 mL/h via INTRAVENOUS
  Administered 2012-08-21 (×2): via INTRAVENOUS

## 2012-08-20 MED ORDER — FOLIC ACID 5 MG/ML IJ SOLN: 1.0000 mg | Freq: Every day | INTRAMUSCULAR | Status: DC

## 2012-08-20 MED ORDER — SODIUM CHLORIDE 0.9 % IV SOLN
1000.0000 mg | Freq: Once | INTRAVENOUS | Status: AC
  Administered 2012-08-20: 1000 mg via INTRAVENOUS
  Filled 2012-08-20: qty 10

## 2012-08-20 MED ORDER — LORAZEPAM 2 MG/ML IJ SOLN
INTRAMUSCULAR | Status: AC | PRN
  Administered 2012-08-20: 2 mg via INTRAVENOUS

## 2012-08-20 MED ORDER — HEPARIN (PORCINE) IN NACL 100-0.45 UNIT/ML-% IJ SOLN
1050.0000 [IU]/h | INTRAMUSCULAR | Status: DC
  Administered 2012-08-20 (×2): 1000 [IU]/h via INTRAVENOUS
  Filled 2012-08-20 (×2): qty 250

## 2012-08-20 MED ORDER — LORAZEPAM 2 MG/ML IJ SOLN
1.0000 mg | Freq: Once | INTRAMUSCULAR | Status: AC
  Administered 2012-08-20: 1 mg via INTRAVENOUS
  Filled 2012-08-20: qty 1

## 2012-08-20 MED ORDER — SODIUM CHLORIDE 0.9 % IV SOLN
25.0000 ug/h | INTRAVENOUS | Status: DC
  Administered 2012-08-20: 50 ug/h via INTRAVENOUS
  Filled 2012-08-20: qty 50

## 2012-08-20 MED ORDER — SODIUM CHLORIDE 0.9 % IV SOLN
1000.0000 mg | Freq: Two times a day (BID) | INTRAVENOUS | Status: DC
  Administered 2012-08-21 – 2012-08-24 (×7): 1000 mg via INTRAVENOUS
  Filled 2012-08-20 (×8): qty 10

## 2012-08-20 MED ORDER — POTASSIUM CHLORIDE 10 MEQ/100ML IV SOLN: 10.0000 meq | INTRAVENOUS | Status: DC

## 2012-08-20 MED ORDER — IPRATROPIUM-ALBUTEROL 18-103 MCG/ACT IN AERO: 6.0000 | INHALATION_SPRAY | RESPIRATORY_TRACT | Status: DC | PRN

## 2012-08-20 MED ORDER — MIDAZOLAM HCL 2 MG/2ML IJ SOLN
2.0000 mg | Freq: Once | INTRAMUSCULAR | Status: AC
  Administered 2012-08-20: 2 mg via INTRAVENOUS

## 2012-08-20 MED ORDER — ASPIRIN 300 MG RE SUPP
300.0000 mg | Freq: Once | RECTAL | Status: AC
  Administered 2012-08-20: 300 mg via RECTAL
  Filled 2012-08-20: qty 1

## 2012-08-20 MED ORDER — FENTANYL BOLUS VIA INFUSION
25.0000 ug | Freq: Four times a day (QID) | INTRAVENOUS | Status: DC | PRN
  Administered 2012-08-20: 100 ug via INTRAVENOUS
  Filled 2012-08-20: qty 100

## 2012-08-20 MED ORDER — FENTANYL CITRATE 0.05 MG/ML IJ SOLN
50.0000 ug | Freq: Once | INTRAMUSCULAR | Status: AC
  Administered 2012-08-20: 50 ug via INTRAVENOUS

## 2012-08-20 MED ORDER — CHLORHEXIDINE GLUCONATE 0.12 % MT SOLN
OROMUCOSAL | Status: AC
  Administered 2012-08-20: 15 mL via OROMUCOSAL
  Filled 2012-08-20: qty 15

## 2012-08-20 MED ORDER — ASPIRIN 81 MG PO CHEW: 324.0000 mg | CHEWABLE_TABLET | Freq: Once | ORAL | Status: DC

## 2012-08-20 MED ORDER — SODIUM CHLORIDE 0.9 % IV SOLN: 250.0000 mL | INTRAVENOUS | Status: DC | PRN

## 2012-08-20 MED ORDER — IPRATROPIUM-ALBUTEROL 18-103 MCG/ACT IN AERO
6.0000 | INHALATION_SPRAY | RESPIRATORY_TRACT | Status: DC
  Administered 2012-08-20 – 2012-08-24 (×19): 6 via RESPIRATORY_TRACT
  Filled 2012-08-20 (×2): qty 14.7

## 2012-08-20 MED ORDER — MIDAZOLAM HCL 2 MG/2ML IJ SOLN
2.0000 mg | INTRAMUSCULAR | Status: DC | PRN
  Administered 2012-08-22 (×2): 2 mg via INTRAVENOUS
  Administered 2012-08-22 (×4): 4 mg via INTRAVENOUS
  Administered 2012-08-23: 2 mg via INTRAVENOUS
  Administered 2012-08-23: 4 mg via INTRAVENOUS
  Administered 2012-08-23 – 2012-08-24 (×4): 2 mg via INTRAVENOUS
  Filled 2012-08-20: qty 2
  Filled 2012-08-20: qty 4
  Filled 2012-08-20: qty 2
  Filled 2012-08-20 (×2): qty 4
  Filled 2012-08-20: qty 2
  Filled 2012-08-20: qty 4
  Filled 2012-08-20 (×4): qty 2
  Filled 2012-08-20: qty 4
  Filled 2012-08-20 (×3): qty 2

## 2012-08-20 MED ORDER — LEVOFLOXACIN IN D5W 750 MG/150ML IV SOLN
750.0000 mg | Freq: Once | INTRAVENOUS | Status: AC
  Administered 2012-08-20: 750 mg via INTRAVENOUS
  Filled 2012-08-20: qty 150

## 2012-08-20 MED ORDER — ETOMIDATE 2 MG/ML IV SOLN
INTRAVENOUS | Status: AC | PRN
  Administered 2012-08-20: 20 mg via INTRAVENOUS

## 2012-08-20 MED ORDER — SODIUM CHLORIDE 0.9 % IV SOLN
2.0000 mg/h | INTRAVENOUS | Status: DC
  Administered 2012-08-20: 2 mg/h via INTRAVENOUS
  Filled 2012-08-20: qty 10

## 2012-08-20 MED ORDER — HEPARIN BOLUS VIA INFUSION
4000.0000 [IU] | Freq: Once | INTRAVENOUS | Status: AC
  Administered 2012-08-20: 4000 [IU] via INTRAVENOUS

## 2012-08-20 MED ORDER — VANCOMYCIN HCL IN DEXTROSE 1-5 GM/200ML-% IV SOLN
1000.0000 mg | Freq: Once | INTRAVENOUS | Status: AC
  Administered 2012-08-20: 1000 mg via INTRAVENOUS
  Filled 2012-08-20: qty 200

## 2012-08-20 MED ORDER — MAGNESIUM SULFATE 40 MG/ML IJ SOLN
4.0000 g | Freq: Once | INTRAMUSCULAR | Status: DC
  Filled 2012-08-20: qty 100

## 2012-08-20 NOTE — Progress Notes (Signed)
CRITICAL VALUE ALERT  Critical value received:  K+ 2.6   Date of notification:  08/20/2012  Time of notification:  8:29 PM  Critical value read back:yes  Nurse who received alert:  Vassie Moselle  MD notified (1st page):  Dr. Vassie Loll  Time of first page:  8:29 PM  MD notified (2nd page):  Time of second page:  Responding MD: Dr. Vassie Loll  Time MD responded: 8:30 PM

## 2012-08-20 NOTE — Progress Notes (Addendum)
ANTICOAGULATION CONSULT NOTE - Follow Up Consult  Pharmacy Consult for Heparin Indication: chest pain/ACS  Allergies  Allergen Reactions  . Corn-Containing Products Shortness Of Breath and Swelling    REACTION: affects breathing  . Eggs Or Egg-Derived Products Shortness Of Breath  . Latex Shortness Of Breath, Itching and Rash  . Codeine Itching and Nausea And Vomiting  . Penicillins Other (See Comments)    REACTION: "thrush"  . Sulfa Antibiotics Other (See Comments)    REACTION: Unknown    Patient Measurements: Heparin Dosing Weight: 62  Vital Signs: Temp: 101.4 F (38.6 C) (12/10 2000) Temp src: Other (Comment) (12/10 1618) BP: 96/53 mmHg (12/10 2000) Pulse Rate: 124  (12/10 2000)  Labs:  Basename 08/20/12 1926 08/20/12 1359  HGB 16.8* 17.7*  HCT 48.7* 52.4*  PLT 128* 163  APTT -- 30  LABPROT -- 14.3  INR -- 1.13  HEPARINUNFRC 0.87* --  CREATININE 1.22* 2.06*  CKTOTAL -- 3339*  CKMB -- 60.5*  TROPONINI -- 13.95*    The CrCl is unknown because both a height and weight (above a minimum accepted value) are required for this calculation.  Assessment: 56 year old female with STEMI in setting of cocaine use.  Heparin level = 0.87 (supratherapeutic - drawn early)  With renal insufficiency  Goal of Therapy:  Heparin level 0.3-0.7 units/ml Monitor platelets by anticoagulation protocol: Yes   Plan:  1) Decrease heparin to 900 units / hr 2) Follow up AM heparin level, CBC  Thank you. Okey Regal, PharmD [phone]  08/20/2012,9:05 PM

## 2012-08-20 NOTE — ED Notes (Signed)
Lab called CKMB 60.45 and Troponin 13.95

## 2012-08-20 NOTE — H&P (Signed)
PULMONARY  / CRITICAL CARE MEDICINE  Name: Donna [name] MRN: [id number] DOB: 12/27/1955    LOS: 0  REFERRING MD:  Transfer from [facility]  CHIEF COMPLAINT:  Unresponsive  BRIEF PATIENT DESCRIPTION: 56 yo found unresponsive next to empty Oxychodone bottle.  Seizure en route.  Intubated for airway protection.  Drug screen positive for cocaine.  STEMI, seen by Cardiology, not a candidate for emergent cardiac catheterization.  LINES / TUBES: 12/10  OETT 12/10  OGT 12/10  Foley  CULTURES: 12/10  Blood >>> 12/10  Urine >>>  ANTIBIOTICS: Levaquin 12/10 x 1 Vancomycin 12/10 x 1 Unasyn 12/10 >>>  SIGNIFICANT EVENTS:  12/10  Admitted with cocaine induced seizure / STEMI.  Intubated for airway protection.  LEVEL OF CARE:  ICU  PRIMARY SERVICE:  PCCM  CONSULTANTS:  Cardiology  CODE STATUS:  Full  DIET:  NPO  DVT Px:  Not indicated (on Heparin gtt)  GI Px:  Protonix  The patient is encephalopathic and unable to provide history, which was obtained for available medical records.  HISTORY OF PRESENT ILLNESS:  56 yo found unresponsive next to empty Oxychodone bottle.  Seizure en route.  Intubated for airway protection.  Drug screen positive for cocaine.  STEMI, seen by Cardiology, not a candidate for emergent cardiac catheterization.  PAST MEDICAL HISTORY :  Past Medical History  Diagnosis Date  . Coronary artery disease   . Degenerative disc disease   . Asthma   . Lupus   . Hepatitis C   . Heart attack   . Anxiety   . Obsessive-compulsive disorder   . Depression   . PTSD (post-traumatic stress disorder)   . Seizures    Past Surgical History  Procedure Date  . Cholecystectomy   . Appendectomy   . Tonsillectomy   . Tubal ligation   . Polyps on vocal cords   . Cardiac catheterization   . Esophagogastroduodenoscopy 06/02/2012    Procedure: ESOPHAGOGASTRODUODENOSCOPY (EGD);  Surgeon: Malissa Hippo, MD;  Location: [facility];  Service: Endoscopy;  Laterality: N/A;   . 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 Authorizing Provider  acetaminophen (TYLENOL) 500 MG tablet Take 500 mg by mouth every 6 (six) hours as needed. pain    Historical Provider, MD  albuterol (PROAIR HFA) 108 (90 BASE) MCG/ACT inhaler Inhale 2 puffs into the lungs every 6 (six) hours as needed. Shortness of breath    Historical Provider, MD  ALPRAZolam (XANAX) 1 MG tablet Take 1 tablet (1 mg total) by mouth 3 (three) times daily before meals. 06/04/12   Nimish Normajean Glasgow, MD  amitriptyline (ELAVIL) 25 MG tablet Take 1 tablet (25 mg total) by mouth at bedtime. 07/18/12   Mike Craze, MD  aspirin EC 81 MG tablet Take 1 tablet (81 mg total) by mouth daily. 05/09/12 05/09/13  Prescott Parma, PA  budesonide-formoterol (SYMBICORT) 160-4.5 MCG/ACT inhaler Inhale 2 puffs into the lungs 2 (two) times daily. 04/26/12 04/26/13  Zannie Cove, MD  carbamazepine (TEGRETOL-XR) 200 MG 12 hr tablet Start with one the first night, may add a second if not asleep in 1 hour. Then 2 the next night and may add the third dose if not asleep in one hour.  Stay on 3 at night. For seizures and brain damage 07/18/12   Mike Craze, MD  celecoxib (CELEBREX) 100 MG capsule Take 1 capsule (100 mg total) by mouth 2 (two)  times daily. 07/18/12 07/18/13  Mike Craze, MD  cetirizine (ZYRTEC) 10 MG tablet Take 10 mg by mouth daily.    Historical Provider, MD  DULoxetine (CYMBALTA) 30 MG capsule Take 1 capsule (30 mg total) by mouth 2 (two) times daily. 07/18/12   Mike Craze, MD  folic acid (FOLVITE) 1 MG tablet Take 1 tablet (1 mg total) by mouth daily. 06/04/12   Nimish Normajean Glasgow, MD  gabapentin (NEURONTIN) 100 MG capsule Take by mouth FOUR times a day start with one for each dose, then increase to two or three for each dose.  It may cause initial drunk feeling. Eventually will be on 900 mg four times a day. 07/18/12   Mike Craze, MD   ipratropium-albuterol (DUONEB) 0.5-2.5 (3) MG/3ML SOLN Take 3 mLs by nebulization 2 (two) times daily.    Historical Provider, MD  lidocaine (LIDODERM) 5 % Place 4 patches onto the skin every 12 (twelve) hours. Remove & Discard patch within 12 hours or as directed by MD place over pain sites. USE tape on all four sides 07/18/12 07/18/13  Mike Craze, MD  lisinopril (PRINIVIL,ZESTRIL) 20 MG tablet Take 1 tablet (20 mg total) by mouth daily. 06/04/12   Nimish Normajean Glasgow, MD  meloxicam (MOBIC) 7.5 MG tablet Take 1 tablet (7.5 mg total) by mouth 2 (two) times daily. 07/18/12   Mike Craze, MD  nystatin (MYCOSTATIN) 100000 UNIT/ML suspension Take 5 mLs by mouth 4 (four) times daily.    Historical Provider, MD  pantoprazole (PROTONIX) 40 MG tablet Take 40 mg by mouth daily.    Historical Provider, MD  promethazine (PHENERGAN) 25 MG suppository Place 1 suppository (25 mg total) rectally every 6 (six) hours as needed for nausea. 05/27/12 06/03/12  Ward Givens, MD  thiamine 100 MG tablet Take 1 tablet (100 mg total) by mouth daily. 06/04/12   Nimish Normajean Glasgow, MD  tiotropium (SPIRIVA HANDIHALER) 18 MCG inhalation capsule Place 1 capsule (18 mcg total) into inhaler and inhale daily. 04/26/12 04/26/13  Zannie Cove, MD   Allergies  Allergen Reactions  . Corn-Containing Products Shortness Of Breath and Swelling    REACTION: affects breathing  . Eggs Or Egg-Derived Products Shortness Of Breath  . Latex Shortness Of Breath, Itching and Rash  . Codeine Itching and Nausea And Vomiting  . Penicillins Other (See Comments)    REACTION: "thrush"  . Sulfa Antibiotics Other (See Comments)    REACTION: Unknown   FAMILY HISTORY:  Family History  Problem Relation Age of Onset  . Depression Mother   . Alcohol abuse Father   . ADD / ADHD Sister   . Drug abuse Sister   . Anxiety disorder Sister   . OCD Sister   . ADD / ADHD Brother   . Alcohol abuse Brother   . Drug abuse Brother   . Anxiety disorder Brother    . ADD / ADHD Sister   . Drug abuse Sister   . Anxiety disorder Sister   . Paranoid behavior Sister   . ADD / ADHD Son    SOCIAL HISTORY:  reports that she quit smoking about 2 months ago. Her smoking use included Cigarettes. She has a 4.4 pack-year smoking history. She quit smokeless tobacco use about 2 months ago. She reports that she does not drink alcohol or use illicit drugs.  REVIEW OF SYSTEMS:  Unable to provide.  INTERVAL HISTORY:  VITAL SIGNS: Temp:  [99.7 F (37.6 C)-101.9 F (38.8 C)]  101 F (38.3 C) (12/10 1900) Pulse Rate:  [107-148] 118  (12/10 1900) Resp:  [16-28] 21  (12/10 1900) BP: (79-134)/(59-88) 93/68 mmHg (12/10 1900) SpO2:  [91 %-100 %] 99 % (12/10 1900) FiO2 (%):  [100 %] 100 % (12/10 1734)  HEMODYNAMICS:    VENTILATOR SETTINGS: Vent Mode:  [-] PRVC FiO2 (%):  [100 %] 100 % Set Rate:  [14 bmp-15 bmp] 15 bmp Vt Set:  [500 mL] 500 mL PEEP:  [7 cmH20-8 cmH20] 8 cmH20 Plateau Pressure:  [24 cmH20] 24 cmH20  INTAKE / OUTPUT: Intake/Output      12/10 0701 - 12/11 0700   I.V. 170.3   IV Piggyback 100   Total Intake 270.3   Urine 110   Total Output 110   Net +160.3         PHYSICAL EXAMINATION: General:  Appears acutely ill, mechanically ventilated, synchronous Neuro:  Encephalopathic, nonfocal, cough / gag diminished HEENT:  PERRL, OETT / OGT Cardiovascular:  RRR, tachycardic Lungs:  Bilateral diminished air entry, no w/r/r Abdomen:  Soft, nontender, bowel sounds diminished Musculoskeletal:  Moves all extremities, no edema Skin:  Intact  LABS:  Lab 08/20/12 1929 08/20/12 1926 08/20/12 1418 08/20/12 1359 08/20/12 1356  HGB -- 16.8* -- 17.7* --  WBC -- 15.7* -- 18.2* --  PLT -- 128* -- 163 --  NA -- -- -- 151* --  K -- -- -- 3.0* --  CL -- -- -- 108 --  CO2 -- -- -- 25 --  GLUCOSE -- -- -- 116* --  BUN -- -- -- 33* --  CREATININE -- -- -- 2.06* --  CALCIUM -- -- -- 9.4 --  MG -- -- -- -- --  PHOS -- -- -- -- --  AST -- -- --  215* --  ALT -- -- -- 78* --  ALKPHOS -- -- -- 73 --  BILITOT -- -- -- 0.4 --  PROT -- -- -- 7.0 --  ALBUMIN -- -- -- 4.0 --  APTT -- -- -- 30 --  INR -- -- -- 1.13 --  LATICACIDVEN -- -- 1.6 -- --  TROPONINI -- -- -- 13.95* --  PROCALCITON -- -- -- -- --  PROBNP -- -- -- -- --  O2SATVEN -- -- -- -- --  PHART 7.323* -- -- -- 7.342*  PCO2ART 44.0 -- -- -- 43.9  PO2ART 144.0* -- -- -- 62.3*    Lab 08/20/12 1749  GLUCAP 164*   IMAGING: 12/10  CXR >>> Hardware in good position, RUL airspace disease 12/10  Head CT >>> nad  ECG: 12/10  >>> Sinus tachycardia, ST elevations in anterolateral and inferior leads  DIAGNOSES: Active Problems:  Acute respiratory failure  Hypokalemia  COPD (chronic obstructive pulmonary disease)  Acute encephalopathy  Status epilepticus  Acute renal failure  Aspiration pneumonia  STEMI (ST elevation myocardial infarction)  Thrombocytopenia  Hypernatremia  Alcohol abuse  ASSESSMENT / PLAN:  PULMONARY  A:  Acute respiratory failure due to inability to protect airway.  Aspiration pneumonia.  COPD without exacerbation. P:   Gaol SpO2>92, pH>7.30 Full mechanical support Daily SBT Trend ABG / CXR Combivent Hold Symbicort / Spiriva  CARDIOVASCULAR  A: STEMI in setting of Cocaine use.  Hypotension. P:  Seen by Cardiology - cardiac cath is not indicated Trend troponin Heparin gtt ASA Beta-blocker / ACEI contraindicated - hypotension Statin contraindicated - elevated transaminases   RENAL  A:  Acute renal failure.  Hypernatremia.  Hypokalemia. P:   Trend  BMP 1/2 NS@150  K 20 Check Mg  GASTROINTESTINAL  A:  Elevated transaminases - alcoholic pattern.  Hep C. P:   NPO as intubated TF if remains intubated > 24 hours  HEMATOLOGIC  A:  Thrombocytopenia, alcohol related? P:  Trend CBC  INFECTIOUS  A:  Aspiration pneumonia. P:   Antibiotics as above PCT  ENDOCRINE   A:  No active issues.  At risk for hypoglycemia. P:    CBG  NEUROLOGIC  A:  Seizure disorder.  Status epilepticus in setting of Cocaine use.  Suspected alcohol abuse.  Acute encephalopathy.  Anxiety / depression / PTSD / OCD. P:   Goal RASS 0 to -1 Fentanyl gtt Versed PRN Tegretol continued Keppra added Thiamine / Folate Hold Xanax, Elavil, Cymbalta, Neurontin  CLINICAL SUMMARY: 56 yo found unresponsive next to empty Oxychodone bottle.  Seizure en route.  Intubated for airway protection.  Drug screen positive for cocaine.  STEMI, seen by Cardiology, not a candidate for emergent cardiac catheterization - Heparin / Aspirin.  Empirical treatment for aspiration pneumonia.  I have personally obtained a history, examined the patient, evaluated laboratory and imaging results, formulated the assessment and plan and placed orders.  CRITICAL CARE:  The patient is critically ill with multiple organ systems failure and requires high complexity decision making for assessment and support, frequent evaluation and titration of therapies, application of advanced monitoring technologies and extensive interpretation of multiple databases. Critical Care Time devoted to patient care services described in this note is 45 minutes.   Lonia Farber, MD  Pulmonary and Critical Care Medicine [facility] Pager: [phone]  08/20/2012, 7:59 PM

## 2012-08-20 NOTE — ED Notes (Signed)
Pt was found by neighbors unresponsive with bottle of hydrocodone nearby. Pt put on CPAP via ems and O2 stayed at 76. 22g IV in left hand. Pt has hx of lupus.

## 2012-08-20 NOTE — ED Notes (Signed)
Patient has lower extremity posturing. EDP aware.

## 2012-08-20 NOTE — Progress Notes (Signed)
eLink Physician-Brief Progress Note Patient Name: Donna [name] DOB: Jan 15, 1956 MRN: [id number]  Date of Service  08/20/2012   HPI/Events of Note     eICU Interventions  Hypokalemia -repleted    Intervention Category Intermediate Interventions: Electrolyte abnormality - evaluation and management  ALVA,RAKESH V. 08/20/2012, 8:33 PM

## 2012-08-20 NOTE — ED Notes (Signed)
CRITICAL VALUE ALERT  Critical value received:  Troponin 13.95, CKMB 60.5  Date of notification:  08/20/12  Time of notification:  1515  Critical value read back:yes  Nurse who received alert:  Chandra Batch RN  MD notified (1st page):  Dr Bebe Shaggy   Time of first page: 1515  MD notified (2nd page):  Time of second page:  Responding MD:  Dr Bebe Shaggy  Time MD responded:  [phone]

## 2012-08-20 NOTE — Progress Notes (Signed)
Called Dr. Vassie Loll about K+ 3.0 as it has not been corrected. Vassie Loll wants to wait for repeat labs.

## 2012-08-20 NOTE — Progress Notes (Signed)
eLink Physician-Brief Progress Note Patient Name: AMORINA DOERR DOB: Feb 05, 1956 MRN: [id number]  Date of Service  08/20/2012   HPI/Events of Note     eICU Interventions  Admission orders written ASA/ heparin per cards unasyn for aspiration    Intervention Category Major Interventions: Respiratory failure - evaluation and management  ALVA,RAKESH V. 08/20/2012, 6:02 PM

## 2012-08-20 NOTE — Progress Notes (Addendum)
ANTIBIOTIC CONSULT NOTE - INITIAL  Pharmacy Consult for Vancomycin / Unasyn Indication: rule out pneumonia  Allergies  Allergen Reactions  . Corn-Containing Products Shortness Of Breath and Swelling    REACTION: affects breathing  . Eggs Or Egg-Derived Products Shortness Of Breath  . Latex Shortness Of Breath, Itching and Rash  . Codeine Itching and Nausea And Vomiting  . Penicillins Other (See Comments)    REACTION: "thrush"  . Sulfa Antibiotics Other (See Comments)    REACTION: Unknown   Labs:  Basename 08/20/12 1359  WBC 18.2*  HGB 17.7*  PLT 163  LABCREA --  CREATININE 2.06*   The CrCl is unknown because both a height and weight (above a minimum accepted value) are required for this calculation. No results found for this basename: VANCOTROUGH:2,VANCOPEAK:2,VANCORANDOM:2,GENTTROUGH:2,GENTPEAK:2,GENTRANDOM:2,TOBRATROUGH:2,TOBRAPEAK:2,TOBRARND:2,AMIKACINPEAK:2,AMIKACINTROU:2,AMIKACIN:2, in the last 72 hours   Microbiology: No results found for this or any previous visit (from the past 720 hour(s)).  Medical History: Past Medical History  Diagnosis Date  . Coronary artery disease   . Degenerative disc disease   . Asthma   . Lupus   . Hepatitis C   . Heart attack   . Anxiety   . Obsessive-compulsive disorder   . Depression   . PTSD (post-traumatic stress disorder)   . Seizures     Medications:  Scheduled:    . [COMPLETED] aspirin  300 mg Rectal Once  . [COMPLETED] fentaNYL  50 mcg Intravenous Once  . [COMPLETED] LORazepam  1 mg Intravenous Once  . [COMPLETED] midazolam  2 mg Intravenous Once  . [DISCONTINUED] aspirin  324 mg Oral Once   Assessment: 56 yo F found unresponsive by neighbors & thought to have aspirated medications.  She was intubated upon arrival to ED.  Given recent hospital admission she will be treated for HCAP.   She is currently febrile with elevated WBC and acute renal impairment.    Goal of Therapy:  Vancomycin trough level 15-20  mcg/ml  Plan:  1) Vancomycin 1gm IV x1 now in [facility] ED, then 750mg  IV Q24h 2) Check Vancomycin trough at steady state 3) Monitor renal function and cx data  4) Unasyn 3 grams iv Q 8 hours  Thank you. Okey Regal, PharmD [phone]

## 2012-08-20 NOTE — ED Notes (Signed)
Pt presents unresponsive on CPAP with O2 of 76. Blood glucose 148. Dr. Bebe Shaggy in room to begin intubation. 2mg  of ativan given at 1337. 20mh of etomidate given at 1343. 150mg  of succinycholine given at 1344. Intubation began at 1345. 21 at the lip with good color change at 1346. OG successfully placed at 1347.

## 2012-08-20 NOTE — ED Provider Notes (Signed)
History    This chart was scribed for Joya Gaskins, MD, MD by Smitty Pluck, ED Scribe. The patient was seen in room APA02 and the patient's care was started at 1:33PM.   CSN: [id number]  Arrival date & time 08/20/12  1338   None     Chief Complaint  Patient presents with  . Respiratory Distress     The history is provided by the EMS personnel. No language interpreter was used.  Pt is level 5 caveat Donna [name] is a 56 y.o. female who presents to the Emergency Department BIB EMS due to being found unresponsive by neighbors. Per EMS neighbors report pt normally chews her medication pills and when they found her she appeared to have aspirated those pills. Per EMS CBG was 245. Per EMS pt was placed on CPAP and O2 was 76% in route. Pt has hx of Lupus, CAD, heart attack, seizures and Hep C.  EMS reports she may have had a seizure en route No other details are known about this patient at time of arrival  Past Medical History  Diagnosis Date  . Coronary artery disease   . Degenerative disc disease   . Asthma   . Lupus   . Hepatitis C   . Heart attack   . Anxiety   . Obsessive-compulsive disorder   . Depression   . PTSD (post-traumatic stress disorder)   . Seizures     Past Surgical History  Procedure Date  . Cholecystectomy   . Appendectomy   . Tonsillectomy   . Tubal ligation   . Polyps on vocal cords   . Cardiac catheterization   . Esophagogastroduodenoscopy 06/02/2012    Procedure: ESOPHAGOGASTRODUODENOSCOPY (EGD);  Surgeon: Malissa Hippo, MD;  Location: [facility];  Service: Endoscopy;  Laterality: N/A;  . 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 Depression Mother   . Alcohol abuse Father   . ADD / ADHD Sister   . Drug abuse Sister   . Anxiety disorder Sister   . OCD Sister   . ADD / ADHD Brother   . Alcohol abuse Brother   . Drug abuse Brother   . Anxiety disorder Brother   . ADD / ADHD Sister    . Drug abuse Sister   . Anxiety disorder Sister   . Paranoid behavior Sister   . ADD / ADHD Son     History  Substance Use Topics  . Smoking status: Former Smoker -- 0.1 packs/day for 44 years    Types: Cigarettes    Quit date: 05/27/2012  . Smokeless tobacco: Former Neurosurgeon    Quit date: 05/27/2012     Comment: using e-cig also, cutting back  . Alcohol Use: No     Comment: occ    OB History    Grav Para Term Preterm Abortions TAB SAB Ect Mult Living                  Review of Systems  Unable to perform ROS: Mental status change  unresponsive  Allergies  Corn-containing products; Eggs or egg-derived products; Latex; Codeine; Penicillins; and Sulfa antibiotics  Home Medications   Current Outpatient Rx  Name  Route  Sig  Dispense  Refill  . ACETAMINOPHEN 500 MG PO TABS   Oral   Take 500 mg by mouth every 6 (six) hours as needed. pain         . ALBUTEROL SULFATE  HFA 108 (90 BASE) MCG/ACT IN AERS   Inhalation   Inhale 2 puffs into the lungs every 6 (six) hours as needed. Shortness of breath         . ALPRAZOLAM 1 MG PO TABS   Oral   Take 1 tablet (1 mg total) by mouth 3 (three) times daily before meals.   30 tablet   0   . AMITRIPTYLINE HCL 25 MG PO TABS   Oral   Take 1 tablet (25 mg total) by mouth at bedtime.   30 tablet   1   . ASPIRIN EC 81 MG PO TBEC   Oral   Take 1 tablet (81 mg total) by mouth daily.         . BUDESONIDE-FORMOTEROL FUMARATE 160-4.5 MCG/ACT IN AERO   Inhalation   Inhale 2 puffs into the lungs 2 (two) times daily.   1 Inhaler   12   . CARBAMAZEPINE ER 200 MG PO TB12      Start with one the first night, may add a second if not asleep in 1 hour. Then 2 the next night and may add the third dose if not asleep in one hour.  Stay on 3 at night. For seizures and brain damage   90 tablet   1   . CELECOXIB 100 MG PO CAPS   Oral   Take 1 capsule (100 mg total) by mouth 2 (two) times daily.   60 capsule   2     Prefer Celebrex  if okay for her, but only one cele ...   . CETIRIZINE HCL 10 MG PO TABS   Oral   Take 10 mg by mouth daily.         . DULOXETINE HCL 30 MG PO CPEP   Oral   Take 1 capsule (30 mg total) by mouth 2 (two) times daily.   60 capsule   1   . FOLIC ACID 1 MG PO TABS   Oral   Take 1 tablet (1 mg total) by mouth daily.   30 tablet   0   . GABAPENTIN 100 MG PO CAPS      Take by mouth FOUR times a day start with one for each dose, then increase to two or three for each dose.  It may cause initial drunk feeling. Eventually will be on 900 mg four times a day.   240 capsule   2   . IPRATROPIUM-ALBUTEROL 0.5-2.5 (3) MG/3ML IN SOLN   Nebulization   Take 3 mLs by nebulization 2 (two) times daily.         Marland Kitchen LIDOCAINE 5 % EX PTCH   Transdermal   Place 4 patches onto the skin every 12 (twelve) hours. Remove & Discard patch within 12 hours or as directed by MD place over pain sites. USE tape on all four sides   10 patch   0   . LISINOPRIL 20 MG PO TABS   Oral   Take 1 tablet (20 mg total) by mouth daily.   30 tablet   0   . MELOXICAM 7.5 MG PO TABS   Oral   Take 1 tablet (7.5 mg total) by mouth 2 (two) times daily.   60 tablet   1   . NYSTATIN 100000 UNIT/ML MT SUSP   Oral   Take 5 mLs by mouth 4 (four) times daily.         Marland Kitchen PANTOPRAZOLE SODIUM 40 MG PO TBEC  Oral   Take 40 mg by mouth daily.         Marland Kitchen PROMETHAZINE HCL 25 MG RE SUPP   Rectal   Place 1 suppository (25 mg total) rectally every 6 (six) hours as needed for nausea.   12 each   0   . THIAMINE HCL 100 MG PO TABS   Oral   Take 1 tablet (100 mg total) by mouth daily.   30 tablet   0   . TIOTROPIUM BROMIDE MONOHYDRATE 18 MCG IN CAPS   Inhalation   Place 1 capsule (18 mcg total) into inhaler and inhale daily.   30 capsule   12     BP 79/59  Pulse 108  SpO2 93% BP 125/75  Pulse 107  Temp 101.1 F (38.4 C)  Resp 18  SpO2 95%   Physical Exam  Nursing note and vitals  reviewed. CONSTITUTIONAL: ill appearing, toxic HEAD AND FACE: Normocephalic/atraumatic EYES: PERRL ENMT: Mucous membranes dry.  Poor dentition.  White paste noted in mouth.   CPAP mask in place on arrival (removed immediately) NECK: supple  CV: tachycardic, no loud murmurs LUNGS:tachypneic, poor air entry bilatearlly ABDOMEN: soft  GU: normal appearance, chaperone present NEURO: Pt is somnolent minimal response to voice and pain.  She has brief generalized tonic clonic seizure on arrival EXTREMITIES: pulses normal,no deformity SKIN: warm PSYCH: somnolent  ED Course  OG placement Performed by: Joya Gaskins Authorized by: Joya Gaskins Consent: The procedure was performed in an emergent situation. Patient sedated: yes Patient tolerance: Patient tolerated the procedure well with no immediate complications. Comments: OG tube placed by myself immediately after intubation.  Pt tolerated well and confirmed by xray    INTUBATION Performed by: Joya Gaskins  Required items: required blood products, implants, devices, and special equipment available Patient identity confirmed: provided demographic data and hospital-assigned identification number Time out: Immediately prior to procedure a "time out" was called to verify the correct patient, procedure, equipment, support staff and site/side marked as required.  Indications: seizure, hypoxia, distress  Intubation method: Glidescope Laryngoscopy   Preoxygenation: BVM  Sedatives: Etomidate Paralytic: Succinylcholine  Tube Size: 7.5 cuffed  Post-procedure assessment: chest rise and ETCO2 monitor Breath sounds: equal and absent over the epigastrium Tube secured with: ETT holder Chest x-ray interpreted by radiologist and me.   Patient tolerated the procedure well with no immediate complications.    DIAGNOSTIC STUDIES: Oxygen Saturation is 93% on CPAP, adequate by my interpretation (initially was hypoxic)     COORDINATION OF CARE: CRITICAL CARE Performed by: Joya Gaskins, MD,   Total critical care time: 69  Critical care time was exclusive of separately billable procedures and treating other patients.  Critical care was necessary to treat or prevent imminent or life-threatening deterioration.  Critical care was time spent personally by me on the following activities: development of treatment plan with patient and/or surrogate as well as nursing, discussions with consultants, evaluation of patient's response to treatment, examination of patient, obtaining history from patient or surrogate, ordering and performing treatments and interventions, ordering and review of laboratory studies, ordering and review of radiographic studies, pulse oximetry and re-evaluation of patient's condition.  2:00 PM Pt ill appearing on arrival.  She had brief seizure (given ativan) but was minimally responsive.  She was also initially hypoxic (70s) on arrival.  Pt intubated without difficulty Will follow closely 2:14 PM ekg shows anterior STEMI Unclear cause of altered mental status Per chart, normal cardiac catheterization in 2011  Will call cardiologist for review but will not activate code stemi due to AMS, seizure, and she is febrile 2:20 PM Dr Jens Som to review EKG and call back 2:29 PM Dr Jens Som reviewed ekg.  Given the multitude of other medical issues, recommends ct head, iv fluids, antibiotics and admission to critical care and would not activate cath lab.  Will repeat ekg Will also treat for pneumonia (healthcare associated, she had admission in september) 3:23 PM Given labs/ekg, likely acute mi She also appears to be seizing again, will ativan and start versed drip Will call ICU [facility] for [facility] 3:58 PM D/w dr Vassie Loll, will accept at [facility] Will start heparin/asa He recommends keppra load with versed drip for seizures SBP >100 O2 sats in low 90s D/w family, they report pt has not been  taking care of herself and may be using drugs (cocaine positive on UDS) No further posturing noted on my exam    MDM  Nursing notes including past medical history and social history reviewed and considered in documentation xrays reviewed and considered Previous records reviewed and considered - h/o withdrawal seizure and lupus per chart Labs/vital reviewed and considered      Date: 08/20/2012  Rate: 117  Rhythm: sinus tachycardia  QRS Axis: normal  Intervals: normal  ST/T Wave abnormalities: ST elevations anteriorly  Conduction Disutrbances:none  Narrative Interpretation:   Old EKG Reviewed: changes noted    Date: 08/20/2012 1427  Rate: 106  Rhythm: sinus tachycardia  QRS Axis: normal  Intervals: normal  ST/T Wave abnormalities: ST elevations anteriorly  Conduction Disutrbances:none  Narrative Interpretation:   Old EKG Reviewed: unchanged     I personally performed the services described in this documentation, which was scribed in my presence. The recorded information has been reviewed and is accurate.      Joya Gaskins, MD 08/20/12 1600

## 2012-08-20 NOTE — Progress Notes (Signed)
Called lab to inquire about troponin result. Troponin had been drawn by phlebotomy but has not resulted. Phlebotomy will redraw.

## 2012-08-21 DIAGNOSIS — I219 Acute myocardial infarction, unspecified: Secondary | ICD-10-CM

## 2012-08-21 DIAGNOSIS — J189 Pneumonia, unspecified organism: Secondary | ICD-10-CM

## 2012-08-21 LAB — GLUCOSE, CAPILLARY
Glucose-Capillary: 129 mg/dL — ABNORMAL HIGH (ref 70–99)
Glucose-Capillary: 113 mg/dL — ABNORMAL HIGH (ref 70–99)
Glucose-Capillary: 103 mg/dL — ABNORMAL HIGH (ref 70–99)

## 2012-08-21 LAB — URINE CULTURE

## 2012-08-21 LAB — BLOOD GAS, ARTERIAL
Acid-base deficit: 5.1 mmol/L — ABNORMAL HIGH (ref 0.0–2.0)
Drawn by: 34779
FIO2: 0.6 %
MECHVT: 500 mL
O2 Saturation: 95.1 %
Patient temperature: 99.9
RATE: 15 resp/min
TCO2: 20.4 mmol/L (ref 0–100)

## 2012-08-21 LAB — PROCALCITONIN: Procalcitonin: 1.94 ng/mL

## 2012-08-21 LAB — BASIC METABOLIC PANEL
CO2: 18 mEq/L — ABNORMAL LOW (ref 19–32)
Chloride: 111 mEq/L (ref 96–112)
GFR calc Af Amer: 43 mL/min — ABNORMAL LOW (ref >90)
Potassium: 3.5 mEq/L (ref 3.5–5.1)

## 2012-08-21 LAB — CBC
HCT: 48.4 % — ABNORMAL HIGH (ref 36.0–46.0)
Hemoglobin: 16.3 g/dL — ABNORMAL HIGH (ref 12.0–15.0)
MCH: 31.8 pg (ref 26.0–34.0)
MCHC: 33.7 g/dL (ref 30.0–36.0)
RDW: 13.3 % (ref 11.5–15.5)

## 2012-08-21 LAB — TROPONIN I
Troponin I: 7.47 ng/mL (ref <0.30)
Troponin I: 7.21 ng/mL (ref <0.30)
Troponin I: 3.86 ng/mL (ref <0.30)

## 2012-08-21 LAB — HEPARIN LEVEL (UNFRACTIONATED): Heparin Unfractionated: 0.27 IU/mL — ABNORMAL LOW (ref 0.30–0.70)

## 2012-08-21 LAB — OSMOLALITY: Osmolality: 322 mOsm/kg — ABNORMAL HIGH (ref 275–300)

## 2012-08-21 MED ORDER — SODIUM CHLORIDE 0.9 % IV BOLUS (SEPSIS)
500.0000 mL | Freq: Once | INTRAVENOUS | Status: AC
  Administered 2012-08-21: 500 mL via INTRAVENOUS

## 2012-08-21 MED ORDER — HEPARIN (PORCINE) IN NACL 100-0.45 UNIT/ML-% IJ SOLN
1500.0000 [IU]/h | INTRAMUSCULAR | Status: DC
  Administered 2012-08-21: 1050 [IU]/h via INTRAVENOUS
  Administered 2012-08-23: 1500 [IU]/h via INTRAVENOUS
  Filled 2012-08-21 (×6): qty 250

## 2012-08-21 MED ORDER — FENTANYL CITRATE 0.05 MG/ML IJ SOLN
50.0000 ug | INTRAMUSCULAR | Status: DC | PRN
  Administered 2012-08-22 – 2012-08-23 (×2): 50 ug via INTRAVENOUS
  Administered 2012-08-23: 100 ug via INTRAVENOUS
  Administered 2012-08-24: 50 ug via INTRAVENOUS
  Administered 2012-08-24: 100 ug via INTRAVENOUS
  Administered 2012-08-24: 50 ug via INTRAVENOUS
  Filled 2012-08-21 (×6): qty 2

## 2012-08-21 NOTE — Progress Notes (Signed)
PULMONARY  / CRITICAL CARE MEDICINE  Name: Donna [name] MRN: [id number] DOB: 04-22-1956    LOS: 1  REFERRING MD:  Transfer from [facility]  CHIEF COMPLAINT:  Unresponsive  BRIEF PATIENT DESCRIPTION: 56 yo found unresponsive next to empty Oxycodone bottle.  Seizure en route.  Intubated for airway protection.  Drug screen positive for cocaine.  STEMI, seen by Cardiology, not a candidate for emergent cardiac catheterization.  LINES / TUBES: 12/10  OETT 12/10  OGT 12/10  Foley  CULTURES: 12/10  Blood >>> 12/10  Urine >>>  ANTIBIOTICS: Levaquin 12/10 x 1 Vancomycin 12/10 x 1 Unasyn 12/10 >>>  SIGNIFICANT EVENTS:  12/10  Admitted with cocaine induced seizure / STEMI.  Intubated for airway protection.  LEVEL OF CARE:  ICU  PRIMARY SERVICE:  PCCM  CONSULTANTS:  Cardiology  CODE STATUS:  Full  DIET:  NPO  DVT Px:  Not indicated (on Heparin gtt)  GI Px:  Protonix   REVIEW OF SYSTEMS:  Unable to provide.  INTERVAL HISTORY: [name], wake up - no sedation given today   VITAL SIGNS: Temp:  [98.1 F (36.7 C)-102.5 F (39.2 C)] 99 F (37.2 C) (12/11 1500) Pulse Rate:  [93-135] 101  (12/11 1500) Resp:  [15-29] 19  (12/11 1500) BP: (78-150)/(32-109) 98/67 mmHg (12/11 1500) SpO2:  [92 %-100 %] 100 % (12/11 1500) FiO2 (%):  [40 %-100 %] 40 % (12/11 1517) Weight:  [58.2 kg (128 lb 4.9 oz)] 58.2 kg (128 lb 4.9 oz) (12/11 0300)  HEMODYNAMICS:    VENTILATOR SETTINGS: Vent Mode:  [-] PRVC FiO2 (%):  [40 %-100 %] 40 % Set Rate:  [14 bmp-15 bmp] 15 bmp Vt Set:  [500 mL] 500 mL PEEP:  [7 cmH20-8 cmH20] 8 cmH20 Plateau Pressure:  [15 cmH20-24 cmH20] 15 cmH20  INTAKE / OUTPUT: Intake/Output      12/10 0701 - 12/11 0700 12/11 0701 - 12/12 0700   I.V. (mL/kg) 2088.9 (35.9) 1276 (21.9)   NG/GT  100   IV Piggyback 710 100   Total Intake(mL/kg) 2798.9 (48.1) 1476 (25.4)   Urine (mL/kg/hr) 294 (0.2) 340 (0.7)   Total Output 294 340   Net +2504.9 +1136           PHYSICAL EXAMINATION: General:  Appears acutely ill, mechanically ventilated, synchronous Neuro:  Encephalopathic, nonfocal, cough / gag diminished, R sided weakness noted with good strength L  HEENT:  PERRL, OETT / OGT Cardiovascular:  RRR, tachycardic Lungs:  Bilateral diminished air entry, no w/r/r Abdomen:  Soft, nontender, bowel sounds diminished Musculoskeletal:  Moves all extremities, no edema Skin:  Intact  LABS:  Lab 08/21/12 0229 08/20/12 1926 08/20/12 1359  HGB 16.3* 16.8* 17.7*  HCT 48.4* 48.7* 52.4*  WBC 13.4* 15.7* 18.2*  PLT 134* 128* 163    Lab 08/21/12 0229 08/20/12 1926 08/20/12 1359  NA 146* 149* 151*  K 3.5 2.6* --  CL 111 113* 108  CO2 18* 21 25  GLUCOSE 113* 120* 116*  BUN 35* 30* 33*  CREATININE 1.51* 1.22* 2.06*  CALCIUM 8.4 8.5 9.4  MG -- 1.2* --  PHOS -- 2.8 --    Lab 08/21/12 0410 08/20/12 1929 08/20/12 1356  PHART 7.353 7.323* 7.342*  PCO2ART 36.1 44.0 43.9  PO2ART 80.7 144.0* 62.3*  HCO3 19.3* 21.7 23.2  TCO2 20.4 23.0 19.5  O2SAT 95.1 98.1 88.5    Lab 08/21/12 1132 08/21/12 0839 08/21/12 0348 08/21/12 0023 08/20/12 1749  GLUCAP 129* 109* 110* 133*  164*   IMAGING: 12/10  CXR >>> Hardware in good position, RUL airspace disease 12/10  Head CT >>> nad  ECG: 12/10  >>> Sinus tachycardia, ST elevations in anterolateral and inferior leads  DIAGNOSES: Active Problems:  Acute respiratory failure  Hypokalemia  COPD (chronic obstructive pulmonary disease)  Acute encephalopathy  Status epilepticus  Acute renal failure  Aspiration pneumonia  STEMI (ST elevation myocardial infarction)  Thrombocytopenia  Hypernatremia  Alcohol abuse  ASSESSMENT / PLAN:  PULMONARY  A:  Acute respiratory failure due to inability to protect airway.   Aspiration pneumonia.   COPD without exacerbation. P:   Gaol SpO2>92, pH>7.30 Full mechanical support Daily SBT Trend ABG / CXR Combivent Hold Symbicort / Spiriva  CARDIOVASCULAR  A: STEMI  in setting of Cocaine use.  Hypotension. P:  Seen by Cardiology - cardiac cath is not indicated Trend troponin (dropping) Heparin gtt ASA Beta-blocker / ACEI contraindicated - hypotension Statin contraindicated - elevated transaminases   RENAL  A:  Acute renal failure.   Hypernatremia.   Hypokalemia. P:   Trend BMP 1/2 NS, decrease to 50cc/h K 20  GASTROINTESTINAL  A:  Elevated transaminases - alcoholic pattern.  Hep C. P:   NPO TF if remains intubated Follow LFT  HEMATOLOGIC  A:  Thrombocytopenia, alcohol related? P:  Trend CBC  INFECTIOUS  A:  Aspiration pneumonia. P:   Antibiotics as above Follow cx data  ENDOCRINE   A:  No active issues.  At risk for hypoglycemia. P:   CBG  NEUROLOGIC  A:  Seizure disorder.  Status epilepticus in setting of Cocaine use.   Suspected alcohol abuse.   Acute encephalopathy.   Anxiety / depression / PTSD / OCD. R sided weakness >> ? Acute CVA that is not yet visualized on CT Head P:   Goal RASS 0 to -1 Fentanyl gtt Versed PRN Tegretol continued Keppra added Thiamine / Folate Hold Xanax, Elavil, Cymbalta, Neurontin Exam concerning for ? CVA. Continue heparin and ASA, consider neuro consult 12/12 if these sx continue as she wakes up  CLINICAL SUMMARY: 56 yo found unresponsive next to empty Oxychodone bottle.  Seizure en route.  Intubated for airway protection.  Drug screen positive for cocaine.  STEMI, seen by Cardiology, not a candidate for emergent cardiac catheterization - Heparin / Aspirin.  Empirical treatment for aspiration pneumonia.  I have personally obtained a history, examined the patient, evaluated laboratory and imaging results, formulated the assessment and plan and placed orders.  CRITICAL CARE:  The patient is critically ill with multiple organ systems failure and requires high complexity decision making for assessment and support, frequent evaluation and titration of therapies, application of advanced  monitoring technologies and extensive interpretation of multiple databases. Critical Care Time devoted to patient care services described in this note is 35 minutes.   Leslye Peer., MD  Pulmonary and Critical Care Medicine [facility] Pager: [phone]  08/21/2012, 3:43 PM

## 2012-08-21 NOTE — Progress Notes (Signed)
ANTICOAGULATION CONSULT NOTE - Follow Up Consult  Pharmacy Consult for Heparin Indication: chest pain/ACS  Allergies  Allergen Reactions  . Corn-Containing Products Shortness Of Breath and Swelling    REACTION: affects breathing  . Eggs Or Egg-Derived Products Shortness Of Breath  . Latex Shortness Of Breath, Itching and Rash  . Codeine Itching and Nausea And Vomiting  . Penicillins Other (See Comments)    REACTION: "thrush"  . Sulfa Antibiotics Other (See Comments)    REACTION: Unknown    Patient Measurements: Heparin Dosing Weight: 62  Vital Signs: Temp: 100 F (37.8 C) (12/11 0300) Temp src: Other (Comment) (12/10 1618) BP: 97/57 mmHg (12/11 0323) Pulse Rate: 117  (12/11 0323)  Labs:  Basename 08/21/12 0229 08/20/12 1926 08/20/12 1359  HGB -- 16.8* 17.7*  HCT -- 48.7* 52.4*  PLT -- 128* 163  APTT -- -- 30  LABPROT -- -- 14.3  INR -- -- 1.13  HEPARINUNFRC 0.29* 0.87* --  CREATININE -- 1.22* 2.06*  CKTOTAL -- -- 3339*  CKMB -- -- 60.5*  TROPONINI -- 7.47* 13.95*    The CrCl is unknown because both a height and weight (above a minimum accepted value) are required for this calculation.  Assessment: 56 year old female with STEMI in setting of cocaine use. Heparin level (0.29) is slightly below-goal on 900 units/hr. Previous heparin level (0.87) was with heparin rate of 1000 units/hr. No problem with line /infusion per RN. No signs/symptoms of bleeding per RN.   Goal of Therapy:  Heparin level 0.3-0.7 units/ml Monitor platelets by anticoagulation protocol: Yes   Plan:  1. Increase IV heparin to 950 units/hr.  2. Heparin level in 6 hours.   Lorre Munroe, PharmD  08/21/2012,3:36 AM

## 2012-08-21 NOTE — Progress Notes (Signed)
ANTICOAGULATION CONSULT NOTE - Follow Up Consult  Pharmacy Consult for Heparin Indication: chest pain/ACS  Allergies  Allergen Reactions  . Corn-Containing Products Shortness Of Breath and Swelling    REACTION: affects breathing  . Eggs Or Egg-Derived Products Shortness Of Breath  . Latex Shortness Of Breath, Itching and Rash  . Codeine Itching and Nausea And Vomiting  . Penicillins Other (See Comments)    REACTION: "thrush"  . Sulfa Antibiotics Other (See Comments)    REACTION: Unknown    Patient Measurements: Heparin Dosing Weight: 62  Vital Signs: Temp: 99.1 F (37.3 C) (12/11 1400) Temp src: Other (Comment) (12/11 1400) BP: 107/68 mmHg (12/11 1400) Pulse Rate: 103  (12/11 1400)  Labs:  Basename 08/21/12 1050 08/21/12 0904 08/21/12 0230 08/21/12 0229 08/20/12 1926 08/20/12 1359  HGB -- -- -- 16.3* 16.8* --  HCT -- -- -- 48.4* 48.7* 52.4*  PLT -- -- -- 134* 128* 163  APTT -- -- -- -- -- 30  LABPROT -- -- -- -- -- 14.3  INR -- -- -- -- -- 1.13  HEPARINUNFRC 0.21* -- -- 0.29* 0.87* --  CREATININE -- -- -- 1.51* 1.22* 2.06*  CKTOTAL -- -- -- -- -- 3339*  CKMB -- -- -- -- -- 60.5*  TROPONINI -- 3.86* 7.21* -- 7.47* --    Estimated Creatinine Clearance: 33 ml/min (by C-G formula based on Cr of 1.51).  Assessment: 56 year old female with STEMI in setting of cocaine use. Heparin level (0.21) is slightly below-goal on 950 units/hr. Previous heparin level (0.87) was with heparin rate of 1000 units/hr- unsure this was accurate. No problem with line /infusion per RN. No signs/symptoms of bleeding per RN. Plts up today at 134. H/H wnl.   Goal of Therapy:  Heparin level 0.3-0.7 units/ml Monitor platelets by anticoagulation protocol: Yes   Plan:  1. Increase IV heparin to 1050 units/hr.  2. Heparin level in 6 hours.   Link Snuffer, PharmD, BCPS Clinical Pharmacist [phone] 08/21/2012,3:06 PM

## 2012-08-21 NOTE — Progress Notes (Signed)
CRITICAL VALUE ALERT  Critical value received:  K+ 7.47  Date of notification:  08/21/2012  Time of notification:  12:27 AM  Critical value read back:yes  Nurse who received alert:  Vassie Moselle  MD notified (1st page):  elink- [email or web address]  Time of first page:  12:27 AM  MD notified (2nd page):  Time of second page:  Responding MD:  Marchelle Gearing  Time MD responded:  12:28 AM

## 2012-08-21 NOTE — Progress Notes (Signed)
Clinical Social Work  CSW received referral for substance abuse. Patient on vent and unable to participate in assessment at this time. No visitors present at bedside. CSW will follow up at a later time.  [place], [place] [phone]

## 2012-08-21 NOTE — Progress Notes (Signed)
INITIAL NUTRITION ASSESSMENT  DOCUMENTATION CODES Per approved criteria  -Not Applicable   INTERVENTION:  If unable to extubate patient within the next 24 hours, recommend start TF via OGT with Jevity 1.2 at 20 ml/h, increase by 10 ml every 4 hours to goal rate of 60 ml/h to provide 1728 kcals, 80 gm protein, 1166 ml free water daily.  NUTRITION DIAGNOSIS: Inadequate oral intake related to inability to eat as evidenced by NPO status.   Goal: Intake to meet >90% of estimated nutrition needs.  Monitor:  TF initiation/tolerance/adequacy, weight trend, labs, I/O.  Reason for Assessment: VDRF  56 y.o. female  Admitting Dx: found unresponsive next to empty Oxycodone bottle. Seizure en route. Intubated for airway protection. Drug screen positive for cocaine. STEMI, seen by Cardiology, not a candidate for emergent cardiac catheterization.  ASSESSMENT: Patient is currently intubated on ventilator support.  MV: 10.3 Temp:Temp (24hrs), Avg:100.6 F (38.1 C), Min:98.1 F (36.7 C), Max:102.5 F (39.2 C)  Patient was discussed in ICU rounds this morning.  S/P EKG this morning, positive for STEMI.  No plans for cath at this time.  Patient is at nutrition risk, given recent 17% weight loss in the past 4 months.  Suspect diet PTA was poor given history of drug abuse.  Patient's face appears thin, suspect some muscle depletion in temporal area.  Height: Ht Readings from Last 1 Encounters:  08/21/12 5' 0.24" (1.53 m)   Weight: Wt Readings from Last 1 Encounters:  08/21/12 128 lb 4.9 oz (58.2 kg)   Ideal Body Weight: 45.5 kg  % Ideal Body Weight: 128%  Wt Readings from Last 10 Encounters:  08/21/12 128 lb 4.9 oz (58.2 kg)  07/18/12 136 lb 6.4 oz (61.871 kg)  05/28/12 140 lb 6.9 oz (63.7 kg)  05/28/12 140 lb 6.9 oz (63.7 kg)  05/14/12 151 lb 9.6 oz (68.765 kg)  05/09/12 152 lb 6.4 oz (69.128 kg)  04/26/12 155 lb 10.3 oz (70.6 kg)   Usual Body Weight: 155 lb 4 months ago  % Usual  Body Weight: 83%  BMI:  Body mass index is 24.86 kg/(m^2).  Estimated Nutritional Needs: Kcal: 1706 Protein: 80-95 gm Fluid: 1.7-1.8 L  Skin: intact  Diet Order:  NPO  EDUCATION NEEDS: -Education not appropriate at this time   Intake/Output Summary (Last 24 hours) at 08/21/12 1107 Last data filed at 08/21/12 1000  Gross per 24 hour  Intake 3377.44 ml  Output    469 ml  Net 2908.44 ml   Last BM: unknown   Labs:   Lab 08/21/12 0229 08/20/12 1926 08/20/12 1359  NA 146* 149* 151*  K 3.5 2.6* 3.0*  CL 111 113* 108  CO2 18* 21 25  BUN 35* 30* 33*  CREATININE 1.51* 1.22* 2.06*  CALCIUM 8.4 8.5 9.4  MG -- 1.2* --  PHOS -- 2.8 --  GLUCOSE 113* 120* 116*    CBG (last 3)   Basename 08/21/12 0839 08/21/12 0348 08/21/12 0023  GLUCAP 109* 110* 133*    Scheduled Meds:   . albuterol-ipratropium  6 puff Inhalation Q4H  . ampicillin-sulbactam (UNASYN) IV  3 g Intravenous Q8H  . antiseptic oral rinse  15 mL Mouth Rinse QID  . [COMPLETED] aspirin  300 mg Rectal Once  . aspirin  325 mg Oral Daily  . carBAMazepine  400 mg Per Tube TID  . chlorhexidine  15 mL Mouth Rinse BID  . [COMPLETED] fentaNYL  50 mcg Intravenous Once  . folic acid  1  mg Oral Daily  . [COMPLETED] heparin  4,000 Units Intravenous Once  . [COMPLETED] levetiracetam  1,000 mg Intravenous Once  . levetiracetam  1,000 mg Intravenous Q12H  . [COMPLETED] levofloxacin (LEVAQUIN) IV  750 mg Intravenous Once  . [COMPLETED] LORazepam  1 mg Intravenous Once  . [COMPLETED] magnesium sulfate 1 - 4 g bolus IVPB  4 g Intravenous Once  . [COMPLETED] midazolam  2 mg Intravenous Once  . pantoprazole (PROTONIX) IV  40 mg Intravenous Q24H  . [COMPLETED] potassium chloride  40 mEq Per Tube Once  . [COMPLETED] sodium chloride  1,000 mL Intravenous Once  . [COMPLETED] sodium chloride  1,000 mL Intravenous Once  . [COMPLETED] sodium chloride  1,000 mL Intravenous Once  . [COMPLETED] sodium chloride  1,000 mL Intravenous  Once  . [COMPLETED] sodium chloride  500 mL Intravenous Once  . thiamine  100 mg Intravenous Daily  . [COMPLETED] vancomycin  1,000 mg Intravenous Once  . [DISCONTINUED] aspirin  324 mg Oral Once  . [DISCONTINUED] folic acid  1 mg Intravenous Daily  . [DISCONTINUED] magnesium sulfate 1 - 4 g bolus IVPB  4 g Intravenous Once  . [DISCONTINUED] potassium chloride  10 mEq Intravenous Q1 Hr x 2  . [DISCONTINUED] vancomycin  750 mg Intravenous Q24H   Continuous Infusions:   . sodium chloride 150 mL/hr at 08/21/12 1000  . fentaNYL infusion INTRAVENOUS Stopped (08/21/12 0000)  . heparin 950 Units/hr (08/21/12 1000)  . [DISCONTINUED] midazolam (VERSED) infusion Stopped (08/20/12 1821)    Past Medical History  Diagnosis Date  . Coronary artery disease   . Degenerative disc disease   . Asthma   . Lupus   . Hepatitis C   . Heart attack   . Anxiety   . Obsessive-compulsive disorder   . Depression   . PTSD (post-traumatic stress disorder)   . Seizures     Past Surgical History  Procedure Date  . Cholecystectomy   . Appendectomy   . Tonsillectomy   . Tubal ligation   . Polyps on vocal cords   . Cardiac catheterization   . Esophagogastroduodenoscopy 06/02/2012    Procedure: ESOPHAGOGASTRODUODENOSCOPY (EGD);  Surgeon: Malissa Hippo, MD;  [facility];  Service: Endoscopy;  Laterality: N/A;  . 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

## 2012-08-21 NOTE — Progress Notes (Signed)
Nursing 1345 Dr. Delton Coombes made aware of patient's neurological assessment and that patient has received no sedation/analgesia this shift.  Dr. Delton Coombes also made aware that patient's BP has been low at times, but maintaining MAP and that UOP has been minimal.  No new orders at this time.

## 2012-08-21 NOTE — Progress Notes (Signed)
[facility] ADULT ICU REPLACEMENT PROTOCOL FOR AM LAB REPLACEMENT ONLY  The patient does not apply for the [facility] Adult ICU Electrolyte Replacment Protocol based on the criteria listed below:    Is BUN < 30 mg/dL? no  Patient's BUN today is 35  Abnormal electrolyte(s):K3.5    Melrose Nakayama 08/21/2012 5:35 AM

## 2012-08-21 NOTE — Progress Notes (Signed)
eLink Physician-Brief Progress Note Patient Name: Donna [name] DOB: 1956/01/07 MRN: [id number]  Date of Service  08/21/2012   HPI/Events of Note   flucutuant bp with intermittent hypotension  eICU Interventions  500cc saline bolus   Intervention Category Major Interventions: Hypotension - evaluation and management  Nayomi Tabron 08/21/2012, 1:21 AM

## 2012-08-21 NOTE — Progress Notes (Signed)
  Echocardiogram 2D Echocardiogram has been performed.  Donna [name] 08/21/2012, 9:58 AM 

## 2012-08-21 NOTE — Progress Notes (Signed)
ANTICOAGULATION CONSULT NOTE - Follow Up Consult  Pharmacy Consult for Heparin Indication: chest pain/ACS  Allergies  Allergen Reactions  . Corn-Containing Products Shortness Of Breath and Swelling    REACTION: affects breathing  . Eggs Or Egg-Derived Products Shortness Of Breath  . Latex Shortness Of Breath, Itching and Rash  . Codeine Itching and Nausea And Vomiting  . Penicillins Other (See Comments)    REACTION: "thrush"  . Sulfa Antibiotics Other (See Comments)    REACTION: Unknown    Patient Measurements: Heparin Dosing Weight: 62  Vital Signs: Temp: 98.6 F (37 C) (12/11 2200) Temp src: Core (Comment) (12/11 2000) BP: 90/53 mmHg (12/11 2200) Pulse Rate: 87  (12/11 2200)  Labs:  Basename 08/21/12 2119 08/21/12 1050 08/21/12 0904 08/21/12 0230 08/21/12 0229 08/20/12 1926 08/20/12 1359  HGB -- -- -- -- 16.3* 16.8* --  HCT -- -- -- -- 48.4* 48.7* 52.4*  PLT -- -- -- -- 134* 128* 163  APTT -- -- -- -- -- -- 30  LABPROT -- -- -- -- -- -- 14.3  INR -- -- -- -- -- -- 1.13  HEPARINUNFRC 0.27* 0.21* -- -- 0.29* -- --  CREATININE -- -- -- -- 1.51* 1.22* 2.06*  CKTOTAL -- -- -- -- -- -- 3339*  CKMB -- -- -- -- -- -- 60.5*  TROPONINI -- -- 3.86* 7.21* -- 7.47* --    Estimated Creatinine Clearance: 33 ml/min (by C-G formula based on Cr of 1.51).  Assessment: 56 year old female with STEMI in setting of cocaine use. Heparin level (0.27) is still slightly below-goal after rate increased to 1050 units/hr. Previous heparin level (0.87) was with heparin rate of 1000 units/hr- unsure this was accurate.  Goal of Therapy:  Heparin level 0.3-0.7 units/ml Monitor platelets by anticoagulation protocol: Yes   Plan:  1. Increase IV heparin to 1150 units/hr.  2. Heparin level in 6 hours.   Bayard Hugger, PharmD, BCPS  Clinical Pharmacist  Pager: [phone]   08/21/2012,10:31 PM

## 2012-08-21 NOTE — Progress Notes (Signed)
Nursing [phone] Dr. Delton Coombes made aware of EKG results this AM, patient having echo completed at this time.  No further orders given.

## 2012-08-22 ENCOUNTER — Inpatient Hospital Stay (HOSPITAL_COMMUNITY): Payer: Medicare Other

## 2012-08-22 DIAGNOSIS — R569 Unspecified convulsions: Secondary | ICD-10-CM

## 2012-08-22 LAB — BASIC METABOLIC PANEL
BUN: 22 mg/dL (ref 6–23)
Calcium: 8.7 mg/dL (ref 8.4–10.5)
Calcium: 8.7 mg/dL (ref 8.4–10.5)
Chloride: 118 mEq/L — ABNORMAL HIGH (ref 96–112)
Creatinine, Ser: 0.61 mg/dL (ref 0.50–1.10)
GFR calc Af Amer: >90 mL/min (ref >90)
GFR calc Af Amer: >90 mL/min (ref >90)
GFR calc non Af Amer: >90 mL/min (ref >90)
GFR calc non Af Amer: >90 mL/min (ref >90)
Glucose, Bld: 106 mg/dL — ABNORMAL HIGH (ref 70–99)
Potassium: 3 mEq/L — ABNORMAL LOW (ref 3.5–5.1)
Sodium: 146 mEq/L — ABNORMAL HIGH (ref 135–145)

## 2012-08-22 LAB — HEPARIN LEVEL (UNFRACTIONATED)
Heparin Unfractionated: 0.28 IU/mL — ABNORMAL LOW (ref 0.30–0.70)
Heparin Unfractionated: 0.3 IU/mL (ref 0.30–0.70)
Heparin Unfractionated: 0.23 IU/mL — ABNORMAL LOW (ref 0.30–0.70)

## 2012-08-22 LAB — BLOOD GAS, ARTERIAL
Acid-base deficit: 1.4 mmol/L (ref 0.0–2.0)
Bicarbonate: 23 mEq/L (ref 20.0–24.0)
FIO2: 0.4 %
MECHVT: 500 mL
TCO2: 24.2 mmol/L (ref 0–100)
pCO2 arterial: 39.2 mmHg (ref 35.0–45.0)
pH, Arterial: 7.386 (ref 7.350–7.450)
pO2, Arterial: 89.5 mmHg (ref 80.0–100.0)

## 2012-08-22 LAB — MAGNESIUM: Magnesium: 2.1 mg/dL (ref 1.5–2.5)

## 2012-08-22 LAB — PHOSPHORUS: Phosphorus: 2.1 mg/dL — ABNORMAL LOW (ref 2.3–4.6)

## 2012-08-22 LAB — CBC
Hemoglobin: 13.4 g/dL (ref 12.0–15.0)
MCHC: 32.7 g/dL (ref 30.0–36.0)
Platelets: 113 10*3/uL — ABNORMAL LOW (ref 150–400)

## 2012-08-22 LAB — GLUCOSE, CAPILLARY
Glucose-Capillary: 102 mg/dL — ABNORMAL HIGH (ref 70–99)
Glucose-Capillary: 114 mg/dL — ABNORMAL HIGH (ref 70–99)
Glucose-Capillary: 89 mg/dL (ref 70–99)
Glucose-Capillary: 90 mg/dL (ref 70–99)
Glucose-Capillary: 96 mg/dL (ref 70–99)

## 2012-08-22 LAB — PROCALCITONIN: Procalcitonin: 0.8 ng/mL

## 2012-08-22 MED ORDER — POTASSIUM CHLORIDE 20 MEQ/15ML (10%) PO LIQD
30.0000 meq | ORAL | Status: AC
  Administered 2012-08-22 (×2): 30 meq
  Filled 2012-08-22 (×2): qty 15
  Filled 2012-08-22: qty 30

## 2012-08-22 MED ORDER — DEXTROSE-NACL 5-0.45 % IV SOLN
INTRAVENOUS | Status: DC
  Administered 2012-08-22: 20 mL/h via INTRAVENOUS

## 2012-08-22 MED ORDER — DEXMEDETOMIDINE HCL IN NACL 200 MCG/50ML IV SOLN
0.2000 ug/kg/h | INTRAVENOUS | Status: DC
  Administered 2012-08-22: 0.2 ug/kg/h via INTRAVENOUS
  Administered 2012-08-23: 0.4 ug/kg/h via INTRAVENOUS
  Administered 2012-08-23: 0.7 ug/kg/h via INTRAVENOUS
  Administered 2012-08-23 (×2): 1.1 ug/kg/h via INTRAVENOUS
  Administered 2012-08-23: 0.4 ug/kg/h via INTRAVENOUS
  Administered 2012-08-24 (×2): 1 ug/kg/h via INTRAVENOUS
  Administered 2012-08-24: 1.2 ug/kg/h via INTRAVENOUS
  Filled 2012-08-22 (×9): qty 50

## 2012-08-22 NOTE — Progress Notes (Signed)
ANTICOAGULATION CONSULT NOTE - Follow Up Consult  Pharmacy Consult for heparin Indication: STEMI  Labs:  Basename 08/22/12 0333 08/21/12 2119 08/21/12 1050 08/21/12 0904 08/21/12 0230 08/21/12 0229 08/20/12 1926 08/20/12 1359  HGB 13.4 -- -- -- -- 16.3* -- --  HCT 41.0 -- -- -- -- 48.4* 48.7* --  PLT PENDING -- -- -- -- 134* 128* --  APTT -- -- -- -- -- -- -- 30  LABPROT -- -- -- -- -- -- -- 14.3  INR -- -- -- -- -- -- -- 1.13  HEPARINUNFRC 0.28* 0.27* 0.21* -- -- -- -- --  CREATININE -- -- -- -- -- 1.51* 1.22* 2.06*  CKTOTAL -- -- -- -- -- -- -- 3339*  CKMB -- -- -- -- -- -- -- 60.5*  TROPONINI -- -- -- 3.86* 7.21* -- 7.47* --    Assessment: 56yo female remains subtherapeutic on heparin with very little change in level despite rate increase.  Goal of Therapy:  Heparin level 0.3-0.7 units/ml   Plan:  Will increase heparin gtt by 2-3 units/kg/hr to 1300 units/hr and check level in 8hr.  Colleen Can PharmD BCPS 08/22/2012,4:23 AM

## 2012-08-22 NOTE — Progress Notes (Signed)
ANTICOAGULATION CONSULT NOTE - Follow Up Consult  Pharmacy Consult for Heparin Indication: chest pain/ACS  Labs:  Basename 08/22/12 1810 08/22/12 1210 08/22/12 0333 08/21/12 0904 08/21/12 0230 08/21/12 0229 08/20/12 1926 08/20/12 1359  HGB -- -- 13.4 -- -- 16.3* -- --  HCT -- -- 41.0 -- -- 48.4* 48.7* --  PLT -- -- 113* -- -- 134* 128* --  APTT -- -- -- -- -- -- -- 30  LABPROT -- -- -- -- -- -- -- 14.3  INR -- -- -- -- -- -- -- 1.13  HEPARINUNFRC 0.23* 0.30 0.28* -- -- -- -- --  CREATININE -- -- 0.79 -- -- 1.51* 1.22* --  CKTOTAL -- -- -- -- -- -- -- 3339*  CKMB -- -- -- -- -- -- -- 60.5*  TROPONINI -- -- -- 3.86* 7.21* -- 7.47* --    Estimated Creatinine Clearance: 63.3 ml/min (by C-G formula based on Cr of 0.79).    Assessment: 56 year old female with STEMI in setting of cocaine use. Heparin level (0.30) is now barely therapeutic on 1300 units/hr this AM. RN reports some black, tarry stools.   Heparin level now below goal at 0.23  Goal of Therapy:  Heparin level 0.3-0.7 units/ml Monitor platelets by anticoagulation protocol: Yes   Plan:  Increase heparin to 1500 units/hr. Follow up AM heparin level, CBC  Thank you. Okey Regal, PharmD [phone] 08/22/2012 6:53 PM

## 2012-08-22 NOTE — Progress Notes (Signed)
RN Progress Note  11:00 Assumed care of patient. Received report from [employer]. Patient BP low (88/59) from recent Versed administration. MAP stable at 66. MD aware. HR stable (91). Patient resting comfortably in bed. Please view 11:30 assessment for complete head to toe documentation. Will continue to monitor.

## 2012-08-22 NOTE — Progress Notes (Signed)
Clinical Social Work  Patient discussed during progression meeting. Patient still on vent and unable to participate in assessment. CSW will continue to follow.  [place], [place] [phone]

## 2012-08-22 NOTE — Progress Notes (Signed)
[facility] ADULT ICU REPLACEMENT PROTOCOL FOR AM LAB REPLACEMENT ONLY  The patient does apply for the [facility] Adult ICU Electrolyte Replacment Protocol based on the criteria listed below:   1. Is GFR >/= 50 ml/min? yes  Patient's GFR today is >90 2. Is urine output >/= 0.5 ml/kg/hr for the last 8 hours? yes Patient's UOP is 0.6 ml/kg/hr 3. Is BUN < 30 mg/dL? yes  Patient's BUN today is 28 4. Abnormal electrolyte(s): potassium 3.0 5. Ordered repletion with:Potassium per protocol 6. If a panic level lab has been reported, has the CCM MD in charge been notified? no.   Physician:    Orland Dec P 08/22/2012 4:44 AM

## 2012-08-22 NOTE — Progress Notes (Signed)
PULMONARY  / CRITICAL CARE MEDICINE  Name: Donna [name] MRN: [id number] DOB: 14-Jul-1956    LOS: 2  REFERRING MD:  Transfer from [facility] ED  CHIEF COMPLAINT:  Unresponsive  BRIEF PATIENT DESCRIPTION: Donna [name] found unresponsive next to empty Oxycodone bottle.  Seizure en route.  Intubated for airway protection.  Drug screen positive for cocaine.  STEMI, seen by Cardiology, not a candidate for emergent cardiac catheterization.  LINES / TUBES: 12/10  OETT 12/10  OGT 12/10  Foley  CULTURES: 12/10  Blood >>> 12/10  Urine >>>  ANTIBIOTICS: Levaquin 12/10 x 1 Vancomycin 12/10 x 1 Unasyn 12/10 >>>  SIGNIFICANT EVENTS:  12/10  Admitted with cocaine induced seizure / STEMI.  Intubated for airway protection.  LEVEL OF CARE:  ICU  PRIMARY SERVICE:  PCCM  CONSULTANTS:  Cardiology  CODE STATUS:  Full  DIET:  NPO  DVT Px:  Not indicated (on Heparin gtt)  GI Px:  Protonix   REVIEW OF SYSTEMS:  Unable to provide.  INTERVAL HISTORY: Some agitation reported this am, but not waking up appropriately or interacting Some dark stools reported, Hgb stable  VITAL SIGNS: Temp:  [97.8 F (36.6 C)-99.4 F (37.4 C)] 98.7 F (37.1 C) (12/12 1400) Pulse Rate:  [79-108] 87  (12/12 1514) Resp:  [0-23] 22  (12/12 1514) BP: (88-114)/(53-83) 101/83 mmHg (12/12 1514) SpO2:  [96 %-100 %] 100 % (12/12 1514) FiO2 (%):  [40 %] 40 % (12/12 1514) Weight:  [60.3 kg (132 lb 15 oz)] 60.3 kg (132 lb 15 oz) (12/12 0456)  HEMODYNAMICS:    VENTILATOR SETTINGS: Vent Mode:  [-] PRVC FiO2 (%):  [40 %] 40 % Set Rate:  [15 bmp] 15 bmp Vt Set:  [500 mL] 500 mL PEEP:  [8 cmH20] 8 cmH20 Plateau Pressure:  [14 cmH20-18 cmH20] 18 cmH20  INTAKE / OUTPUT: Intake/Output      12/11 0701 - 12/12 0700 12/12 0701 - 12/13 0700   I.V. (mL/kg) 2256.4 (37.4) 441 (7.3)   NG/GT 220    IV Piggyback 310    Total Intake(mL/kg) 2786.4 (46.2) 441 (7.3)   Urine (mL/kg/hr) 825 (0.6) 325 (0.5)   Total Output 825 325    Net +1961.4 +116          PHYSICAL EXAMINATION: General:  Appears acutely ill, mechanically ventilated, synchronous Neuro:  Encephalopathic, nonfocal, cough / gag diminished, R sided weakness noted with good strength L  HEENT:  PERRL, OETT / OGT Cardiovascular:  RRR, tachycardic Lungs:  Bilateral diminished air entry, no w/r/r Abdomen:  Soft, nontender, bowel sounds diminished Musculoskeletal:  Moves all extremities, no edema Skin:  Intact  LABS:  Lab 08/22/12 0333 08/21/12 0229 08/20/12 1926  HGB 13.4 16.3* 16.8*  HCT 41.0 48.4* 48.7*  WBC 8.9 13.4* 15.7*  PLT 113* 134* 128*    Lab 08/22/12 0333 08/21/12 0229 08/20/12 1926 08/20/12 1359  NA 146* 146* 149* 151*  K 3.0* 3.5 -- --  CL 115* 111 113* 108  CO2 21 18* 21 25  GLUCOSE 106* 113* 120* 116*  BUN 28* 35* 30* 33*  CREATININE 0.79 1.51* 1.22* 2.06*  CALCIUM 8.7 8.4 8.5 9.4  MG 2.1 -- 1.2* --  PHOS 2.1* -- 2.8 --    Lab 08/22/12 0350 08/21/12 0410 08/20/12 1929 08/20/12 1356  PHART 7.386 7.353 7.323* 7.342*  PCO2ART 39.2 36.1 44.0 43.9  PO2ART 89.5 80.7 144.0* 62.3*  HCO3 23.0 19.3* 21.7 23.2  TCO2 24.2 20.4 23.0 19.5  O2SAT 97.1  95.1 98.1 88.5    Lab 08/22/12 1545 08/22/12 1132 08/22/12 0843 08/22/12 0343 08/21/12 2340  GLUCAP 90 102* 96 100* 114*   IMAGING: 12/10  CXR >>> Hardware in good position, RUL airspace disease 12/10  Head CT >>> nad  ECG: 12/10  >>> Sinus tachycardia, ST elevations in anterolateral and inferior leads  DIAGNOSES: Active Problems:  Acute respiratory failure  Hypokalemia  COPD (chronic obstructive pulmonary disease)  Acute encephalopathy  Status epilepticus  Acute renal failure  Aspiration pneumonia  STEMI (ST elevation myocardial infarction)  Thrombocytopenia  Hypernatremia  Alcohol abuse  ASSESSMENT / PLAN:  PULMONARY  A:  Acute respiratory failure due to inability to protect airway.   Aspiration pneumonia.   COPD without exacerbation. P:   Gaol SpO2>92,  pH>7.30 Full mechanical support Daily SBT, not a candidate for extubation due to MS Trend ABG / CXR Combivent Holding Symbicort / Spiriva  CARDIOVASCULAR  A: STEMI in setting of Cocaine use.  Hypotension. P:  Seen by Cardiology - no plans for cath Heparin gtt ASA Beta-blocker / ACEI >> start when BP able to tolerate Statin contraindicated - elevated transaminases   RENAL  A:  Acute renal failure.   Hypernatremia.   Hypokalemia. P:   Trend BMP 1/2 NS, decrease to kvo   GASTROINTESTINAL  A:  Elevated transaminases - alcoholic pattern.   Hep C. Possible melena reported, stable Hgb P:   Check FOBT with reported dark stools, follow CBC NPO TF if remains intubated Follow LFT  HEMATOLOGIC  A:  Thrombocytopenia, alcohol related? P:  Trend CBC  INFECTIOUS  A:  Aspiration pneumonia. P:   Unasyn day 3 Follow cx data  ENDOCRINE   A:  No active issues.  At risk for hypoglycemia. P:   CBG Add D5 to fluids  NEUROLOGIC  A:  Seizure disorder.  Status epilepticus in setting of Cocaine use >> no further seizures noted Suspected alcohol abuse.   Acute encephalopathy.   Anxiety / depression / PTSD / OCD. R sided weakness >> ? Acute CVA that is not yet visualized on CT Head P:   Goal RASS 0 to -1 Start Precedex 12/12 Tegretol + Keppra  Thiamine / Folate Hold Xanax, Elavil, Cymbalta, Neurontin Exam concerning for ? CVA. Continue heparin and ASA  CLINICAL SUMMARY: Donna [name] found unresponsive next to empty Oxychodone bottle.  Seizure en route.  Intubated for airway protection.  Drug screen positive for cocaine.  STEMI, seen by Cardiology, not a candidate for emergent cardiac catheterization - Heparin / Aspirin.  Empirical treatment for aspiration pneumonia.  I have personally obtained a history, examined the patient, evaluated laboratory and imaging results, formulated the assessment and plan and placed orders.  CRITICAL CARE:  The patient is critically ill with  multiple organ systems failure and requires high complexity decision making for assessment and support, frequent evaluation and titration of therapies, application of advanced monitoring technologies and extensive interpretation of multiple databases. Critical Care Time devoted to patient care services described in this note is 35 minutes.   Leslye Peer., MD  Pulmonary and Critical Care Medicine [facility] Pager: [phone]  08/22/2012, 4:53 PM

## 2012-08-22 NOTE — Progress Notes (Signed)
ANTICOAGULATION CONSULT NOTE - Follow Up Consult  Pharmacy Consult for Heparin Indication: chest pain/ACS  Allergies  Allergen Reactions  . Corn-Containing Products Shortness Of Breath and Swelling    REACTION: affects breathing  . Eggs Or Egg-Derived Products Shortness Of Breath  . Latex Shortness Of Breath, Itching and Rash  . Codeine Itching and Nausea And Vomiting  . Penicillins Other (See Comments)    REACTION: "thrush"  . Sulfa Antibiotics Other (See Comments)    REACTION: Unknown    Patient Measurements: Height: 5' 0.24" (153 cm) Weight: 132 lb 15 oz (60.3 kg) IBW/kg (Calculated) : 46.04  Heparin Dosing Weight: 58 kg  Vital Signs: Temp: 98.8 F (37.1 C) (12/12 1200) BP: 114/71 mmHg (12/12 1227) Pulse Rate: 93  (12/12 1227)  Labs:  Basename 08/22/12 1210 08/22/12 0333 08/21/12 2119 08/21/12 0904 08/21/12 0230 08/21/12 0229 08/20/12 1926 08/20/12 1359  HGB -- 13.4 -- -- -- 16.3* -- --  HCT -- 41.0 -- -- -- 48.4* 48.7* --  PLT -- 113* -- -- -- 134* 128* --  APTT -- -- -- -- -- -- -- 30  LABPROT -- -- -- -- -- -- -- 14.3  INR -- -- -- -- -- -- -- 1.13  HEPARINUNFRC 0.30 0.28* 0.27* -- -- -- -- --  CREATININE -- 0.79 -- -- -- 1.51* 1.22* --  CKTOTAL -- -- -- -- -- -- -- 3339*  CKMB -- -- -- -- -- -- -- 60.5*  TROPONINI -- -- -- 3.86* 7.21* -- 7.47* --    Estimated Creatinine Clearance: 63.3 ml/min (by C-G formula based on Cr of 0.79).   Medications:  Scheduled:    . albuterol-ipratropium  6 puff Inhalation Q4H  . ampicillin-sulbactam (UNASYN) IV  3 g Intravenous Q8H  . antiseptic oral rinse  15 mL Mouth Rinse QID  . aspirin  325 mg Oral Daily  . carBAMazepine  400 mg Per Tube TID  . chlorhexidine  15 mL Mouth Rinse BID  . folic acid  1 mg Oral Daily  . levetiracetam  1,000 mg Intravenous Q12H  . pantoprazole (PROTONIX) IV  40 mg Intravenous Q24H  . [COMPLETED] potassium chloride  30 mEq Per Tube Q4H  . thiamine  100 mg Intravenous Daily   Infusions:     . sodium chloride 50 mL/hr at 08/22/12 1200  . heparin 1,300 Units/hr (08/22/12 1200)  . [DISCONTINUED] fentaNYL infusion INTRAVENOUS Stopped (08/21/12 0000)  . [DISCONTINUED] heparin 950 Units/hr (08/21/12 1400)    Assessment: 56 year old female with STEMI in setting of cocaine use. Heparin level (0.30) is now barely therapeutic on 1300 units/hr. RN reports some black, tarry stools. Will leave heparin at current rate (no increase) until bleed ruled out. Follow up FOBT. Hg drop from 16.3 to 13.4. Platelets low at 113. Will obtain confirmatory heparin level in 6 hours.   Goal of Therapy:  Heparin level 0.3-0.7 units/ml Monitor platelets by anticoagulation protocol: Yes   Plan:  Continue heparin at 1300 units/hr. Follow up 6 hour heparin level at 1800.  Lillia Pauls, PharmD Clinical Pharmacist Pager: [phone] Phone: [phone] 08/22/2012 2:02 PM

## 2012-08-23 ENCOUNTER — Inpatient Hospital Stay (HOSPITAL_COMMUNITY): Payer: Medicare Other

## 2012-08-23 LAB — BASIC METABOLIC PANEL
Calcium: 8.9 mg/dL (ref 8.4–10.5)
Creatinine, Ser: 0.6 mg/dL (ref 0.50–1.10)
GFR calc Af Amer: >90 mL/min (ref >90)
GFR calc non Af Amer: >90 mL/min (ref >90)
Sodium: 148 mEq/L — ABNORMAL HIGH (ref 135–145)

## 2012-08-23 LAB — CBC
MCH: 30.4 pg (ref 26.0–34.0)
MCHC: 32.3 g/dL (ref 30.0–36.0)
RDW: 13.5 % (ref 11.5–15.5)

## 2012-08-23 LAB — GLUCOSE, CAPILLARY: Glucose-Capillary: 113 mg/dL — ABNORMAL HIGH (ref 70–99)

## 2012-08-23 LAB — HEPARIN LEVEL (UNFRACTIONATED): Heparin Unfractionated: 0.64 IU/mL (ref 0.30–0.70)

## 2012-08-23 LAB — PHOSPHORUS: Phosphorus: 2.5 mg/dL (ref 2.3–4.6)

## 2012-08-23 LAB — MAGNESIUM: Magnesium: 1.9 mg/dL (ref 1.5–2.5)

## 2012-08-23 MED ORDER — JEVITY 1.2 CAL PO LIQD
1000.0000 mL | ORAL | Status: DC
  Administered 2012-08-23: 20 mL/h
  Filled 2012-08-23 (×4): qty 1000

## 2012-08-23 MED ORDER — VITAMIN B-1 100 MG PO TABS
100.0000 mg | ORAL_TABLET | Freq: Every day | ORAL | Status: DC
  Administered 2012-08-23: 100 mg via ORAL
  Filled 2012-08-23 (×2): qty 1

## 2012-08-23 MED ORDER — FREE WATER
200.0000 mL | Freq: Three times a day (TID) | Status: DC
  Administered 2012-08-23 – 2012-08-24 (×3): 200 mL

## 2012-08-23 NOTE — Progress Notes (Signed)
PULMONARY  / CRITICAL CARE MEDICINE  Name: Donna [name] MRN: [id number] DOB: Apr 06, 1956    LOS: 3  REFERRING MD:  [facility] from [facility]  CHIEF COMPLAINT:  Unresponsive  BRIEF PATIENT DESCRIPTION: 56 yo found unresponsive next to empty Oxycodone bottle.  Seizure en route.  Intubated for airway protection.  Drug screen positive for cocaine.  STEMI, seen by Cardiology, not a candidate for emergent cardiac catheterization.  LINES / TUBES: 12/10  OETT 12/10  OGT 12/10  Foley  CULTURES: 12/10  Blood >>> 12/10  Urine >>> no growth  ANTIBIOTICS: Levaquin 12/10 x 1 Vancomycin 12/10 x 1 Unasyn 12/10 >>>  SIGNIFICANT EVENTS:  12/10  Admitted with cocaine induced seizure / STEMI.  Intubated for airway protection.  LEVEL OF CARE:  ICU  PRIMARY SERVICE:  PCCM  CONSULTANTS:  Cardiology  CODE STATUS:  Full  DIET:  NPO  DVT Px:  Heparin gtt  GI Px:  Protonix  INTERVAL HISTORY: Less agitated with addition precedex, but still received some versed overnight FOBT >> not yet collected Continues to have R weakness, Head CT scan on presentation was negative  VITAL SIGNS: Temp:  [98.7 F (37.1 C)-99 F (37.2 C)] 98.7 F (37.1 C) (12/12 1400) Pulse Rate:  [58-93] 58  (12/13 0928) Resp:  [5-26] 22  (12/13 0928) BP: (72-114)/(47-83) 101/64 mmHg (12/13 0900) SpO2:  [98 %-100 %] 100 % (12/13 0928) FiO2 (%):  [40 %] 40 % (12/13 0828) Weight:  [58.2 kg (128 lb 4.9 oz)] 58.2 kg (128 lb 4.9 oz) (12/13 0500)  HEMODYNAMICS:    VENTILATOR SETTINGS: Vent Mode:  [-] PRVC FiO2 (%):  [40 %] 40 % Set Rate:  [15 bmp] 15 bmp Vt Set:  [500 mL] 500 mL PEEP:  [5 cmH20-8 cmH20] 5 cmH20 Plateau Pressure:  [13 cmH20-16 cmH20] 16 cmH20  INTAKE / OUTPUT: Intake/Output      12/12 0701 - 12/13 0700 12/13 0701 - 12/14 0700   I.V. (mL/kg) 1167 (20.1) 87 (1.5)   NG/GT  30   IV Piggyback     Total Intake(mL/kg) 1167 (20.1) 117 (2)   Urine (mL/kg/hr) 967 (0.7) 60   Total Output 967 60   Net  +200 +57          PHYSICAL EXAMINATION: General:  Appears acutely ill, mechanically ventilated, synchronous Neuro:  Encephalopathic, nonfocal, cough / gag diminished, R sided weakness noted with good strength L  HEENT:  PERRL, OETT / OGT Cardiovascular:  RRR, tachycardic Lungs:  Bilateral diminished air entry, no w/r/r Abdomen:  Soft, nontender, bowel sounds diminished Musculoskeletal:  Moves all extremities, no edema Skin:  Intact  LABS:  Lab 08/23/12 0330 08/22/12 0333 08/21/12 0229  HGB 12.1 13.4 16.3*  HCT 37.5 41.0 48.4*  WBC 5.0 8.9 13.4*  PLT 115* 113* 134*    Lab 08/23/12 0330 08/22/12 1810 08/22/12 0333 08/21/12 0229 08/20/12 1926  NA 148* 149* 146* 146* 149*  K 3.7 3.6 -- -- --  CL 117* 118* 115* 111 113*  CO2 22 22 21  18* 21  GLUCOSE 113* 109* 106* 113* 120*  BUN 23 22 28* 35* 30*  CREATININE 0.60 0.61 0.79 1.51* 1.22*  CALCIUM 8.9 8.7 8.7 8.4 8.5  MG 1.9 -- 2.1 -- 1.2*  PHOS 2.5 -- 2.1* -- 2.8    Lab 08/22/12 0350 08/21/12 0410 08/20/12 1929 08/20/12 1356  PHART 7.386 7.353 7.323* 7.342*  PCO2ART 39.2 36.1 44.0 43.9  PO2ART 89.5 80.7 144.0* 62.3*  HCO3 23.0  19.3* 21.7 23.2  TCO2 24.2 20.4 23.0 19.5  O2SAT 97.1 95.1 98.1 88.5    Lab 08/23/12 0827 08/23/12 0341 08/22/12 2348 08/22/12 1943 08/22/12 1545  GLUCAP 112* 110* 113* 89 90   IMAGING: 12/10  CXR >>> Hardware in good position, RUL airspace disease 12/10  Head CT >>> nad  ECG: 12/10  >>> Sinus tachycardia, ST elevations in anterolateral and inferior leads  DIAGNOSES: Active Problems:  Acute respiratory failure  Hypokalemia  COPD (chronic obstructive pulmonary disease)  Acute encephalopathy  Status epilepticus  Acute renal failure  Aspiration pneumonia  STEMI (ST elevation myocardial infarction)  Thrombocytopenia  Hypernatremia  Alcohol abuse  ASSESSMENT / PLAN:  PULMONARY  A:  Acute respiratory failure due to inability to protect airway.   Aspiration pneumonia.   COPD without  exacerbation. P:   Gaol SpO2>92, pH>7.30 Full mechanical support Daily SBT, not a candidate for extubation due to MS Trend ABG / CXR Combivent Holding Symbicort / Spiriva  CARDIOVASCULAR  A: STEMI in setting of Cocaine use.  Hypotension. P:  Seen by Cardiology - no plans for cath Heparin gtt, can likely stop on 12/14 (3 full days of rx) ASA Beta-blocker / ACEI >> start when BP able to tolerate Statin contraindicated - elevated transaminases   RENAL  A:  Acute renal failure >> resolved Hypernatremia.   Hypokalemia. P:   Trend BMP 1/2 NS, decrease to kvo   GASTROINTESTINAL  A:  Elevated transaminases - alcoholic pattern.   Hep C. Possible melena reported, stable Hgb P:   Check FOBT with reported dark stools, follow CBC NPO Start TF 12/13 Follow LFT  HEMATOLOGIC  A:  Thrombocytopenia, alcohol related? P:  Trend CBC  INFECTIOUS  A:  Aspiration pneumonia. P:   Unasyn day 4 Follow cx data  ENDOCRINE   A:  No active issues.  At risk for hypoglycemia. P:   CBG D5  NEUROLOGIC  A:  Seizure disorder.  Status epilepticus in setting of Cocaine use >> no further seizures noted Suspected alcohol abuse.   Acute encephalopathy.   Anxiety / depression / PTSD / OCD. R sided weakness >> ? Acute CVA that is not yet visualized on CT Head P:   Goal RASS 0 to -1 Staredt Precedex 12/12, titrate up Tegretol + Keppra  Thiamine / Folate Hold Xanax, Elavil, Cymbalta, Neurontin Exam concerning for ? CVA. Continue heparin and ASA, plan repeat Head CT scan 12/13 to compare with 12/10  CLINICAL SUMMARY: 56 yo found unresponsive next to empty Oxychodone bottle.  Seizure en route.  Intubated for airway protection.  Drug screen positive for cocaine.  STEMI, seen by Cardiology, not a candidate for emergent cardiac catheterization - Heparin / Aspirin.  Empirical treatment for aspiration pneumonia.  I have personally obtained a history, examined the patient, evaluated laboratory  and imaging results, formulated the assessment and plan and placed orders.  CRITICAL CARE:  The patient is critically ill with multiple organ systems failure and requires high complexity decision making for assessment and support, frequent evaluation and titration of therapies, application of advanced monitoring technologies and extensive interpretation of multiple databases. Critical Care Time devoted to patient care services described in this note is 30 minutes.   Leslye Peer., MD  Pulmonary and Critical Care Medicine [facility] Pager: [phone]  08/23/2012, 10:13 AM

## 2012-08-23 NOTE — Progress Notes (Signed)
Clinical Social Work  Patient remains on vent. CSW will continue to follow to complete assessment when appropriate.  [place], [place] [phone]

## 2012-08-23 NOTE — Progress Notes (Signed)
ANTICOAGULATION & ANTIBIOTIC CONSULT NOTE - Follow Up Consult  Pharmacy Consult for Heparin, Unasyn Indication: STEMI, ? aspiration PNA  Allergies  Allergen Reactions  . Corn-Containing Products Shortness Of Breath and Swelling    REACTION: affects breathing  . Eggs Or Egg-Derived Products Shortness Of Breath  . Latex Shortness Of Breath, Itching and Rash  . Codeine Itching and Nausea And Vomiting  . Penicillins Other (See Comments)    REACTION: "thrush"  . Sulfa Antibiotics Other (See Comments)    REACTION: Unknown    Patient Measurements: Height: 5' 0.24" (153 cm) Weight: 128 lb 4.9 oz (58.2 kg) IBW/kg (Calculated) : 46.04  Heparin Dosing Weight: 58 Kg  Vital Signs: BP: 88/57 mmHg (12/13 0800) Pulse Rate: 70  (12/13 0800)  Labs:  Basename 08/23/12 0330 08/22/12 1810 08/22/12 1210 08/22/12 0333 08/21/12 0904 08/21/12 0230 08/21/12 0229 08/20/12 1926 08/20/12 1359  HGB 12.1 -- -- 13.4 -- -- -- -- --  HCT 37.5 -- -- 41.0 -- -- 48.4* -- --  PLT 115* -- -- 113* -- -- 134* -- --  APTT -- -- -- -- -- -- -- -- 30  LABPROT -- -- -- -- -- -- -- -- 14.3  INR -- -- -- -- -- -- -- -- 1.13  HEPARINUNFRC 0.64 0.23* 0.30 -- -- -- -- -- --  CREATININE 0.60 0.61 -- 0.79 -- -- -- -- --  CKTOTAL -- -- -- -- -- -- -- -- 3339*  CKMB -- -- -- -- -- -- -- -- 60.5*  TROPONINI -- -- -- -- 3.86* 7.21* -- 7.47* --    Estimated Creatinine Clearance: 62.3 ml/min (by C-G formula based on Cr of 0.6).  Labs:  [facility] 08/23/12 0330 08/22/12 1810 08/22/12 0333 08/21/12 0229  WBC 5.0 -- 8.9 13.4*  HGB 12.1 -- 13.4 16.3*  PLT 115* -- 113* 134*  LABCREA -- -- -- --  CREATININE 0.60 0.61 0.79 --   Estimated Creatinine Clearance: 62.3 ml/min (by C-G formula based on Cr of 0.6). No results found for this basename: VANCOTROUGH:2,VANCOPEAK:2,VANCORANDOM:2,GENTTROUGH:2,GENTPEAK:2,GENTRANDOM:2,TOBRATROUGH:2,TOBRAPEAK:2,TOBRARND:2,AMIKACINPEAK:2,AMIKACINTROU:2,AMIKACIN:2, in the last 72 hours    Microbiology: Recent Results (from the past 720 hour(s))  CULTURE, BLOOD (ROUTINE X 2)     Status: Normal (Preliminary result)   Collection Time   08/20/12  1:59 PM      Component Value Range Status Comment   Specimen Description BLOOD RIGHT HAND   Final    Special Requests BOTTLES DRAWN AEROBIC AND ANAEROBIC 4CC   Final    Culture NO GROWTH 2 DAYS   Final    Report Status PENDING   Incomplete   URINE CULTURE     Status: Normal   Collection Time   08/20/12  2:03 PM      Component Value Range Status Comment   Specimen Description URINE, CLEAN CATCH   Final    Special Requests NONE   Final    Culture  Setup Time 08/20/2012 16:50   Final    Colony Count 9,000 COLONIES/ML   Final    Culture INSIGNIFICANT GROWTH   Final    Report Status 08/21/2012 FINAL   Final   CULTURE, BLOOD (ROUTINE X 2)     Status: Normal (Preliminary result)   Collection Time   08/20/12  2:23 PM      Component Value Range Status Comment   Specimen Description BLOOD LEFT ANTECUBITAL   Final    Special Requests BOTTLES DRAWN AEROBIC ONLY 4CC   Final    Culture NO  GROWTH 2 DAYS   Final    Report Status PENDING   Incomplete   MRSA PCR SCREENING     Status: Normal   Collection Time   08/20/12  7:22 PM      Component Value Range Status Comment   MRSA by PCR NEGATIVE  NEGATIVE Final     Anti-infectives     Start     Dose/Rate Route Frequency Ordered Stop   08/21/12 1600   vancomycin (VANCOCIN) 750 mg in sodium chloride 0.9 % 150 mL IVPB  Status:  Discontinued        750 mg 150 mL/hr over 60 Minutes Intravenous Every 24 hours 08/20/12 1815 08/20/12 2009   08/20/12 1900   Ampicillin-Sulbactam (UNASYN) 3 g in sodium chloride 0.9 % 100 mL IVPB        3 g 100 mL/hr over 60 Minutes Intravenous Every 8 hours 08/20/12 1807     08/20/12 1615   vancomycin (VANCOCIN) IVPB 1000 mg/200 mL premix        1,000 mg 200 mL/hr over 60 Minutes Intravenous  Once 08/20/12 1605 08/20/12 1725   08/20/12 1445   levofloxacin  (LEVAQUIN) IVPB 750 mg        750 mg 100 mL/hr over 90 Minutes Intravenous  Once 08/20/12 1444 08/20/12 1622         Assessment: Ms. Jacko continues on Heparin for STEMI in setting of cocaine use. Heparin level is therapeutic s/p increase in drip rate last PM. Noted FOBT is pending, RN reports only a small smear of stool this AM, which was not tested for blood. H/H have decreased from baseline, but now appear to be stable. Plts were low at baseline, continue to be low, but stable. No overt bleeding reported. Noted patient is on ASA. No BB d/t hypotension, and no statin d/t elevated LFTs.   Ms. Bocchino continue on Unasyn empirically for aspiration pna. She remains afebrile, and WBC is nml. Scr remains stable, normal, with UOP > 0.5 ml/kg/hr. Vanc 12/10 x1 Levo 12/10 x1 Unasyn 12/10 >>  12/10 BCx: ngtd 12/10 UCx : Insignificant growth 12/10 MRSA PCR: neg  Goal of Therapy:  Heparin level 0.3-0.7 units/ml Monitor platelets by anticoagulation protocol: Yes   Plan:  - Continue Heparin IV infusion at 1500 units/hr - f/up AM CBC, and heparin level - Continue Unasyn 3 gm IV q 6h. Please consider d/c on 12/17 after completing 8-day course.  Thanks, Malia Corsi K. Allena Katz, PharmD, BCPS.  Clinical Pharmacist Pager [phone]. 08/23/2012 8:40 AM

## 2012-08-24 ENCOUNTER — Inpatient Hospital Stay (HOSPITAL_COMMUNITY): Payer: Medicare Other

## 2012-08-24 DIAGNOSIS — I214 Non-ST elevation (NSTEMI) myocardial infarction: Secondary | ICD-10-CM

## 2012-08-24 DIAGNOSIS — E876 Hypokalemia: Secondary | ICD-10-CM

## 2012-08-24 DIAGNOSIS — J449 Chronic obstructive pulmonary disease, unspecified: Secondary | ICD-10-CM

## 2012-08-24 LAB — GLUCOSE, CAPILLARY: Glucose-Capillary: 132 mg/dL — ABNORMAL HIGH (ref 70–99)

## 2012-08-24 LAB — COMPREHENSIVE METABOLIC PANEL
ALT: 112 U/L — ABNORMAL HIGH (ref 0–35)
AST: 66 U/L — ABNORMAL HIGH (ref 0–37)
CO2: 21 mEq/L (ref 19–32)
Calcium: 9 mg/dL (ref 8.4–10.5)
Chloride: 119 mEq/L — ABNORMAL HIGH (ref 96–112)
Creatinine, Ser: 0.56 mg/dL (ref 0.50–1.10)
GFR calc Af Amer: >90 mL/min (ref >90)
GFR calc non Af Amer: >90 mL/min (ref >90)
Glucose, Bld: 127 mg/dL — ABNORMAL HIGH (ref 70–99)
Sodium: 151 mEq/L — ABNORMAL HIGH (ref 135–145)
Total Bilirubin: 0.2 mg/dL — ABNORMAL LOW (ref 0.3–1.2)

## 2012-08-24 LAB — CBC
HCT: 40 % (ref 36.0–46.0)
MCHC: 32.5 g/dL (ref 30.0–36.0)
MCV: 94.3 fL (ref 78.0–100.0)
Platelets: 120 10*3/uL — ABNORMAL LOW (ref 150–400)
RDW: 13.8 % (ref 11.5–15.5)

## 2012-08-24 LAB — HEPARIN LEVEL (UNFRACTIONATED): Heparin Unfractionated: 0.69 IU/mL (ref 0.30–0.70)

## 2012-08-24 MED ORDER — ALBUTEROL SULFATE (5 MG/ML) 0.5% IN NEBU
2.5000 mg | INHALATION_SOLUTION | Freq: Four times a day (QID) | RESPIRATORY_TRACT | Status: DC
  Administered 2012-08-24: 2.5 mg via RESPIRATORY_TRACT

## 2012-08-24 MED ORDER — MAGNESIUM SULFATE 40 MG/ML IJ SOLN
2.0000 g | Freq: Once | INTRAMUSCULAR | Status: AC
  Administered 2012-08-24: 2 g via INTRAVENOUS
  Filled 2012-08-24: qty 50

## 2012-08-24 MED ORDER — POTASSIUM CHLORIDE 10 MEQ/100ML IV SOLN
10.0000 meq | INTRAVENOUS | Status: AC
  Administered 2012-08-24 (×4): 10 meq via INTRAVENOUS
  Filled 2012-08-24 (×4): qty 100

## 2012-08-24 MED ORDER — SODIUM CHLORIDE 0.9 % IV SOLN
1000.0000 mg | Freq: Two times a day (BID) | INTRAVENOUS | Status: DC
  Administered 2012-08-24 – 2012-08-26 (×4): 1000 mg via INTRAVENOUS
  Filled 2012-08-24 (×5): qty 10

## 2012-08-24 MED ORDER — IPRATROPIUM BROMIDE 0.02 % IN SOLN
0.5000 mg | Freq: Four times a day (QID) | RESPIRATORY_TRACT | Status: DC
  Administered 2012-08-24: 0.5 mg via RESPIRATORY_TRACT

## 2012-08-24 MED ORDER — LEVETIRACETAM 100 MG/ML PO SOLN
1000.0000 mg | Freq: Two times a day (BID) | ORAL | Status: DC
  Filled 2012-08-24: qty 10

## 2012-08-24 MED ORDER — HEPARIN SODIUM (PORCINE) 5000 UNIT/ML IJ SOLN
5000.0000 [IU] | Freq: Three times a day (TID) | INTRAMUSCULAR | Status: DC
  Administered 2012-08-24 – 2012-09-10 (×48): 5000 [IU] via SUBCUTANEOUS
  Filled 2012-08-24 (×54): qty 1

## 2012-08-24 MED ORDER — FOLIC ACID 5 MG/ML IJ SOLN
1.0000 mg | Freq: Every day | INTRAMUSCULAR | Status: DC
  Filled 2012-08-24: qty 0.2

## 2012-08-24 MED ORDER — IPRATROPIUM BROMIDE 0.02 % IN SOLN
RESPIRATORY_TRACT | Status: AC
  Filled 2012-08-24: qty 2.5

## 2012-08-24 MED ORDER — ALBUTEROL SULFATE (5 MG/ML) 0.5% IN NEBU: 2.5000 mg | INHALATION_SOLUTION | Freq: Four times a day (QID) | RESPIRATORY_TRACT | Status: DC

## 2012-08-24 MED ORDER — THIAMINE HCL 100 MG/ML IJ SOLN
100.0000 mg | Freq: Every day | INTRAMUSCULAR | Status: DC
  Administered 2012-08-24: 100 mg via INTRAVENOUS
  Filled 2012-08-24 (×2): qty 1

## 2012-08-24 MED ORDER — POTASSIUM CHLORIDE 20 MEQ/15ML (10%) PO LIQD: 60.0000 meq | Freq: Once | ORAL | Status: DC

## 2012-08-24 MED ORDER — DEXTROSE 5 % IV SOLN
INTRAVENOUS | Status: DC
  Administered 2012-08-24 – 2012-08-26 (×5): via INTRAVENOUS

## 2012-08-24 MED ORDER — THIAMINE HCL 100 MG/ML IJ SOLN
100.0000 mg | Freq: Every day | INTRAMUSCULAR | Status: DC
  Filled 2012-08-24: qty 1

## 2012-08-24 MED ORDER — HALOPERIDOL LACTATE 5 MG/ML IJ SOLN
5.0000 mg | Freq: Four times a day (QID) | INTRAMUSCULAR | Status: DC | PRN
  Administered 2012-08-24 – 2012-08-26 (×8): 5 mg via INTRAVENOUS
  Filled 2012-08-24 (×9): qty 1

## 2012-08-24 MED ORDER — ALBUTEROL SULFATE (5 MG/ML) 0.5% IN NEBU
INHALATION_SOLUTION | RESPIRATORY_TRACT | Status: AC
  Filled 2012-08-24: qty 0.5

## 2012-08-24 MED ORDER — VITAMIN B-1 100 MG PO TABS
100.0000 mg | ORAL_TABLET | Freq: Every day | ORAL | Status: DC
  Filled 2012-08-24: qty 1

## 2012-08-24 MED ORDER — ALBUTEROL SULFATE (5 MG/ML) 0.5% IN NEBU: 2.5000 mg | INHALATION_SOLUTION | RESPIRATORY_TRACT | Status: DC

## 2012-08-24 MED ORDER — DILTIAZEM HCL 25 MG/5ML IV SOLN
10.0000 mg | Freq: Once | INTRAVENOUS | Status: AC
  Administered 2012-08-24: 10 mg via INTRAVENOUS
  Filled 2012-08-24: qty 5

## 2012-08-24 NOTE — Progress Notes (Signed)
PULMONARY  / CRITICAL CARE MEDICINE  Name: Donna [name] MRN: [id number] DOB: Dec 20, 1955    LOS: 4  REFERRING MD:  Transfer from [facility]  CHIEF COMPLAINT:  Unresponsive  BRIEF PATIENT DESCRIPTION: 56 yo found unresponsive next to empty Oxycodone bottle.  Seizure en route.  Intubated for airway protection.  Drug screen positive for cocaine.  STEMI, seen by Cardiology, not a candidate for emergent cardiac catheterization.  LINES / TUBES: 12/10  OETT >>> 12/14 12/10  OGT >>> 12/14 12/10  Foley   CULTURES: 12/10  Blood >>> 12/10  Urine >>> neg  ANTIBIOTICS: Levaquin 12/10 x 1 Vancomycin 12/10 x 1 Unasyn 12/10 >>> 12/14  SIGNIFICANT EVENTS:  12/10  Admitted with cocaine induced seizure / STEMI.  Intubated for airway protection. 12/13  Right-sided weakness - head CT negative 12/14  Self extubated  LEVEL OF CARE:  ICU  PRIMARY SERVICE:  PCCM  CONSULTANTS:  Cardiology  CODE STATUS:  Full  DIET:  NPO  DVT Px:  Heparin [place] started as gtt d/c'd  GI Px:  Protonix d/c'd as extubated  INTERVAL HISTORY:  Self extubated, remains confused and agitated, saturating well on [place]  VITAL SIGNS: Temp:  [97 F (36.1 C)-98.2 F (36.8 C)] 98.1 F (36.7 C) (12/14 0700) Pulse Rate:  [53-89] 77  (12/14 0700) Resp:  [14-25] 14  (12/14 0700) BP: (88-125)/(51-91) 125/82 mmHg (12/14 0700) SpO2:  [99 %-100 %] 100 % (12/14 0700) FiO2 (%):  [40 %] 40 % (12/14 0500) Weight:  [62.1 kg (136 lb 14.5 oz)] 62.1 kg (136 lb 14.5 oz) (12/14 0400)  HEMODYNAMICS:    VENTILATOR SETTINGS: Vent Mode:  [-] PRVC FiO2 (%):  [40 %] 40 % Set Rate:  [15 bmp] 15 bmp Vt Set:  [500 mL] 500 mL PEEP:  [5 cmH20] 5 cmH20 Plateau Pressure:  [14 cmH20-16 cmH20] 14 cmH20  INTAKE / OUTPUT: Intake/Output      12/13 0701 - 12/14 0700 12/14 0701 - 12/15 0700   I.V. (mL/kg) 809.6 (13)    NG/GT 1100    IV Piggyback 700    Total Intake(mL/kg) 2609.6 (42)    Urine (mL/kg/hr) 521 (0.3)    Total Output 521    Net  +2088.6         Stool Occurrence 4 x      PHYSICAL EXAMINATION: General:  Appears chronically ill Neuro:  Encephalopathic, no focal, no focal weakness at this time HEENT:  PERRL Cardiovascular:  RRR, no murmurs Lungs:  Bilateral diminished air entry, no w/r/r Abdomen:  Soft, nontender, bowel sounds diminished Musculoskeletal:  Moves all extremities, no edema Skin:  Intact  LABS:  Lab 08/24/12 0445 08/23/12 0330 08/22/12 1810 08/22/12 0350 08/22/12 0333 08/21/12 0904 08/21/12 0410 08/21/12 0230 08/21/12 0229 08/20/12 1929 08/20/12 1926 08/20/12 1418 08/20/12 1359 08/20/12 1356  HGB 13.0 12.1 -- -- 13.4 -- -- -- 16.3* -- 16.8* -- -- --  WBC 5.8 5.0 -- -- 8.9 -- -- -- 13.4* -- 15.7* -- -- --  PLT 120* 115* -- -- 113* -- -- -- -- -- -- -- -- --  NA 151* 148* 149* -- 146* -- -- -- 146* -- -- -- -- --  K 3.2* 3.7 -- -- -- -- -- -- -- -- -- -- -- --  CL 119* 117* 118* -- 115* -- -- -- 111 -- -- -- -- --  CO2 21 22 22  -- 21 -- -- -- 18* -- -- -- -- --  GLUCOSE 127*  113* 109* -- 106* -- -- -- 113* -- -- -- -- --  BUN 22 23 22  -- 28* -- -- -- 35* -- -- -- -- --  CREATININE 0.56 0.60 0.61 -- 0.79 -- -- -- 1.51* -- -- -- -- --  CALCIUM 9.0 8.9 8.7 -- 8.7 -- -- -- 8.4 -- -- -- -- --  MG 1.6 1.9 -- -- 2.1 -- -- -- -- -- 1.2* -- -- --  PHOS 2.4 2.5 -- -- 2.1* -- -- -- -- -- 2.8 -- -- --  AST 66* -- -- -- -- -- -- -- -- -- -- -- 215* --  ALT 112* -- -- -- -- -- -- -- -- -- -- -- 78* --  ALKPHOS 78 -- -- -- -- -- -- -- -- -- -- -- 73 --  BILITOT 0.2* -- -- -- -- -- -- -- -- -- -- -- 0.4 --  PROT 5.1* -- -- -- -- -- -- -- -- -- -- -- 7.0 --  ALBUMIN 2.5* -- -- -- -- -- -- -- -- -- -- -- 4.0 --  INR -- -- -- -- -- -- -- -- -- -- -- -- 1.13 --  APTT -- -- -- -- -- -- -- -- -- -- -- -- 30 --  INR -- -- -- -- -- -- -- -- -- -- -- -- 1.13 --  LATICACIDVEN -- -- -- -- -- -- -- -- -- -- -- 1.6 -- --  TROPONINI -- -- -- -- -- 3.86* -- 7.21* -- -- 7.47* -- 13.95* --  PHART -- -- -- 7.386 -- -- 7.353  -- -- 7.323* -- -- -- 7.342*  PCO2ART -- -- -- 39.2 -- -- 36.1 -- -- 44.0 -- -- -- 43.9  PO2ART -- -- -- 89.5 -- -- 80.7 -- -- 144.0* -- -- -- 62.3*  HCO3 -- -- -- 23.0 -- -- 19.3* -- -- 21.7 -- -- -- 23.2  O2SAT -- -- -- 97.1 -- -- 95.1 -- -- 98.1 -- -- -- 88.5    Lab 08/24/12 0334 08/23/12 1934 08/23/12 1537 08/23/12 0827 08/23/12 0341  GLUCAP 137* 133* 145* 112* 110*   IMAGING: 12/10  CXR >>> Hardware in good position, RUL airspace disease 12/10  Head CT >>> nad 12/13  Head CT >>> nad 12/13  CXR >>> Stable support apparatus, bilateral airspace disease  ECG: 12/10  >>> Sinus tachycardia, ST elevations in anterolateral and inferior leads  DIAGNOSES: Active Problems:  Acute respiratory failure  Hypokalemia  COPD (chronic obstructive pulmonary disease)  Acute encephalopathy  Status epilepticus  Acute renal failure  Aspiration pneumonia  STEMI (ST elevation myocardial infarction)  Thrombocytopenia  Hypernatremia  Alcohol abuse  ASSESSMENT / PLAN:  PULMONARY  A:  Acute respiratory failure due to inability to protect airway - resoled - self extubated 12/14, doing well on 2L [place].  No stridor.  Aspiration pneumonitis, less likely pneumonia. COPD without exacerbation. P:   Gaol SpO2>92 Supplemental oxygen Albuterol / Atrovent PRN Holding Symbicort / Spiriva  CARDIOVASCULAR  A: STEMI in setting of Cocaine use.  Hypotension - resolved. P:  Seen by Cardiology - no plans for cath Stop Heparin gtt as marginal benefit past 48 hours ASA Beta-blocker contraindicated - active Cocaine use Statin contraindicated - elevated transaminases   RENAL  A:  Acute renal failure - resolved. Hypernatremia.  Hypokalemia.  Hypomagnesemia. P:   Trend BMP Change IVF to D5@150  Magnesium 2 IV KCl 4 IV  GASTROINTESTINAL  A:  Elevated transaminases - alcoholic pattern - improving.  Hep C. Possible melena reported, stable Hgb, FOBT neg. P:   NPO Swallow evaluation in  AM  HEMATOLOGIC  A:  Thrombocytopenia, alcohol related? P:  Trend CBC  INFECTIOUS  A:  Aspiration pneumonitis, less likely pneumonia as low PCT and normalized WBC. P:   D/c Unasyn  ENDOCRINE   A:  No active issues.  Normoglycemic. P:   D/c CBG  NEUROLOGIC  A:  Seizure disorder.  Status epilepticus in setting of Cocaine use >> no further seizures noted. Suspected alcohol abuse.  Acute encephalopathy.  Anxiety / depression / PTSD / OCD.  R sided weakness improved, nad on repeat CT. P:   D/c Precedex as no significant effect and now extubated Tegretol + Keppra  Thiamine / Folate change to IV Hold Xanax, Elavil, Cymbalta, Neurontin Consider MRI when stable  CLINICAL SUMMARY: 56 yo found unresponsive next to empty Oxychodone bottle.  Seizure en route.  Intubated for airway protection.  Drug screen positive for cocaine.  STEMI, seen by Cardiology, not a candidate for emergent cardiac catheterization.  Heparin completed.  ASA continued.  Beta blockers / statins contraindicated.  Antibiotics d/c'd as more likely pneumonitis than pneumonia.  Self extubated 12/14, doing well.  Acute encephalopathy continues - did not respond to Precedex, will use haldol instead.  I have personally obtained a history, examined the patient, evaluated laboratory and imaging results, formulated the assessment and plan and placed orders.  CRITICAL CARE:  The patient is critically ill with multiple organ systems failure and requires high complexity decision making for assessment and support, frequent evaluation and titration of therapies, application of advanced monitoring technologies and extensive interpretation of multiple databases. Critical Care Time devoted to patient care services described in this note is 35 minutes.   Lonia Farber, MD  Pulmonary and Critical Care Medicine [facility] Pager: [phone]  08/24/2012, 7:34 AM

## 2012-08-24 NOTE — Progress Notes (Signed)
eLink Physician-Brief Progress Note Patient Name: Donna [name] DOB: March 23, 1956 MRN: [id number]  Date of Service  08/24/2012   HPI/Events of Note  Call from nurse reporting that despite sedation patient has been found OOB while vented.  Has also been standing up in the bed.  Has received prn sedation along with cont precedex and currently in bed resting, HD stable.   eICU Interventions  Plan: [place]y belt for patient safety Rounding team to address additional sedation needs.   Intervention Category Intermediate Interventions: Other: (patient safety)  DETERDING,ELIZABETH 08/24/2012, 6:02 AM

## 2012-08-24 NOTE — Progress Notes (Signed)
[facility] ADULT ICU REPLACEMENT PROTOCOL FOR AM LAB REPLACEMENT ONLY  The patient does not apply for the [facility] Adult ICU Electrolyte Replacment Protocol based on the criteria listed below:   2. Is urine output >/= 0.5 ml/kg/hr for the last 8 hours? no Patient's UOP is  0.46ml/kg/hr 4. Abnormal electrolyte(s)Potassium 3.2  6. If a panic level lab has been reported, has the CCM MD in charge been notified? yes.   Physician:  Dr. Arvil Persons, Jaxsin Bottomley P 08/24/2012 5:31 AM

## 2012-08-24 NOTE — Progress Notes (Signed)
eLink Physician-Brief Progress Note Patient Name: Donna [name] DOB: [birth year] MRN: [id number]  Date of Service  08/24/2012   HPI/Events of Note  Hypokalemia   eICU Interventions  Potassium replaced   Intervention Category Intermediate Interventions: Electrolyte abnormality - evaluation and management  DETERDING,ELIZABETH 08/24/2012, 5:46 AM

## 2012-08-24 NOTE — Progress Notes (Signed)
eLink Physician-Brief Progress Note Patient Name: Donna [name] DOB: [birth year] MRN: [id number]  Date of Service  08/24/2012   HPI/Events of Note  Hypertensive, tachy On metoprolol -home med   eICU Interventions  Avoiding bb here (cocaine cardizem IV x 1 Haldol for agitation   Intervention Category Intermediate Interventions: Hypertension - evaluation and management  [name] V. 08/24/2012, 6:54 PM

## 2012-08-24 NOTE — Procedures (Signed)
Extubation Procedure Note  Patient Details:   Name: Donna [name] DOB: 06/08/1956 MRN: [id number]   Airway Documentation:     Evaluation  O2 sats: Pt self extubated. Complications: No apparent complications Patient did tolerate procedure well. Bilateral Breath Sounds: Rhonchi Suctioning: Airway Yes  Bishop Dublin 08/24/2012, 6:58 AM

## 2012-08-25 LAB — CBC
Hemoglobin: 14.5 g/dL (ref 12.0–15.0)
MCH: 31 pg (ref 26.0–34.0)
MCHC: 33.2 g/dL (ref 30.0–36.0)
RDW: 13.3 % (ref 11.5–15.5)

## 2012-08-25 LAB — CULTURE, BLOOD (ROUTINE X 2)

## 2012-08-25 LAB — BASIC METABOLIC PANEL
BUN: 6 mg/dL (ref 6–23)
Calcium: 9 mg/dL (ref 8.4–10.5)
GFR calc Af Amer: >90 mL/min (ref >90)
GFR calc non Af Amer: >90 mL/min (ref >90)
Glucose, Bld: 104 mg/dL — ABNORMAL HIGH (ref 70–99)
Sodium: 141 mEq/L (ref 135–145)

## 2012-08-25 MED ORDER — HALOPERIDOL LACTATE 5 MG/ML IJ SOLN
5.0000 mg | Freq: Once | INTRAMUSCULAR | Status: AC
  Administered 2012-08-25: 5 mg via INTRAVENOUS

## 2012-08-25 MED ORDER — POTASSIUM CHLORIDE 10 MEQ/100ML IV SOLN
10.0000 meq | INTRAVENOUS | Status: AC
  Administered 2012-08-25 (×4): 10 meq via INTRAVENOUS
  Filled 2012-08-25: qty 100
  Filled 2012-08-25: qty 200

## 2012-08-25 MED ORDER — VITAMIN B-1 100 MG PO TABS
100.0000 mg | ORAL_TABLET | Freq: Every day | ORAL | Status: DC
  Administered 2012-08-25 – 2012-08-27 (×3): 100 mg via ORAL
  Filled 2012-08-25 (×4): qty 1

## 2012-08-25 MED ORDER — AMITRIPTYLINE HCL 25 MG PO TABS
25.0000 mg | ORAL_TABLET | Freq: Every day | ORAL | Status: DC
  Administered 2012-08-25 – 2012-09-09 (×14): 25 mg via ORAL
  Filled 2012-08-25 (×17): qty 1

## 2012-08-25 MED ORDER — POTASSIUM CHLORIDE 20 MEQ/15ML (10%) PO LIQD
40.0000 meq | Freq: Once | ORAL | Status: AC
  Administered 2012-08-25: 40 meq
  Filled 2012-08-25: qty 15

## 2012-08-25 MED ORDER — ALBUTEROL SULFATE (5 MG/ML) 0.5% IN NEBU
2.5000 mg | INHALATION_SOLUTION | RESPIRATORY_TRACT | Status: DC
  Administered 2012-08-25 – 2012-08-26 (×10): 2.5 mg via RESPIRATORY_TRACT
  Filled 2012-08-25 (×9): qty 0.5

## 2012-08-25 MED ORDER — FOLIC ACID 1 MG PO TABS
1.0000 mg | ORAL_TABLET | Freq: Every day | ORAL | Status: DC
  Administered 2012-08-25 – 2012-09-10 (×17): 1 mg via ORAL
  Filled 2012-08-25 (×17): qty 1

## 2012-08-25 MED ORDER — IPRATROPIUM BROMIDE 0.02 % IN SOLN
0.5000 mg | RESPIRATORY_TRACT | Status: DC
  Administered 2012-08-25 – 2012-08-26 (×8): 0.5 mg via RESPIRATORY_TRACT
  Filled 2012-08-25 (×9): qty 2.5

## 2012-08-25 NOTE — Progress Notes (Signed)
Per MD order QTC checked. Result 0.44

## 2012-08-25 NOTE — Progress Notes (Signed)
Agitation  Haldol one dose 5 mg given

## 2012-08-25 NOTE — Plan of Care (Signed)
Problem: Phase I Progression Outcomes Goal: Patient tolerating nututrition at goal Outcome: Not Progressing Pt remains NPO at this time for swallowing evaluation Goal: Voiding-avoid urinary catheter unless indicated Outcome: Not Progressing Pt remains confused, incont.

## 2012-08-25 NOTE — Progress Notes (Signed)
Patient having increased agiation, Haldol 5mg  given.  QTC 451.  Will continue to monitor  BP 119/95  Pulse 109  Temp 98.5 F (36.9 C) (Other (Comment))  Resp 25  Ht 5' 0.24" (1.53 m)  Wt 62.3 kg (137 lb 5.6 oz)  BMI 26.61 kg/m2  SpO2 99%

## 2012-08-25 NOTE — Progress Notes (Signed)
PULMONARY  / CRITICAL CARE MEDICINE  Name: Donna [name] MRN: [id number] DOB: [birth year]    LOS: 5  REFERRING MD:  Transfer from [facility]  CHIEF COMPLAINT:  Unresponsive  BRIEF PATIENT DESCRIPTION:  56 yo found unresponsive next to empty Oxycodone bottle.  Seizure en route.  Intubated for airway protection.  Drug screen positive for Cocaine.  STEMI, seen by Cardiology, not a candidate for emergent cardiac catheterization.  LINES / TUBES: 12/10  OETT >>> 12/14 12/10  OGT >>> 12/14 12/10  Foley >>> 12/14  NGT >>>  CULTURES: 12/10  Blood >>> 12/10  Urine >>> neg  ANTIBIOTICS: Levaquin 12/10 x 1 Vancomycin 12/10 x 1 Unasyn 12/10 >>> 12/14  SIGNIFICANT EVENTS:  12/10  Admitted with cocaine induced seizure / STEMI.  Intubated for airway protection. 12/13  Right-sided weakness - head CT negative 12/14  Self extubated  LEVEL OF CARE:  ICU  PRIMARY SERVICE:  PCCM  CONSULTANTS:  Cardiology  CODE STATUS:  Full  DIET:  NPO  DVT Px:  Heparin [place]   GI Px:  Not indicated  INTERVAL HISTORY:  Intermittently agitated - treated with Haldol.  Tachycardia - Cardizem given.  VITAL SIGNS: Temp:  [98 F (36.7 C)-99.6 F (37.6 C)] 98.6 F (37 C) (12/15 0700) Pulse Rate:  [88-131] 102  (12/15 0700) Resp:  [17-34] 26  (12/15 0700) BP: (137-201)/(67-170) 155/93 mmHg (12/15 0700) SpO2:  [91 %-100 %] 99 % (12/15 0700) Weight:  [62.3 kg (137 lb 5.6 oz)] 62.3 kg (137 lb 5.6 oz) (12/15 0500)  HEMODYNAMICS:   VENTILATOR SETTINGS:   INTAKE / OUTPUT: Intake/Output      12/14 0701 - 12/15 0700 12/15 0701 - 12/16 0700   I.V. (mL/kg) 2482.5 (39.8)    NG/GT     IV Piggyback 560    Total Intake(mL/kg) 3042.5 (48.8)    Urine (mL/kg/hr) 3725 (2.5)    Total Output 3725    Net -682.5          PHYSICAL EXAMINATION: General:  Appears chronically ill, no distress Neuro:  Encephalopathic, no focal, no focal weakness, follows some commands HEENT:  PERRL Cardiovascular:  RRR, no  murmurs Lungs:  Bilateral diminished air entry, few scattered rhonchi Abdomen:  Soft, nontender, bowel sounds diminished Musculoskeletal:  Moves all extremities, no edema Skin:  Intact  LABS:  Lab 08/25/12 0445 08/24/12 0445 08/23/12 0330 08/22/12 1810 08/22/12 0350 08/22/12 0333 08/21/12 0904 08/21/12 0410 08/21/12 0230 08/21/12 0229 08/20/12 1929 08/20/12 1926 08/20/12 1418 08/20/12 1359 08/20/12 1356  HGB 14.5 13.0 12.1 -- -- 13.4 -- -- -- 16.3* -- -- -- -- --  WBC 6.8 5.8 5.0 -- -- 8.9 -- -- -- 13.4* -- -- -- -- --  PLT 148* 120* 115* -- -- -- -- -- -- -- -- -- -- -- --  NA 141 151* 148* 149* -- 146* -- -- -- -- -- -- -- -- --  K 3.0* 3.2* -- -- -- -- -- -- -- -- -- -- -- -- --  CL 105 119* 117* 118* -- 115* -- -- -- -- -- -- -- -- --  CO2 26 21 22 22  -- 21 -- -- -- -- -- -- -- -- --  GLUCOSE 104* 127* 113* 109* -- 106* -- -- -- -- -- -- -- -- --  BUN 6 22 23 22  -- 28* -- -- -- -- -- -- -- -- --  CREATININE 0.50 0.56 0.60 0.61 -- 0.79 -- -- -- -- -- -- -- -- --  CALCIUM 9.0 9.0 8.9 8.7 -- 8.7 -- -- -- -- -- -- -- -- --  MG -- 1.6 1.9 -- -- 2.1 -- -- -- -- -- 1.2* -- -- --  PHOS -- 2.4 2.5 -- -- 2.1* -- -- -- -- -- 2.8 -- -- --  AST -- 66* -- -- -- -- -- -- -- -- -- -- -- 215* --  ALT -- 112* -- -- -- -- -- -- -- -- -- -- -- 78* --  ALKPHOS -- 78 -- -- -- -- -- -- -- -- -- -- -- 73 --  BILITOT -- 0.2* -- -- -- -- -- -- -- -- -- -- -- 0.4 --  PROT -- 5.1* -- -- -- -- -- -- -- -- -- -- -- 7.0 --  ALBUMIN -- 2.5* -- -- -- -- -- -- -- -- -- -- -- 4.0 --  INR -- -- -- -- -- -- -- -- -- -- -- -- -- 1.13 --  APTT -- -- -- -- -- -- -- -- -- -- -- -- -- 30 --  INR -- -- -- -- -- -- -- -- -- -- -- -- -- 1.13 --  LATICACIDVEN -- -- -- -- -- -- -- -- -- -- -- -- 1.6 -- --  TROPONINI -- -- -- -- -- -- 3.86* -- 7.21* -- -- 7.47* -- 13.95* --  PHART -- -- -- -- 7.386 -- -- 7.353 -- -- 7.323* -- -- -- 7.342*  PCO2ART -- -- -- -- 39.2 -- -- 36.1 -- -- 44.0 -- -- -- 43.9  PO2ART -- -- -- -- 89.5  -- -- 80.7 -- -- 144.0* -- -- -- 62.3*  HCO3 -- -- -- -- 23.0 -- -- 19.3* -- -- 21.7 -- -- -- 23.2  O2SAT -- -- -- -- 97.1 -- -- 95.1 -- -- 98.1 -- -- -- 88.5    Lab 08/24/12 0707 08/24/12 0334 08/23/12 2345 08/23/12 1934 08/23/12 1537  GLUCAP 132* 137* 125* 133* 145*   IMAGING: 12/10  CXR >>> Hardware in good position, RUL airspace disease 12/10  Head CT >>> nad 12/13  Head CT >>> nad 12/13  CXR >>> Stable support apparatus, bilateral airspace disease  ECG: 12/10  >>> Sinus tachycardia, ST elevations in anterolateral and inferior leads  DIAGNOSES: Active Problems:  Acute respiratory failure  Hypokalemia  COPD (chronic obstructive pulmonary disease)  Acute encephalopathy  Status epilepticus  Acute renal failure  Aspiration pneumonia  STEMI (ST elevation myocardial infarction)  Thrombocytopenia  Hypernatremia  Alcohol abuse  ASSESSMENT / PLAN:  PULMONARY  A:  Acute respiratory failure due to inability to protect airway - resoled - self extubated 12/14, doing well on 2L [place].  No stridor.  Aspiration pneumonitis, less likely pneumonia. COPD without exacerbation. P:   Gaol SpO2>92 Supplemental oxygen Albuterol / Atrovent PRN, will add scheduled Holding Symbicort / Spiriva due to encephalopathy  CARDIOVASCULAR  A: STEMI in setting of Cocaine use.  Hypotension - resolved.  Intermittent tachycardia - likely related to agitation. P:  Seen by Cardiology - no plans for cath ASA Beta-blocker contraindicated - active Cocaine use Statin contraindicated - elevated transaminases   RENAL  A:  Acute renal failure - resolved. Hypernatremia - resolved.  Hypokalemia. P:   Trend BMP Change IVF to D5@75  KCl 40 IV given by eMD Add KCl 40 PO x 1  GASTROINTESTINAL  A:  Elevated transaminases - alcoholic pattern - improving.  Hep C. Possible melena reported, stable Hgb, FOBT  neg. P:   NPO NGT Swallow evaluation today, if failed would start TF  HEMATOLOGIC  A:   Thrombocytopenia, alcohol related? P:  Trend CBC  INFECTIOUS  A:  Aspiration pneumonitis, less likely pneumonia as low PCT and normalized WBC. P:   Monitor  ENDOCRINE   A:  No active issues.  Normoglycemic. P:   Monitor  NEUROLOGIC  A:  Seizure disorder.  Status epilepticus in setting of Cocaine use >> no further seizures noted. Suspected alcohol abuse.  Acute encephalopathy - slowly improving.  Anxiety / depression / PTSD / OCD.  R sided weakness improved, nad on repeat CT. P:   Tegretol + Keppra  Thiamine / Folate change to PO Reintroduce preadmission Elavil Hold Xanax, Neurontin, Cymbalta  CLINICAL SUMMARY: 56 yo found unresponsive next to empty Oxychodone bottle.  Seizure en route.  Intubated for airway protection.  Drug screen positive for cocaine.  STEMI, seen by Cardiology, not a candidate for emergent cardiac catheterization.  Heparin completed.  ASA continued.  Beta blockers / statins contraindicated.  Antibiotics d/c'd as more likely pneumonitis than pneumonia.  Self extubated 12/14, doing well.  Acute encephalopathy continues - did not respond to Precedex, will use haldol instead.  Elavil reintroduced.  Likely to downgrade to SDU this afternoon.  I have personally obtained a history, examined the patient, evaluated laboratory and imaging results, formulated the assessment and plan and placed orders.  Lonia Farber, MD  Pulmonary and Critical Care Medicine [facility] Pager: [phone]  08/25/2012, 7:29 AM

## 2012-08-26 DIAGNOSIS — B1710 Acute hepatitis C without hepatic coma: Secondary | ICD-10-CM

## 2012-08-26 LAB — GLUCOSE, CAPILLARY
Glucose-Capillary: 82 mg/dL (ref 70–99)
Glucose-Capillary: 97 mg/dL (ref 70–99)

## 2012-08-26 LAB — BASIC METABOLIC PANEL
Calcium: 9.1 mg/dL (ref 8.4–10.5)
GFR calc Af Amer: >90 mL/min (ref >90)
GFR calc non Af Amer: >90 mL/min (ref >90)
GFR calc non Af Amer: >90 mL/min (ref >90)
Potassium: 3.2 mEq/L — ABNORMAL LOW (ref 3.5–5.1)
Sodium: 135 mEq/L (ref 135–145)
Sodium: 135 mEq/L (ref 135–145)

## 2012-08-26 LAB — CBC
MCH: 31 pg (ref 26.0–34.0)
MCHC: 34.4 g/dL (ref 30.0–36.0)
Platelets: 147 10*3/uL — ABNORMAL LOW (ref 150–400)
RBC: 5 MIL/uL (ref 3.87–5.11)

## 2012-08-26 MED ORDER — LEVETIRACETAM 500 MG PO TABS
1000.0000 mg | ORAL_TABLET | Freq: Two times a day (BID) | ORAL | Status: DC
  Administered 2012-08-26 – 2012-09-10 (×28): 1000 mg via ORAL
  Filled 2012-08-26 (×31): qty 2

## 2012-08-26 MED ORDER — POTASSIUM CHLORIDE 20 MEQ/15ML (10%) PO LIQD
40.0000 meq | ORAL | Status: AC
  Administered 2012-08-26 (×2): 40 meq
  Filled 2012-08-26 (×2): qty 30

## 2012-08-26 MED ORDER — DULOXETINE HCL 30 MG PO CPEP
30.0000 mg | ORAL_CAPSULE | Freq: Two times a day (BID) | ORAL | Status: DC
  Administered 2012-08-27 – 2012-09-10 (×27): 30 mg via ORAL
  Filled 2012-08-26 (×32): qty 1

## 2012-08-26 MED ORDER — IPRATROPIUM BROMIDE 0.02 % IN SOLN
0.5000 mg | RESPIRATORY_TRACT | Status: DC | PRN
  Administered 2012-08-26: 0.5 mg via RESPIRATORY_TRACT

## 2012-08-26 MED ORDER — JEVITY 1.2 CAL PO LIQD
1000.0000 mL | ORAL | Status: DC
  Administered 2012-08-26: 1000 mL
  Filled 2012-08-26 (×3): qty 1000

## 2012-08-26 MED ORDER — ALBUTEROL SULFATE (5 MG/ML) 0.5% IN NEBU
INHALATION_SOLUTION | RESPIRATORY_TRACT | Status: AC
  Filled 2012-08-26: qty 0.5

## 2012-08-26 MED ORDER — MIDAZOLAM HCL 2 MG/2ML IJ SOLN
1.0000 mg | INTRAMUSCULAR | Status: DC | PRN
  Administered 2012-08-26 – 2012-08-27 (×2): 1 mg via INTRAVENOUS
  Filled 2012-08-26 (×2): qty 2

## 2012-08-26 MED ORDER — ALBUTEROL SULFATE (5 MG/ML) 0.5% IN NEBU: 2.5000 mg | INHALATION_SOLUTION | RESPIRATORY_TRACT | Status: DC

## 2012-08-26 MED ORDER — MIDAZOLAM HCL 2 MG/2ML IJ SOLN
1.0000 mg | Freq: Once | INTRAMUSCULAR | Status: AC
Start: 2012-08-26 — End: 2012-08-26
  Administered 2012-08-26: 1 mg via INTRAVENOUS

## 2012-08-26 MED ORDER — IPRATROPIUM BROMIDE 0.02 % IN SOLN
RESPIRATORY_TRACT | Status: AC
  Filled 2012-08-26: qty 2.5

## 2012-08-26 MED ORDER — MIDAZOLAM HCL 2 MG/2ML IJ SOLN
1.0000 mg | Freq: Once | INTRAMUSCULAR | Status: AC
  Administered 2012-08-26: 1 mg via INTRAVENOUS

## 2012-08-26 MED ORDER — MIDAZOLAM HCL 2 MG/2ML IJ SOLN
INTRAMUSCULAR | Status: AC
  Administered 2012-08-26: 1 mg via INTRAVENOUS
  Filled 2012-08-26: qty 2

## 2012-08-26 MED ORDER — GABAPENTIN 100 MG PO CAPS
100.0000 mg | ORAL_CAPSULE | Freq: Three times a day (TID) | ORAL | Status: DC
  Administered 2012-08-26 – 2012-09-10 (×43): 100 mg via ORAL
  Filled 2012-08-26 (×47): qty 1

## 2012-08-26 MED ORDER — ALBUTEROL SULFATE (5 MG/ML) 0.5% IN NEBU: 2.5000 mg | INHALATION_SOLUTION | RESPIRATORY_TRACT | Status: DC | PRN

## 2012-08-26 MED ORDER — OSMOLITE 1.2 CAL PO LIQD
1000.0000 mL | ORAL | Status: DC
  Filled 2012-08-26 (×2): qty 1000

## 2012-08-26 MED ORDER — IPRATROPIUM BROMIDE 0.02 % IN SOLN: 0.5000 mg | RESPIRATORY_TRACT | Status: DC | PRN

## 2012-08-26 MED ORDER — ALPRAZOLAM 0.5 MG PO TABS
0.5000 mg | ORAL_TABLET | Freq: Three times a day (TID) | ORAL | Status: DC
  Administered 2012-08-26 – 2012-08-30 (×11): 0.5 mg via ORAL
  Filled 2012-08-26 (×12): qty 1

## 2012-08-26 MED ORDER — ALPRAZOLAM ER 0.5 MG PO TB24: 0.5000 mg | ORAL_TABLET | Freq: Three times a day (TID) | ORAL | Status: DC

## 2012-08-26 NOTE — Progress Notes (Signed)
Patient transferred to 3305 on telemetry monitor with sitter present. RN who received report present on arrival. Patient placed on monitor in room, no needs at this time.   Wyn Quaker RN

## 2012-08-26 NOTE — Progress Notes (Addendum)
PULMONARY  / CRITICAL CARE MEDICINE  Name: THERSA MOHIUDDIN MRN: [id number] DOB: 03/21/1956    LOS: 6  REFERRING MD:  [facility]  CHIEF COMPLAINT:  Unresponsive  BRIEF PATIENT DESCRIPTION:  56 yo found unresponsive next to empty Oxycodone bottle.  Seizure en route.  Intubated for airway protection.  Drug screen positive for Cocaine.  STEMI, seen by Cardiology, not a candidate for emergent cardiac catheterization.  LINES / TUBES: 12/10  OETT >>> 12/14 12/10  OGT >>> 12/14 12/10  Foley >>> 12/14  NGT >>>  CULTURES: 12/10  Blood >>> 12/10  Urine >>> neg  ANTIBIOTICS: Levaquin 12/10 x 1 Vancomycin 12/10 x 1 Unasyn 12/10 >>> 12/14  SIGNIFICANT EVENTS:  12/10  Admitted with cocaine induced seizure / STEMI.  Intubated for airway protection. 12/13  Right-sided weakness - head CT negative 12/14  Self extubated 12/16- no distress, agitation  LEVEL OF CARE:  ICU  PRIMARY SERVICE:  PCCM  CONSULTANTS:  Cardiology  CODE STATUS:  Full  DIET:  NPO  DVT Px:  Heparin [place]   GI Px:  Not indicated  INTERVAL HISTORY:  Intermittently agitated   VITAL SIGNS: Temp:  [98.4 F (36.9 C)] 98.4 F (36.9 C) (12/15 1500) Pulse Rate:  [89-135] 106  (12/16 1300) Resp:  [13-25] 22  (12/16 1300) BP: (109-159)/(66-111) 132/88 mmHg (12/16 1300) SpO2:  [93 %-100 %] 100 % (12/16 1300) Weight:  [60.3 kg (132 lb 15 oz)] 60.3 kg (132 lb 15 oz) (12/16 0300)  HEMODYNAMICS:   VENTILATOR SETTINGS:   INTAKE / OUTPUT: Intake/Output      12/15 0701 - 12/16 0700 12/16 0701 - 12/17 0700   I.V. (mL/kg) 1816.3 (30.1) 431.3 (7.2)   NG/GT  120   IV Piggyback 520 110   Total Intake(mL/kg) 2336.3 (38.7) 661.3 (11)   Urine (mL/kg/hr) 3235 (2.2) 570 (1.3)   Total Output 3235 570   Net -898.8 +91.3        Stool Occurrence 2 x     PHYSICAL EXAMINATION: General:  Appears chronically ill, no distress Neuro:  Encephalopathic, no focal, no focal weakness, follows some commands, int  agitation HEENT:  PERRL 2 mm Cardiovascular:  RRR, no murmurs Lungs:  Bilateral diminished Abdomen:  Soft, nontender, bowel sounds diminished Musculoskeletal:  Moves all extremities, no edema Skin:  Intact  LABS:  Lab 08/26/12 0444 08/25/12 0445 08/24/12 0445 08/23/12 0330 08/22/12 1810 08/22/12 0350 08/22/12 0333 08/21/12 0904 08/21/12 0410 08/21/12 0230 08/20/12 1929 08/20/12 1926 08/20/12 1418 08/20/12 1359 08/20/12 1356  HGB 15.5* 14.5 13.0 12.1 -- -- 13.4 -- -- -- -- -- -- -- --  WBC 6.1 6.8 5.8 5.0 -- -- 8.9 -- -- -- -- -- -- -- --  PLT 147* 148* 120* -- -- -- -- -- -- -- -- -- -- -- --  NA 135 141 151* 148* 149* -- -- -- -- -- -- -- -- -- --  K 2.9* 3.0* -- -- -- -- -- -- -- -- -- -- -- -- --  CL 99 105 119* 117* 118* -- -- -- -- -- -- -- -- -- --  CO2 25 26 21 22 22  -- -- -- -- -- -- -- -- -- --  GLUCOSE 96 104* 127* 113* 109* -- -- -- -- -- -- -- -- -- --  BUN <3* 6 22 23 22  -- -- -- -- -- -- -- -- -- --  CREATININE 0.49* 0.50 0.56 0.60 0.61 -- -- -- -- -- -- -- -- -- --  CALCIUM 9.1 9.0 9.0 8.9 8.7 -- -- -- -- -- -- -- -- -- --  MG -- -- 1.6 1.9 -- -- 2.1 -- -- -- -- 1.2* -- -- --  PHOS -- -- 2.4 2.5 -- -- 2.1* -- -- -- -- 2.8 -- -- --  AST -- -- 66* -- -- -- -- -- -- -- -- -- -- 215* --  ALT -- -- 112* -- -- -- -- -- -- -- -- -- -- 78* --  ALKPHOS -- -- 78 -- -- -- -- -- -- -- -- -- -- 73 --  BILITOT -- -- 0.2* -- -- -- -- -- -- -- -- -- -- 0.4 --  PROT -- -- 5.1* -- -- -- -- -- -- -- -- -- -- 7.0 --  ALBUMIN -- -- 2.5* -- -- -- -- -- -- -- -- -- -- 4.0 --  INR -- -- -- -- -- -- -- -- -- -- -- -- -- 1.13 --  APTT -- -- -- -- -- -- -- -- -- -- -- -- -- 30 --  INR -- -- -- -- -- -- -- -- -- -- -- -- -- 1.13 --  LATICACIDVEN -- -- -- -- -- -- -- -- -- -- -- -- 1.6 -- --  TROPONINI -- -- -- -- -- -- -- 3.86* -- 7.21* -- 7.47* -- 13.95* --  PHART -- -- -- -- -- 7.386 -- -- 7.353 -- 7.323* -- -- -- 7.342*  PCO2ART -- -- -- -- -- 39.2 -- -- 36.1 -- 44.0 -- -- -- 43.9  PO2ART  -- -- -- -- -- 89.5 -- -- 80.7 -- 144.0* -- -- -- 62.3*  HCO3 -- -- -- -- -- 23.0 -- -- 19.3* -- 21.7 -- -- -- 23.2  O2SAT -- -- -- -- -- 97.1 -- -- 95.1 -- 98.1 -- -- -- 88.5    Lab 08/24/12 0707 08/24/12 0334 08/23/12 2345 08/23/12 1934 08/23/12 1537  GLUCAP 132* 137* 125* 133* 145*   IMAGING: 12/10  CXR >>> Hardware in good position, RUL airspace disease 12/10  Head CT >>> nad 12/13  Head CT >>> nad 12/13  CXR >>> Stable support apparatus, bilateral airspace disease  ECG: 12/10  >>> Sinus tachycardia, ST elevations in anterolateral and inferior leads  DIAGNOSES: Active Problems:  Acute respiratory failure  Hypokalemia  COPD (chronic obstructive pulmonary disease)  Acute encephalopathy  Status epilepticus  Acute renal failure  Aspiration pneumonia  STEMI (ST elevation myocardial infarction)  Thrombocytopenia  Hypernatremia  Alcohol abuse  ASSESSMENT / PLAN:  PULMONARY  A:  Acute respiratory failure due to inability to protect airway - resoled - self extubated 12/14, doing well on 2L [place].  No stridor.  Aspiration pneumonitis, less likely pneumonia. COPD without exacerbation. P:   Gaol SpO2>92 Supplemental oxygen Albuterol / Atrovent PRN, will add scheduled Holding Symbicort / Spiriva due to encephalopathy Last pcxr reviewed  CARDIOVASCULAR  A: STEMI in setting of Cocaine use.  Hypotension - resolved.  Intermittent tachycardia - likely related to agitation. P:  Seen by Cardiology - no plans for cath ASA Beta-blocker contraindicated - active Cocaine use,could slowly consider labetolol with alpha additional Statin contraindicated - elevated transaminases, follow in am   RENAL  A:  Acute renal failure - resolved. Hypernatremia - resolved.  Hypokalemia. P:   bmet in am  supp k  kvo when start TF, Im ok with bolus feeds  GASTROINTESTINAL  A:  Elevated transaminases - alcoholic pattern -  improving.  Hep C. Possible melena reported, stable Hgb, FOBT neg. P:    NPO NGT Swallow evaluation today, if failed would start TF, they were mild  HEMATOLOGIC  A:  Thrombocytopenia, alcohol related? P:  Trend CBC in 2 days Sub  q heparin  INFECTIOUS  A:  Aspiration pneumonitis, less likely pneumonia as low PCT and normalized WBC. P:   Monitor fever curve  ENDOCRINE   A:  No active issues.  Normoglycemic. P:   Monitor  NEUROLOGIC  A:  Seizure disorder.  Status epilepticus in setting of Cocaine use >> no further seizures noted. Suspected alcohol abuse.  Acute encephalopathy - slowly improving.  Anxiety / depression / PTSD / OCD.  R sided weakness improved, nad on repeat CT. Likely benzo WD P:   Tegretol + Keppra  Thiamine / Folate change to PO Reintroduce preadmission Elavil Hold Xanax, Neurontin, Cymbalta Add xanax back  CLINICAL SUMMARY: 56 yo found unresponsive next to empty Oxychodone bottle.  Seizure en route.  Intubated for airway protection.  Drug screen positive for cocaine.  STEMI, seen by Cardiology, not a candidate for emergent cardiac catheterization.  Heparin completed.  ASA continued.  Beta blockers / statins contraindicated.  Antibiotics d/c'd as more likely pneumonitis than pneumonia.  Self extubated 12/14, doing well.treat agitation, benzo WD? Will call psych, suicide precautions, sitter To sdu, traid if bed  Nelda Bucks., MD  Pulmonary and Critical Care Medicine [facility] Pager: [phone]  08/26/2012, 2:19 PM

## 2012-08-26 NOTE — Progress Notes (Signed)
[facility] ADULT ICU REPLACEMENT PROTOCOL FOR AM LAB REPLACEMENT ONLY  The patient does apply for the [facility] Adult ICU Electrolyte Replacment Protocol based on the criteria listed below:   1. Is GFR >/= 50 ml/min? yes  Patient's GFR today is >90 2. Is urine output >/= 0.5 ml/kg/hr for the last 8 hours? yes Patient's UOP is 1.4 ml/kg/hr 3. Is BUN < 30 mg/dL? yes  Patient's BUN today is <3 4. Abnormal electrolyte(s): K+2.9 5. Ordered repletion with: protocol 6. If a panic level lab has been reported, has the CCM MD in charge been notified? yes.   Physician:  Dr Donney Rankins, Einar Gip 08/26/2012 5:43 AM

## 2012-08-26 NOTE — Progress Notes (Signed)
NUTRITION FOLLOW UP  Intervention:   Start TF via NG tube with Jevity 1.2 at 20 ml/h, increase by 10 ml every 4 hours to goal rate of 60 ml/h to provide 1728 kcals, 80 gm protein, 1166 ml free water daily.  Nutrition Dx:   Inadequate oral intake related to inability to eat as evidenced by NPO status, ongoing.  Goal:  Intake to meet >90% of estimated nutrition needs, unmet.   Monitor:  TF tolerance/adequacy, weight trend, labs, I/O, swallowing function.  Assessment:   Noted plans for swallow evaluation soon to determine safest diet consistency for patient.  Received MD Consult for TF initiation and management.  NG tube is in place, looped in the stomach per radiology reports.    Height: Ht Readings from Last 1 Encounters:  08/21/12 5' 0.24" (1.53 m)    Weight Status:   Wt Readings from Last 1 Encounters:  08/26/12 132 lb 15 oz (60.3 kg)    Re-estimated needs:  Kcal: 1550-1750 Protein: 75-90 gm Fluid: 1.5-1.7 L   Skin: no issues noted  Diet Order: NPO   Intake/Output Summary (Last 24 hours) at 08/26/12 1507 Last data filed at 08/26/12 1400  Gross per 24 hour  Intake 1971.25 ml  Output   2205 ml  Net -233.75 ml    Last BM: 12/15   Labs:   Lab 08/26/12 0444 08/25/12 0445 08/24/12 0445 08/23/12 0330 08/22/12 0333  NA 135 141 151* -- --  K 2.9* 3.0* 3.2* -- --  CL 99 105 119* -- --  CO2 25 26 21  -- --  BUN <3* 6 22 -- --  CREATININE 0.49* 0.50 0.56 -- --  CALCIUM 9.1 9.0 9.0 -- --  MG -- -- 1.6 1.9 2.1  PHOS -- -- 2.4 2.5 2.1*  GLUCOSE 96 104* 127* -- --   CBG (last 3)   Basename 08/24/12 0707 08/24/12 0334 08/23/12 2345  GLUCAP 132* 137* 125*    Scheduled Meds:   . ipratropium  0.5 mg Nebulization Q4H   And  . albuterol  2.5 mg Nebulization Q4H  . ALPRAZolam  0.5 mg Oral TID  . amitriptyline  25 mg Oral QHS  . antiseptic oral rinse  15 mL Mouth Rinse QID  . aspirin  325 mg Oral Daily  . carBAMazepine  400 mg Per Tube TID  . chlorhexidine  15  mL Mouth Rinse BID  . DULoxetine  30 mg Oral BID  . folic acid  1 mg Oral Daily  . gabapentin  100 mg Oral TID  . heparin subcutaneous  5,000 Units Subcutaneous Q8H  . levETIRAcetam  1,000 mg Oral BID  . thiamine  100 mg Oral Daily   Continuous Infusions:   . dextrose 75 mL/hr at 08/26/12 1130  . feeding supplement (OSMOLITE 1.2 CAL)      Joaquin Courts, RD, LDN, CNSC Pager# [phone] After Hours Pager# [phone]

## 2012-08-26 NOTE — Progress Notes (Signed)
Clinical Social Work Department CLINICAL SOCIAL WORK PSYCHIATRY SERVICE LINE ASSESSMENT 08/26/2012  Patient:  Donna [name]  Account:  [id number]  Admit Date:  08/20/2012  Clinical Social Worker:  Unk Lightning, LCSW  Date/Time:  08/26/2012 02:30 PM Referred by:  Physician  Date referred:  08/26/2012 Reason for Referral  Behavioral Health Issues   Presenting Symptoms/Problems (In the person's/family's own words):   Possible suicide attempt   Abuse/Neglect/Trauma History (check all that apply)  Denies history   Abuse/Neglect/Trauma Comments:   Per family   Psychiatric History (check all that apply)  Inpatient/hospitilization   Psychiatric medications:  Xanax, Cymbalta   Current Mental Health Hospitalizations/Previous Mental Health History:   Patient was admitted to [facility] for about 3 weeks this year after suicide attempt. Patient has gone for inpatient substance abuse treatment about 7-8 years ago.   Current provider:   None   Place and Date:   N/A   Current Medications:   sodium chloride, acetaminophen (TYLENOL) oral liquid 160 mg/5 mL, midazolam                        . ipratropium  0.5 mg Nebulization Q4H   And     . albuterol  2.5 mg Nebulization Q4H  . ALPRAZolam  0.5 mg Oral TID  . amitriptyline  25 mg Oral QHS  . antiseptic oral rinse  15 mL Mouth Rinse QID  . aspirin  325 mg Oral Daily  . carBAMazepine  400 mg Per Tube TID  . chlorhexidine  15 mL Mouth Rinse BID  . DULoxetine  30 mg Oral BID  . folic acid  1 mg Oral Daily  . gabapentin  100 mg Oral TID  . heparin subcutaneous  5,000 Units Subcutaneous Q8H  . levETIRAcetam  1,000 mg Oral BID  . thiamine  100 mg Oral Daily   Previous Impatient Admission/Date/Reason:   [facility]-2013 for 3 weeks   Emotional Health / Current Symptoms    Suicide/Self Harm  Suicide attempt in past (date/description)   Suicide attempt in the past:   Patient attempted to OD on Xanax this year. She told son  about taking the pills and son took her to the ED. At dc she was IVC to [facility].   Other harmful behavior:   Psychotic/Dissociative Symptoms  Unable to accurately assess   Other Psychotic/Dissociative Symptoms:    Attention/Behavioral Symptoms  Unable to accurately assess   Other Attention / Behavioral Symptoms:    Cognitive Impairment  Unable to accurately assess   Other Cognitive Impairment:    Mood and Adjustment  Unable to accurately assess    Stress, Anxiety, Trauma, Any Recent Loss/Stressor  Other - See comment   Anxiety (frequency):   Phobia (specify):   Compulsive behavior (specify):   Obsessive behavior (specify):   Other:   Son reports that patient struggles with managing money effectively. Patient has "bad influences"   Substance Abuse/Use  Current substance use   SBIRT completed (please refer for detailed history):  NA  Self-reported substance use:   Son reports that patient has been using prescription drugs for about 25 years. Patient used cocaine in the past but son reports she recently started using cocaine again.   Urinary Drug Screen Completed:  Y Alcohol level:   <10    Positive for cocaine and benzodiaepines    Environmental/Housing/Living Arrangement  Stable housing   Who is in the home:   Alone   Emergency  contact:  [phone]   Financial  Medicare   Patient's Strengths and Goals (patient's own words):   Patient has supportive son and sister who assist with care and have been encouraging her to receive treatment for several years.   Clinical Social Worker's Interpretive Summary:   CSW received referral due to RN believing that patient might have attempted suicide. CSW reviewed chart and met with patient and family at bedside. Patient unable to participate in assessment at this time. CSW gathered information from family and will follow up with patient when she is alert.    CSW introduced myself and explained role. Son Donna [name])  and sister Donna [name]) were present. Sister reports that she found patient at home and called 911. Son reports that patient is in the hospital about once a month. Son described patient's lifestyle and reports that patient has been using substances and dealing with depression for several years. Patient receives disability check and son reports that patient uses all her money in the first two weeks to buy prescription drugs and street drugs. Patient then becomes depressed and is unable to buy necessary items such as medication and food. Son does not believe this was a suicide attempt but sister believes so. Sister reports that patient has had a decrease in hygiene and eating less. Sister has to motivate patient to clean her house or even take a bath. Sister reports this is uncharacteristic of patient and she believes that patient is depressed.    Earlier this year, son went to visit patient and she admitted that she had taken 200 Xanax. Patient did not want to go to the hospital but son insisted. At the hospital patient admitted to [facility] and was sent to [facility] for 3 weeks. Son does not believe that patient will admit to SI even if she is having them because she hated inpatient treatment. Son feels conflicted but believes that this was just part of patient's routine.    Patient has received treatment for substance abuse and son reports she was doing well about 7-8 years ago. Son reports that patient has declined and he has tried to get her help. Patient has called APS several times but they refuse to take her case due to patient having capacity. Son states patient is admitted to hospital, cleared by psych and then returns home to same pattern. Son reports feeling hopeless about the situation changing or that patient will ever agree to treatment.    CSW explained that if patient has capacity then we cannot force her into treatment. CSW encouraged son and sister to encourage treatment and offered to assist with  options if agreeable. CSW will follow up when patient is alert to complete assessment with her and to provide treatment options.    Son inquired about payee information for patient's disability check. CSW provided son with information for [employer] and asked him to defer questions to their facility.   Disposition:  Recommend Psych CSW continuing to support while in hospital

## 2012-08-26 NOTE — Progress Notes (Signed)
Clinical Social Work  CSW received referral to assist with substance abuse and to complete psychosocial assessment. Patient unable to participate in assessment. CSW called numbers listed for son and sister in chart and both numbers are disconnected. CSW called sister Donna [name]) and left a message with CSW contact information. CSW will continue to follow.  [place], [place] [phone]

## 2012-08-27 ENCOUNTER — Encounter (HOSPITAL_COMMUNITY): Payer: Self-pay | Admitting: Cardiology

## 2012-08-27 ENCOUNTER — Inpatient Hospital Stay (HOSPITAL_COMMUNITY): Payer: Medicare Other

## 2012-08-27 DIAGNOSIS — I5032 Chronic diastolic (congestive) heart failure: Secondary | ICD-10-CM | POA: Diagnosis present

## 2012-08-27 DIAGNOSIS — K7030 Alcoholic cirrhosis of liver without ascites: Secondary | ICD-10-CM | POA: Diagnosis present

## 2012-08-27 DIAGNOSIS — R1310 Dysphagia, unspecified: Secondary | ICD-10-CM | POA: Diagnosis present

## 2012-08-27 DIAGNOSIS — E512 Wernicke's encephalopathy: Secondary | ICD-10-CM | POA: Diagnosis present

## 2012-08-27 LAB — IRON AND TIBC
Saturation Ratios: 72 % — ABNORMAL HIGH (ref 20–55)
TIBC: 220 ug/dL — ABNORMAL LOW (ref 250–470)

## 2012-08-27 LAB — CBC
HCT: 43 % (ref 36.0–46.0)
Hemoglobin: 14.4 g/dL (ref 12.0–15.0)
MCV: 90.9 fL (ref 78.0–100.0)
RBC: 4.73 MIL/uL (ref 3.87–5.11)
WBC: 5.2 10*3/uL (ref 4.0–10.5)

## 2012-08-27 LAB — BASIC METABOLIC PANEL
BUN: <3 mg/dL — ABNORMAL LOW (ref 6–23)
CO2: 27 mEq/L (ref 19–32)
Chloride: 104 mEq/L (ref 96–112)
Creatinine, Ser: 0.52 mg/dL (ref 0.50–1.10)
Glucose, Bld: 113 mg/dL — ABNORMAL HIGH (ref 70–99)

## 2012-08-27 LAB — GLUCOSE, CAPILLARY: Glucose-Capillary: 93 mg/dL (ref 70–99)

## 2012-08-27 LAB — VITAMIN B12: Vitamin B-12: 971 pg/mL — ABNORMAL HIGH (ref 211–911)

## 2012-08-27 LAB — RETICULOCYTES: RBC.: 5.08 MIL/uL (ref 3.87–5.11)

## 2012-08-27 MED ORDER — THIAMINE HCL 100 MG/ML IJ SOLN
100.0000 mg | Freq: Three times a day (TID) | INTRAMUSCULAR | Status: DC
  Filled 2012-08-27 (×2): qty 1

## 2012-08-27 MED ORDER — VITAMIN B-1 100 MG PO TABS
100.0000 mg | ORAL_TABLET | Freq: Three times a day (TID) | ORAL | Status: DC
  Administered 2012-08-27 – 2012-09-10 (×40): 100 mg
  Filled 2012-08-27 (×44): qty 1

## 2012-08-27 MED ORDER — LISINOPRIL 10 MG PO TABS
10.0000 mg | ORAL_TABLET | Freq: Every day | ORAL | Status: DC
  Administered 2012-08-27 – 2012-09-10 (×15): 10 mg via ORAL
  Filled 2012-08-27 (×15): qty 1

## 2012-08-27 MED ORDER — CELECOXIB 100 MG PO CAPS
100.0000 mg | ORAL_CAPSULE | Freq: Two times a day (BID) | ORAL | Status: DC
  Administered 2012-08-27 – 2012-09-10 (×26): 100 mg via ORAL
  Filled 2012-08-27 (×30): qty 1

## 2012-08-27 MED ORDER — STARCH (THICKENING) PO POWD
ORAL | Status: DC | PRN
  Filled 2012-08-27: qty 227

## 2012-08-27 MED ORDER — SODIUM CHLORIDE 0.9 % IV SOLN
INTRAVENOUS | Status: DC
  Administered 2012-08-27 – 2012-08-28 (×3): via INTRAVENOUS
  Administered 2012-08-28: 125 mL/h via INTRAVENOUS
  Administered 2012-08-28: 04:00:00 via INTRAVENOUS
  Administered 2012-08-29: 75 mL/h via INTRAVENOUS

## 2012-08-27 MED ORDER — CARBAMAZEPINE 100 MG/5ML PO SUSP
400.0000 mg | Freq: Three times a day (TID) | ORAL | Status: DC
  Administered 2012-08-27 – 2012-09-10 (×39): 400 mg
  Filled 2012-08-27 (×47): qty 20

## 2012-08-27 MED ORDER — HALOPERIDOL LACTATE 5 MG/ML IJ SOLN: 5.0000 mg | Freq: Four times a day (QID) | INTRAMUSCULAR | Status: DC | PRN

## 2012-08-27 MED ORDER — FREE WATER: 200.0000 mL | Freq: Four times a day (QID) | Status: DC

## 2012-08-27 NOTE — Progress Notes (Signed)
[facility] Progress Note [place] TEAM 1 - Stepdown/ICU TEAM   GENECIS VELEY ZOX:[id number] DOB: 06-15-1956 DOA: 08/20/2012 PCP: Louie Boston, MD  Brief narrative: 56 year old female patient who presented to the emergency department at [facility] by EMS. She had been found unresponsive by her neighbors. At time of presentation there was concern that the patient may have aspirated her usual medications. O2 sat was 76% in route to the hospital despite EMS placing patient on CPAP. Vision has a history of polysubstance abuse including cocaine and noncompliance. Upon arrival to the emergency department the patient was quite hypoxic and began having a seizure and was immediately intubated by the emergency room physician. Subsequent EKG revealed anterior ST segment elevation. Cardiologist on call reviewed the EKG. Because of patient's history of prior normal cardiac catheterization in 2011 and other explanatory factors for patient's abnormal EKG and clubbing altered mental status, severe hypoxia and seizure and fevers it was determined code STEMI would not be activated. Because of need for ICU admission and patient subsequently developed recurrent seizures consistent with status epilepticus pulmonary critical care medicine except that the patient in transfer [facility]. In the interim she was started on a Versed drip. Because of the EKG changes she was also started on heparin. She was also given Ativan to help suppress her seizures and was loaded with Keppra. It was later determined that the patient had been found unresponsive. Additional history was obtained after admission confirming that patient had been found unresponsive with an empty oxycodone bottle next to her. Her urine drug screen was positive for cocaine after arrival. There were concerns that patient may have aspiration pneumonitis so she was briefly given antibiotics. CT of the head was negative despite no dictation of right-sided  weakness which apparently is chronic. Patient eventually self extubated on 08/24/2012. She was subsequently transferred out of the ICU on 08/26/2012 and team 1 assumed care of this patient on 08/27/2012.    Assessment/Plan: Principal Problem:  *STEMI (ST elevation myocardial infarction) due to acute cocaine use *Felt not a candidate for acute cardiac catheterization as described above-previous catheterization June 2011 revealed no coronary artery disease *Troponin peaked at 41 and has trended downward- initial EKG had ST elevation in anterior leads *No apparent complaints of chest pain since admission and likely MI precipitated by concomitant use of cocaine with associated overdose and hypoxemia *For completeness we'll obtain a followup EKG (revealed TW inversion concerning for possible anterior ischemia)  and ask cardiology to evaluate formally to comment that current plan and care as appropriate *Because of cocaine use cannot use nonselective beta blocker although carvedilol could be utilized *Echocardiogram this admission shows no new regional wall motion abnormalities and demonstrates persistent apical akinesis  Active Problems:  Acute respiratory failure with hypoxia/ Aspiration pneumonitis *Multifactorial and primary etiology at time of admission felt to be 2/2 narcotic overdose with resultant hypoxemia and altered mentation requiring intubation *Likely has a degree of aspiration pneumonitis since no focal infiltrate seen on x-ray and patient has completed a course of Unasyn *Continue supportive care and encourage mobilization   Acute encephalopathy/Wernicke encephalopathy syndrome *Acute encephalopathy likely from the above-stated metabolic issues *Previous admission patient had MRI findings consistent with Wernicke's encephalopathy and apparently was appropriately treated with return of mentation to appropriate baseline to function independently after discharge *Currently patient's  mentation is very inappropriate --she remains confused despite appropriate time period for washout of any offending medications and our concern is she may have an irreversible encephalopathy/dementia  process *We'll go ahead and check thiamine levels and an anemia panel and replete as indicated   Status epilepticus *No further seizures since first 48 hours after admission and possibly seizures could have been precipitated by withdrawal from other substances such as alcohol or benzodiazepine   HEPATITIS C/Cirrhosis with alcoholism *Cirrhotic changes seen on previous imaging but total bilirubin normal   Acute renal failure *At admission creatinine peaked at 2.06 with a BUN of 33 and was likely multifactorial secondary to possible prerenal azotemia as well as hypoperfusion from hypotension *Currently has resolved with creatinine returning to baseline-noted patient has very low BUN less than 3   Thrombocytopenia *Has been acute in nature and likely related to consumption do to recent acute illness and presently has resolved with current platelet count 169,000 with a nadir this admission 113,000   Dysphagia *Likely related to recent intubation but given concerns over involving chronic encephalopathy formal swallowing evaluation has been completed and recommendation is for modified barium swallowing study   Hypokalemia *Resolved   COPD (chronic obstructive pulmonary disease) *Stable without wheezing   Hypernatremia *Resolved   Chronic diastolic congestive heart failure, NYHA class 1 *Compensated-previous echocardiogram reveals apical akinesis concerning for possible hypertrophic cardiomyopathy   DVT prophylaxis: TED hose and SCDs Code Status: Full Family Communication: Spoke only with patient Disposition Plan: Transfer to telemetry. PT and OT recommending skilled nursing placement. May require psych evaluation for capacity if patient refuses admission to skilled nursing  facility  Consultants: Pulmonary critical care medicine-signed off Cardiology consulted 08/27/2012  Procedures: None  CULTURES:  12/10 Blood >>>  12/10 Urine >>> neg  Antibiotics: Levaquin 12/10 x 1  Vancomycin 12/10 x 1  Unasyn 12/10 >>> 12/14   HPI/Subjective: Patient is awake but quite confused. She is able to follow commands. No specific complaints verbalized.   Objective: Blood pressure 141/89, pulse 106, temperature 97.4 F (36.3 C), temperature source Oral, resp. rate 23, height 5' 0.24" (1.53 m), weight 58.968 kg (130 lb), SpO2 97.00%.  Intake/Output Summary (Last 24 hours) at 08/27/12 1339 Last data filed at 08/27/12 1300  Gross per 24 hour  Intake   1430 ml  Output    530 ml  Net    900 ml     Exam: General: No acute respiratory distress Lungs: Clear to auscultation bilaterally without wheezes or crackles, nasal cannula oxygen Cardiovascular: Regular rate and rhythm without murmur gallop or rub normal S1 and S2, no JVD or peripheral Abdomen: Nontender, nondistended, soft, bowel sounds positive, no rebound, no ascites, no appreciable mass Musculoskeletal: No significant cyanosis, clubbing of bilateral lower extremities Neurological: Patient is alert and awake but clearly not oriented except to name. She is able to follow simple commands when prompted. When asked to stick out tongue did not seem to understand command but when demonstrated command she was able to perform. She was able to use the right arm to scratch her head seemed to have difficulty with fine motor usage. Otherwise strength appears to be intact and at least 4/5 while seated in the bed. Please refer to physical therapy notes regarding patient's documented ataxia.  Data Reviewed: Basic Metabolic Panel:  Lab 08/27/12 6578 08/26/12 2027 08/26/12 0444 08/25/12 0445 08/24/12 0445 08/23/12 0330 08/22/12 0333 08/20/12 1926  NA 141 135 135 141 151* -- -- --  K 3.5 3.2* 2.9* 3.0* 3.2* -- -- --  CL 104  100 99 105 119* -- -- --  CO2 27 25 25 26 21  -- -- --  GLUCOSE 113* 100* 96 104* 127* -- -- --  BUN <3* <3* <3* 6 22 -- -- --  CREATININE 0.52 0.50 0.49* 0.50 0.56 -- -- --  CALCIUM 8.9 8.7 9.1 9.0 9.0 -- -- --  MG -- -- -- -- 1.6 1.9 2.1 1.2*  PHOS -- -- -- -- 2.4 2.5 2.1* 2.8   Liver Function Tests:  Lab 08/24/12 0445 08/20/12 1359  AST 66* 215*  ALT 112* 78*  ALKPHOS 78 73  BILITOT 0.2* 0.4  PROT 5.1* 7.0  ALBUMIN 2.5* 4.0    Lab 08/20/12 1359  LIPASE 8*  AMYLASE --    Lab 08/20/12 1422  AMMONIA 56   CBC:  Lab 08/27/12 0425 08/26/12 0444 08/25/12 0445 08/24/12 0445 08/23/12 0330 08/20/12 1926 08/20/12 1359  WBC 5.2 6.1 6.8 5.8 5.0 -- --  NEUTROABS -- -- -- -- -- 13.1* 15.4*  HGB 14.4 15.5* 14.5 13.0 12.1 -- --  HCT 43.0 45.0 43.7 40.0 37.5 -- --  MCV 90.9 90.0 93.6 94.3 94.2 -- --  PLT 169 147* 148* 120* 115* -- --   Cardiac Enzymes:  Lab 08/21/12 0904 08/21/12 0230 08/20/12 1926 08/20/12 1359  CKTOTAL -- -- -- 3339*  CKMB -- -- -- 60.5*  CKMBINDEX -- -- -- --  TROPONINI 3.86* 7.21* 7.47* 13.95*   BNP (last 3 results)  Basename 04/23/12 0327 04/18/12 1924  PROBNP 19352.0* 16764.0*   CBG:  Lab 08/27/12 1133 08/27/12 0743 08/27/12 0320 08/26/12 2329 08/26/12 1945  GLUCAP 100* 110* 93 97 82    Recent Results (from the past 240 hour(s))  CULTURE, BLOOD (ROUTINE X 2)     Status: Normal   Collection Time   08/20/12  1:59 PM      Component Value Range Status Comment   Specimen Description BLOOD RIGHT HAND   Final    Special Requests BOTTLES DRAWN AEROBIC AND ANAEROBIC 4CC   Final    Culture NO GROWTH 5 DAYS   Final    Report Status 08/25/2012 FINAL   Final   URINE CULTURE     Status: Normal   Collection Time   08/20/12  2:03 PM      Component Value Range Status Comment   Specimen Description URINE, CLEAN CATCH   Final    Special Requests NONE   Final    Culture  Setup Time 08/20/2012 16:50   Final    Colony Count 9,000 COLONIES/ML   Final     Culture INSIGNIFICANT GROWTH   Final    Report Status 08/21/2012 FINAL   Final   CULTURE, BLOOD (ROUTINE X 2)     Status: Normal   Collection Time   08/20/12  2:23 PM      Component Value Range Status Comment   Specimen Description BLOOD LEFT ANTECUBITAL   Final    Special Requests BOTTLES DRAWN AEROBIC ONLY 4CC   Final    Culture NO GROWTH 5 DAYS   Final    Report Status 08/25/2012 FINAL   Final   MRSA PCR SCREENING     Status: Normal   Collection Time   08/20/12  7:22 PM      Component Value Range Status Comment   MRSA by PCR NEGATIVE  NEGATIVE Final      Studies:  Recent x-ray studies have been reviewed in detail by the Attending Physician  Scheduled Meds:  Reviewed in detail by the Attending Physician   Junious Silk, ANP [facility] Office  [phone] Pager  [phone]  On-Call/Text Page:      [email or web address]      password [email or web address]  If 7PM-7AM, please contact night-coverage [email or web address] Password [email or web address] 08/27/2012, 1:39 PM   LOS: 7 days   I have examined the patient and reviewed the chart. I agree with the above note which I have modified.   Calvert Cantor, MD [phone]

## 2012-08-27 NOTE — Progress Notes (Signed)
Patient transferred to 48.  Report called to RN on [phone] and all questions answered.  Patient transferred via wheelchair with NT. Family notified of new room assignment.

## 2012-08-27 NOTE — Procedures (Signed)
Objective Swallowing Evaluation: Modified Barium Swallowing Study  Patient Details  Name: Donna [name] MRN: [id number] Date of Birth: 1956/03/27  Today's Date: 08/27/2012 Time: 1303-1330 SLP Time Calculation (min): 27 min  Past Medical History:  Past Medical History  Diagnosis Date  . Coronary artery disease   . Degenerative disc disease   . Asthma   . Lupus   . Hepatitis C   . Anxiety   . Obsessive-compulsive disorder   . Depression   . PTSD (post-traumatic stress disorder)   . Seizures   . Polysubstance abuse    Past Surgical History:  Past Surgical History  Procedure Date  . Cholecystectomy   . Appendectomy   . Tonsillectomy   . Tubal ligation   . Polyps on vocal cords   . Cardiac catheterization   . Esophagogastroduodenoscopy 06/02/2012    Procedure: ESOPHAGOGASTRODUODENOSCOPY (EGD);  Surgeon: Malissa Hippo, MD;  [facility];  Service: Endoscopy;  Laterality: N/A;  . 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  Seizure en route.  Intubated for airway protection.  Drug screen positive for cocaine.  STEMI, seen by Cardiology, not a candidate for emergent cardiac catheterization.  Heparin completed.  ASA continued.  Beta blockers / statins contraindicated.  Antibiotics d/c'd as more likely pneumonitis than pneumonia.  Self extubated 12/14, doing well.treat agitation, benzo WD?  PT OT reports concern for right sided weakness prior to SLp swallow eval. MD agreed to cognitive linguistic eval.      Assessment / Plan / Recommendation Clinical Impression  Clinical impression: Pt presents with moderate sensory based oropharyngeal deficits leading to delayed initiation of swallow response and silent aspiration. Oral function is likely at baseline though pt struggled to consume solids due to cognitive impairment and poor dentition. Pharyngeal swallow characterized by delay in swallow initation to the  pyriform sinuses with silent aspiration of thin liquids and in one instance nectar thick liquids. The pts intake is impulsive. With small controlled sips the pt may tolerate thin liquids but at this time would be at high risk of aspiration due to cognitive deficits and inability to follow precautions/strategies. Recommend a dys 3 (mechanical soft) diet with nectar thick liquids.     Treatment Recommendation  Therapy as outlined in treatment plan below    Diet Recommendation Dysphagia 3 (Mechanical Soft);Nectar-thick liquid   Liquid Administration via: Cup;No straw Medication Administration: Whole meds with puree Supervision: Patient able to self feed;Full supervision/cueing for compensatory strategies Compensations: Slow rate;Small sips/bites;Clear throat intermittently Postural Changes and/or Swallow Maneuvers: Seated upright 90 degrees;Upright 30-60 min after meal    Other  Recommendations Oral Care Recommendations: Oral care BID Other Recommendations: Order thickener from pharmacy   Follow Up Recommendations  Inpatient Rehab    Frequency and Duration min 2x/week  2 weeks   Pertinent Vitals/Pain NA    SLP Swallow Goals Patient will consume recommended diet without observed clinical signs of aspiration with: Minimal assistance Patient will utilize recommended strategies during swallow to increase swallowing safety with: Minimal cueing   General HPI: 56 yo found unresponsive next to empty Oxychodone bottle.  Seizure en route.  Intubated for airway protection.  Drug screen positive for cocaine.  STEMI, seen by Cardiology, not a candidate for emergent cardiac catheterization.  Heparin completed.  ASA continued.  Beta blockers / statins contraindicated.  Antibiotics d/c'd as more likely pneumonitis than pneumonia.  Self extubated 12/14, doing well.treat agitation, benzo WD?  PT  OT reports concern for right sided weakness prior to SLp swallow eval. MD agreed to cognitive linguistic eval.   Type of Study: Modified Barium Swallowing Study Reason for Referral: Objectively evaluate swallowing function Diet Prior to this Study: NPO Respiratory Status: Supplemental O2 delivered via (comment) History of Recent Intubation: Yes Length of Intubations (days): 2 days Date extubated: 08/24/12 Behavior/Cognition: Alert;Cooperative;Confused;Pleasant mood Oral Cavity - Dentition: Missing dentition Oral Motor / Sensory Function: Within functional limits Self-Feeding Abilities: Able to feed self Patient Positioning: Upright in bed Baseline Vocal Quality: Clear Volitional Cough: Weak Volitional Swallow: Able to elicit Anatomy: Within functional limits Pharyngeal Secretions: Not observed secondary MBS    Reason for Referral Objectively evaluate swallowing function   Oral Phase Oral Preparation/Oral Phase Oral Phase: Impaired Oral - Nectar Oral - Nectar Cup: Within functional limits Oral - Thin Oral - Thin Cup: Within functional limits Oral - Thin Straw: Within functional limits Oral - Solids Oral - Puree: Within functional limits Oral - Mechanical Soft: Other (Comment) (attempted but pt sucked on cracker)   Pharyngeal Phase Pharyngeal Phase Pharyngeal Phase: Impaired Pharyngeal - Nectar Pharyngeal - Nectar Cup: Delayed swallow initiation;Pharyngeal residue - valleculae;Pharyngeal residue - pyriform sinuses;Trace aspiration;Penetration/Aspiration after swallow Penetration/Aspiration details (nectar cup): Material does not enter airway;Material enters airway, remains ABOVE vocal cords then ejected out;Material enters airway, passes BELOW cords without attempt by patient to eject out (silent aspiration) Pharyngeal - Thin Pharyngeal - Thin Cup: Delayed swallow initiation;Penetration/Aspiration before swallow;Pharyngeal residue - pyriform sinuses;Moderate aspiration Penetration/Aspiration details (thin cup): Material enters airway, passes BELOW cords without attempt by patient to eject  out (silent aspiration);Material does not enter airway Pharyngeal - Thin Straw: Delayed swallow initiation;Penetration/Aspiration before swallow;Pharyngeal residue - pyriform sinuses;Moderate aspiration Penetration/Aspiration details (thin straw): Material enters airway, passes BELOW cords without attempt by patient to eject out (silent aspiration);Material does not enter airway Pharyngeal - Solids Pharyngeal - Puree: Delayed swallow initiation  Cervical Esophageal Phase    GO    Cervical Esophageal Phase Cervical Esophageal Phase: Impaired Cervical Esophageal Phase - Comment Cervical Esophageal Comment: Appearance of osteophyte at C6/7 impacting opening of UES. Mild backflow to pyriforms from UES post swallow.         Harlon Ditty, [place] CCC-SLP [phone]  Claudine Mouton 08/27/2012, 3:36 PM

## 2012-08-27 NOTE — Evaluation (Signed)
Speech Language Pathology Evaluation Patient Details Name: Donna [name] MRN: [id number] DOB: [birth year] Today's Date: 08/27/2012 Time: 3474-2595 SLP Time Calculation (min): 41 min  Problem List:  Patient Active Problem List  Diagnosis  . HEPATITIS C  . TOBACCO USER  . DEPRESSION  . ALLERGIC RHINITIS, SEASONAL  . GASTROESOPHAGEAL REFLUX DISEASE  . INSOMNIA  . COPD exacerbation  . NSTEMI (non-ST elevated myocardial infarction)  . Pulmonary edema  . Acute respiratory failure with hypoxia  . Nausea & vomiting  . Chest pain  . Seizure  . Wernicke encephalopathy  . Hypokalemia  . COPD (chronic obstructive pulmonary disease)  . Hypertension  . Substance abuse  . Acute encephalopathy  . Status epilepticus  . Acute renal failure  . Aspiration pneumonitis  . STEMI (ST elevation myocardial infarction) do to acute cocaine use  . Thrombocytopenia  . Hypernatremia  . Alcohol abuse  . Wernicke encephalopathy syndrome  . Cirrhosis with alcoholism  . Dysphagia  . Chronic diastolic congestive heart failure, NYHA class 1   Past Medical History:  Past Medical History  Diagnosis Date  . Coronary artery disease   . Degenerative disc disease   . Asthma   . Lupus   . Hepatitis C   . Heart attack   . Anxiety   . Obsessive-compulsive disorder   . Depression   . PTSD (post-traumatic stress disorder)   . Seizures    Past Surgical History:  Past Surgical History  Procedure Date  . Cholecystectomy   . Appendectomy   . Tonsillectomy   . Tubal ligation   . Polyps on vocal cords   . Cardiac catheterization   . Esophagogastroduodenoscopy 06/02/2012    Procedure: ESOPHAGOGASTRODUODENOSCOPY (EGD);  Surgeon: Malissa Hippo, MD;  Location[facility];  Service: Endoscopy;  Laterality: N/A;  . 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  Seizure en route.  Intubated for airway protection.  Drug screen positive for  cocaine.  STEMI, seen by Cardiology, not a candidate for emergent cardiac catheterization.  Heparin completed.  ASA continued.  Beta blockers / statins contraindicated.  Antibiotics d/c'd as more likely pneumonitis than pneumonia.  Self extubated 12/14, doing well.treat agitation, benzo WD?  PT OT reports concern for right sided weakness prior to SLp swallow eval. MD agreed to cognitive linguistic eval.    Assessment / Plan / Recommendation Clinical Impression  Pt presents with moderate receptive and expressive aphasia, cognitive deficits impacting basic functional tasks, right inattention, decreased safety awareness and impulsivity. Perseveration in over 50% of verbal tasks. 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 SLp will continue to follow for improved participation in expression of wants and needs, basic functional tasks. Pt would benefit from inpatient rehab consult.     SLP Assessment  Patient needs continued Speech Lanaguage Pathology Services    Follow Up Recommendations  Inpatient Rehab    Frequency and Duration min 2x/week  2 weeks   Pertinent Vitals/Pain NA   SLP Goals  SLP Goals Potential to Achieve Goals: Good SLP Goal #1: Pt will express biasc wants needs during functional task as needed with min verbal cues.  SLP Goal #2: Pt will attend to objects in right visual field with min verbal cues during functional problem solving task.  SLP Goal #3: Pt will follow 1 step commands with 80 % accuracy with min visual cues.   SLP Evaluation Prior Functioning  Cognitive/Linguistic Baseline: Baseline deficits Baseline deficit  details: psychiatric history - OCD, PTSD, anxiety, depression, suicide attempt Vocation: On disability   Cognition  Overall Cognitive Status: Impaired Arousal/Alertness: Awake/alert Orientation Level: Oriented to person;Disoriented to place;Disoriented to time;Disoriented to situation Attention: Sustained;Focused;Selective Focused Attention:  Appears intact Sustained Attention: Appears intact Selective Attention: Impaired Selective Attention Impairment: Verbal basic;Functional basic Memory:  (NT due to verbal deficits) Awareness: Impaired Awareness Impairment: Intellectual impairment;Emergent impairment Problem Solving: Impaired Problem Solving Impairment: Verbal basic;Functional basic Executive Function:  (impaired by lower level deficits) Behaviors: Impulsive;Perseveration Safety/Judgment: Impaired    Comprehension  Auditory Comprehension Overall Auditory Comprehension: Impaired Yes/No Questions: Impaired Basic Biographical Questions: 26-50% accurate Commands: Impaired One Step Basic Commands: 25-49% accurate Two Step Basic Commands: 0-24% accurate Conversation: Simple Interfering Components: Attention EffectiveTechniques: Visual/Gestural cues;Repetition Visual Recognition/Discrimination Discrimination: Exceptions to [facility] Common Objects: Unable to indentify    Expression Verbal Expression Overall Verbal Expression: Impaired Initiation: No impairment Automatic Speech:  (impaired - perseverating in all trials) Level of Generative/Spontaneous Verbalization: Phrase Repetition: No impairment Naming: Impairment Responsive: Not tested Confrontation: Impaired Common Objects: Unable to indentify Convergent: Not tested Divergent: Not tested Verbal Errors: Perseveration Pragmatics: Impairment Impairments: Eye contact (right neglect) Written Expression Dominant Hand: Right   Oral / Motor Oral Motor/Sensory Function Overall Oral Motor/Sensory Function: Appears within functional limits for tasks assessed   GO    Harlon Ditty, MA CCC-SLP [phone]  Claudine Mouton 08/27/2012, 2:08 PM

## 2012-08-27 NOTE — Evaluation (Addendum)
Physical Therapy Evaluation Patient Details Name: Donna [name] MRN: [id number] DOB: [birth year] Today's Date: 08/27/2012 Time: 0917-0950 PT Time Calculation (min): 33 min  PT Assessment / Plan / Recommendation Clinical Impression  56 yo found unresponsive next to empty Oxychodone bottle.  Seizure en route.  Intubated for airway protection.  Drug screen positive for cocaine.  STEMI, seen by Cardiology, not a candidate for emergent cardiac catheterization.  Heparin completed.  ASA continued.  Beta blockers / statins contraindicated.  Antibiotics d/c'd as more likely pneumonitis than pneumonia.  Self extubated 12/14.  Pt presenting with stroke like symptoms with right sided weakness, inattention to right side during ambulation, tongue deviated to left side.  Pt also with cognitive impairments noted with focused attention, impulsive nature and inconsistently following one step commands.  Pt will benefit from acute PT services to improve overall mobility and prepare for safe d/c home.  MD and RN notified of stroke like symptoms however no new orders at this time.    PT Assessment  Patient needs continued PT services    Follow Up Recommendations  CIR    Does the patient have the potential to tolerate intense rehabilitation      Barriers to Discharge  (Need to further address (A) available)      Equipment Recommendations  None recommended by PT    Recommendations for Other Services Rehab consult   Frequency Min 3X/week    Precautions / Restrictions Precautions Precautions: Fall Restrictions Weight Bearing Restrictions: No   Pertinent Vitals/Pain Unable to rate      Mobility  Bed Mobility Bed Mobility: Supine to Sit;Sitting - Scoot to Edge of Bed Supine to Sit: 4: Min assist;With rails Sitting - Scoot to [place] of Bed: 4: Min assist Details for Bed Mobility Assistance: (A) to elevate trunk OOB with cues for proper technique.  (A) to scoot hips to EOB with pad Transfers Transfers:  Sit to Stand;Stand to Sit Sit to Stand: 1: +2 Total assist;From bed Sit to Stand: Patient Percentage: 60% Stand to Sit: 1: +2 Total assist;To bed Stand to Sit: Patient Percentage: 60% Details for Transfer Assistance: +2 (A) to initiate transfer and maintain balance.  Max cues for technique.  Pt tends to stay in forward flexed posture and unable to complete upright posture. Pt with occasional right knee buckling and hyperextension. Ambulation/Gait Ambulation/Gait Assistance: 1: +2 Total assist Ambulation/Gait: Patient Percentage: 60% Ambulation Distance (Feet): 30 Feet Assistive device: 2 person hand held assist Ambulation/Gait Assistance Details: +2 (A) to maintain balance.  Pt with right knee buckling and hyperextension.  Pt very impulsive and unable to follow commands limiting proper sequence.  Pt very impulsive and needs constant cues for redirection. Gait Pattern: Step-to pattern;Decreased stance time - right;Decreased hip/knee flexion - right;Shuffle;Trunk flexed;Narrow base of support Gait velocity: decrease gait speed Stairs: No Modified Rankin (Stroke Patients Only) Pre-Morbid Rankin Score:  (unknown PLOF) Modified Rankin: Severe disability    Shoulder Instructions     Exercises     PT Diagnosis: Difficulty walking;Generalized weakness;Abnormality of gait  PT Problem List: Decreased strength;Decreased activity tolerance;Decreased balance;Decreased mobility;Decreased coordination;Decreased cognition;Decreased knowledge of use of DME;Decreased safety awareness;Decreased knowledge of precautions PT Treatment Interventions: DME instruction;Functional mobility training;Therapeutic activities;Therapeutic exercise;Balance training;Neuromuscular re-education;Cognitive remediation;Patient/family education   PT Goals Acute Rehab PT Goals PT Goal Formulation: Patient unable to participate in goal setting Time For Goal Achievement: 09/03/12 Potential to Achieve Goals: Fair Pt will go  Supine/Side to Sit: with supervision PT Goal: Supine/Side to Sit - Progress: Goal  set today Pt will go Sit to Supine/Side: with supervision PT Goal: Sit to Supine/Side - Progress: Goal set today Pt will go Sit to Stand: with min assist PT Goal: Sit to Stand - Progress: Goal set today Pt will go Stand to Sit: with min assist PT Goal: Stand to Sit - Progress: Goal set today Pt will Transfer Bed to Chair/Chair to Bed: with min assist PT Transfer Goal: Bed to Chair/Chair to Bed - Progress: Progressing toward goal Pt will Stand: with supervision;1 - 2 min PT Goal: Stand - Progress: Goal set today Pt will Ambulate: >150 feet;with min assist;with least restrictive assistive device PT Goal: Ambulate - Progress: Goal set today  Visit Information  Last PT Received On: 08/27/12 Assistance Needed: +2 PT/OT Co-Evaluation/Treatment: Yes    Subjective Data  Subjective: "Go to bathroom." Patient Stated Goal: unable to set   Prior Functioning  Home Living Additional Comments: Pt unable to give Home environment or PLOF Prior Function Vocation: On disability Comments: Pt unable to provide PLOF and no visitors presents Communication Communication: Receptive difficulties;Expressive difficulties Dominant Hand: Right    Cognition  Overall Cognitive Status: Impaired Area of Impairment: Attention;Following commands;Safety/judgement;Awareness of deficits;Problem solving Arousal/Alertness: Awake/alert Orientation Level: Disoriented to;Place;Time;Situation Behavior During Session: Restless Current Attention Level: Focused Following Commands: Follows one step commands inconsistently Safety/Judgement: Decreased awareness of safety precautions;Impulsive Safety/Judgement - Other Comments: Pt very impulsive and inattention noted to right side Problem Solving: Pt slow to process at times Cognition - Other Comments: Pt called toothbrush an ink pin but able to correctly state purpose of toothbrush.Pt  perseverates often    Extremity/Trunk Assessment Right Upper Extremity Assessment RUE ROM/Strength/Tone: Deficits;Unable to fully assess;Due to impaired cognition RUE ROM/Strength/Tone Deficits: Unable to fully assess strength/coordination due to cogntion. However, pt demonstrates right inattention, not using RUE for most bil UE tasks. Also, pt with claw like position at rest and tone when therapist attempting to break- pt is able to easily break tone upon command.  Left Upper Extremity Assessment LUE ROM/Strength/Tone: [facility] for tasks assessed Right Lower Extremity Assessment RLE ROM/Strength/Tone: Unable to fully assess;Due to impaired cognition;Deficits RLE ROM/Strength/Tone Deficits: Noticeable right knee hyperextension with ambulation and decrease stance time on right side.  Unable to fully assess due to cognition and pt unable to follow commands RLE Coordination: Deficits RLE Coordination Deficits: Coordination impaired with ambulaiton Left Lower Extremity Assessment LLE ROM/Strength/Tone: Unable to fully assess;Due to impaired cognition   Balance Balance Balance Assessed: Yes Static Sitting Balance Static Sitting - Balance Support: Feet supported Static Sitting - Level of Assistance: 4: Min assist;5: Stand by assistance Static Sitting - Comment/# of Minutes: Occasional min (A) to maintain balance due to impulsive nature Static Standing Balance Static Standing - Balance Support: During functional activity Static Standing - Level of Assistance: 2: Max assist;3: Mod assist Static Standing - Comment/# of Minutes: ~3 minutes to wash hands; (A) to maintain balance.  Pt tends to attempt to sit down without warning and goes into flexed position.  Pt tends to lean left side with inattention to right side.  End of Session PT - End of Session Equipment Utilized During Treatment: Gait belt Activity Tolerance: Patient tolerated treatment well Patient left: in bed;with call bell/phone within  reach Nurse Communication: Mobility status (Notified MD and RN of new signs of stroke)  GP     Dorethea Strubel 08/27/2012, 2:11 PM [place], PT DPT [phone]

## 2012-08-27 NOTE — Evaluation (Signed)
Occupational Therapy Evaluation Patient Details Name: Donna [name] MRN: [id number] DOB: March 08, 1956 Today's Date: 08/27/2012 Time: 4098-1191 OT Time Calculation (min): 33 min  OT Assessment / Plan / Recommendation Clinical Impression  56 yo found unresponsive next to empty Oxychodone bottle.  Seizure en route.  Intubated for airway protection.  Drug screen positive for cocaine.  STEMI, seen by Cardiology, not a candidate for emergent cardiac catheterization.  Heparin completed.  ASA continued.  Beta blockers / statins contraindicated.  Antibiotics d/c'd as more likely pneumonitis than pneumonia.  Self extubated 12/14.  Pt presenting with stroke like symptoms with right sided weakness, inattention to right side during ambulation, tongue deviated to left side.  Pt also with cognitive impairments noted with focused attention, impulsive nature and inconsistently following one step commands. Pt will benefit from skilled OT in the acute setting to maximize I with ADL and ADL mobility prior to d/c.     OT Assessment  Patient needs continued OT Services    Follow Up Recommendations  CIR    Barriers to Discharge      Equipment Recommendations   (TBD)    Recommendations for Other Services Rehab consult  Frequency  Min 2X/week    Precautions / Restrictions Precautions Precautions: Fall Restrictions Weight Bearing Restrictions: No   Pertinent Vitals/Pain Pt reported stomach pain initially but states this resolved with urination.    ADL  Grooming: Wash/dry hands;Moderate assistance Where Assessed - Grooming: Supported standing Upper Body Dressing: Moderate assistance Where Assessed - Upper Body Dressing: Supported sitting Lower Body Dressing: Moderate assistance Where Assessed - Lower Body Dressing: Supported sit to Pharmacist, hospital: +2 Total assistance Toilet Transfer: Patient Percentage: 60% Toilet Transfer Method: Sit to Barista: Bedside  commode Toileting - Clothing Manipulation and Hygiene: Minimal assistance Where Assessed - Engineer, mining and Hygiene: Standing Equipment Used: Gait belt Transfers/Ambulation Related to ADLs: +2total(pt=60%) sit to stand and for ambulation. Pt with hyperextension RLE and consistently attempts to cross midline to the left with RLE when taking steps. Pt with Rt sided inattention ADL Comments: Pt presents as CVA vs anoxic BI.     OT Diagnosis: Generalized weakness;Cognitive deficits;Disturbance of vision;Acute pain;Paresis;Apraxia  OT Problem List: Decreased strength;Decreased activity tolerance;Impaired balance (sitting and/or standing);Impaired vision/perception;Decreased coordination;Decreased cognition;Decreased safety awareness;Decreased knowledge of use of DME or AE;Decreased knowledge of precautions;Pain;Impaired UE functional use;Impaired tone OT Treatment Interventions: Self-care/ADL training;DME and/or AE instruction;Therapeutic activities;Cognitive remediation/compensation;Visual/perceptual remediation/compensation;Patient/family education;Balance training   OT Goals Acute Rehab OT Goals OT Goal Formulation: With patient Time For Goal Achievement: 09/10/12 Potential to Achieve Goals: Good ADL Goals Pt Will Perform Grooming: with supervision;Standing at sink ADL Goal: Grooming - Progress: Goal set today Pt Will Perform Upper Body Dressing: with set-up;Sitting, chair;Sitting, bed ADL Goal: Upper Body Dressing - Progress: Goal set today Pt Will Perform Lower Body Dressing: with set-up;with supervision;Sit to stand from bed;Sit to stand from chair ADL Goal: Lower Body Dressing - Progress: Goal set today Pt Will Transfer to Toilet: Ambulation;with DME;with min assist ADL Goal: Toilet Transfer - Progress: Goal set today Pt Will Perform Toileting - Clothing Manipulation: Sitting on 3-in-1 or toilet;Standing;with min assist ADL Goal: Toileting - Clothing Manipulation -  Progress: Goal set today Pt Will Perform Toileting - Hygiene: with supervision;Sitting on 3-in-1 or toilet ADL Goal: Toileting - Hygiene - Progress: Goal set today Additional ADL Goal #1: Pt will locate 5/5 objects throughout the room with Min VC's in prep for ADL activities ADL Goal: Additional Goal #1 -  Progress: Goal set today  Visit Information  Last OT Received On: 08/27/12 Assistance Needed: +2    Subjective Data  Subjective: My tummy. (when asked if she was in any pain) Patient Stated Goal: "Go home"   Prior Functioning     Home Living Additional Comments: Pt unable to give Home environment or PLOF Prior Function Vocation: On disability Comments: Pt unable to provide PLOF and no visitors presents Communication Communication: Receptive difficulties;Expressive difficulties Dominant Hand: Right         Vision/Perception Vision - Assessment Eye Alignment: Within Functional Limits Vision Assessment: Vision impaired - to be further tested in functional context Additional Comments: pt appears to have full extraocular ROM. Unable to test for field cuts due to cognition but demonstrates actions consistent with right sided inattention during ambulation and most functional tasks   Cognition  Overall Cognitive Status: Impaired Area of Impairment: Attention;Following commands;Safety/judgement;Awareness of deficits;Problem solving Arousal/Alertness: Awake/alert Orientation Level: Disoriented to;Place;Time;Situation Behavior During Session: Restless Current Attention Level: Focused Following Commands: Follows one step commands inconsistently Safety/Judgement: Decreased awareness of safety precautions;Impulsive Safety/Judgement - Other Comments: Pt very impulsive and inattention noted to right side Problem Solving: Pt slow to process at times Cognition - Other Comments: Pt called toothbrush an ink pin but able to correctly state purpose of toothbrush.Pt perseverates often     Extremity/Trunk Assessment Right Upper Extremity Assessment RUE ROM/Strength/Tone: Deficits;Unable to fully assess;Due to impaired cognition RUE ROM/Strength/Tone Deficits: Unable to fully assess strength/coordination due to cogntion. However, pt demonstrates right inattention, not using RUE for most bil UE tasks. Also, pt with claw like position at rest and tone when therapist attempting to break- pt is able to easily break tone upon command.  Left Upper Extremity Assessment LUE ROM/Strength/Tone: [facility] for tasks assessed Right Lower Extremity Assessment RLE ROM/Strength/Tone: Unable to fully assess;Due to impaired cognition;Deficits RLE ROM/Strength/Tone Deficits: Noticeable right knee hyperextension with ambulation and decrease stance time on right side.  Unable to fully assess due to cognition and pt unable to follow commands RLE Coordination: Deficits RLE Coordination Deficits: Coordination impaired with ambulaiton Left Lower Extremity Assessment LLE ROM/Strength/Tone: Unable to fully assess;Due to impaired cognition     Mobility Bed Mobility Bed Mobility: Supine to Sit;Sitting - Scoot to Edge of Bed Supine to Sit: 4: Min assist;With rails Sitting - Scoot to [place] of Bed: 4: Min assist Details for Bed Mobility Assistance: (A) to elevate trunk OOB with cues for proper technique.  (A) to scoot hips to EOB with pad Transfers Sit to Stand: 1: +2 Total assist;From bed Sit to Stand: Patient Percentage: 60% Stand to Sit: 1: +2 Total assist;To bed Stand to Sit: Patient Percentage: 60% Details for Transfer Assistance: +2 (A) to initiate transfer and maintain balance.  Max cues for technique.  Pt tends to stay in forward flexed posture and unable to complete upright posture. Pt with occasional right knee buckling and hyperextension.     Shoulder Instructions     Exercise     Balance Balance Balance Assessed: Yes Static Sitting Balance Static Sitting - Balance Support: Feet  supported Static Sitting - Level of Assistance: 4: Min assist;5: Stand by assistance Static Sitting - Comment/# of Minutes: Occasional min (A) to maintain balance due to impulsive nature Static Standing Balance Static Standing - Balance Support: During functional activity Static Standing - Level of Assistance: 2: Max assist;3: Mod assist Static Standing - Comment/# of Minutes: ~3 minutes to wash hands; (A) to maintain balance.  Pt tends to attempt to  sit down without warning and goes into flexed position.  Pt tends to lean left side with inattention to right side.   End of Session OT - End of Session Equipment Utilized During Treatment: Gait belt Activity Tolerance: Patient tolerated treatment well Patient left: in bed;with call bell/phone within reach (sitter in room; restraints left off per sitter request) Nurse Communication: Mobility status  GO     Kendrah Lovern 08/27/2012, 2:17 PM

## 2012-08-27 NOTE — Evaluation (Signed)
Clinical/Bedside Swallow Evaluation Patient Details  Name: Donna [name] MRN: [id number] Date of Birth: Aug 26, 1956  Today's Date: 08/27/2012 Time: 0109-3235 SLP Time Calculation (min): 41 min  Past Medical History:  Past Medical History  Diagnosis Date  . Coronary artery disease   . Degenerative disc disease   . Asthma   . Lupus   . Hepatitis C   . Heart attack   . Anxiety   . Obsessive-compulsive disorder   . Depression   . PTSD (post-traumatic stress disorder)   . Seizures    Past Surgical History:  Past Surgical History  Procedure Date  . Cholecystectomy   . Appendectomy   . Tonsillectomy   . Tubal ligation   . Polyps on vocal cords   . Cardiac catheterization   . Esophagogastroduodenoscopy 06/02/2012    Procedure: ESOPHAGOGASTRODUODENOSCOPY (EGD);  Surgeon: Malissa Hippo, MD;  Location: [facility];  Service: Endoscopy;  Laterality: N/A;  . 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  Seizure en route.  Intubated for airway protection.  Drug screen positive for cocaine.  STEMI, seen by Cardiology, not a candidate for emergent cardiac catheterization.  Heparin completed.  ASA continued.  Beta blockers / statins contraindicated.  Antibiotics d/c'd as more likely pneumonitis than pneumonia.  Self extubated 12/14, doing well.treat agitation, benzo WD?  PT OT reports concern for right sided weakness prior to SLp swallow eval. MD agreed to cognitive linguistic eval.    Assessment / Plan / Recommendation Clinical Impression  Pt presents with delayed, overt signs of aspiration indicative of both decreased airway protection as well as reduced sensation. Given these signs, intubation and question of CVA, recommend MBS to objectively assess swallow function prior to initiating diet. NPO until assessment except meds whole in puree, RN aware.     Aspiration Risk  Moderate    Diet Recommendation NPO except meds    Medication Administration: Whole meds with puree    Other  Recommendations Recommended Consults: MBS Oral Care Recommendations: Oral care QID   Follow Up Recommendations  Inpatient Rehab    Frequency and Duration        Pertinent Vitals/Pain NA    SLP Swallow Goals     Swallow Study Prior Functional Status       General HPI: 56 yo found unresponsive next to empty Oxychodone bottle.  Seizure en route.  Intubated for airway protection.  Drug screen positive for cocaine.  STEMI, seen by Cardiology, not a candidate for emergent cardiac catheterization.  Heparin completed.  ASA continued.  Beta blockers / statins contraindicated.  Antibiotics d/c'd as more likely pneumonitis than pneumonia.  Self extubated 12/14, doing well.treat agitation, benzo WD?  PT OT reports concern for right sided weakness prior to SLp swallow eval. MD agreed to cognitive linguistic eval.  Type of Study: Bedside swallow evaluation Diet Prior to this Study: NPO;Panda Temperature Spikes Noted: No Respiratory Status: Room air History of Recent Intubation: Yes Length of Intubations (days): 2 days Date extubated: 08/24/12 Behavior/Cognition: Alert;Cooperative;Confused;Pleasant mood Oral Cavity - Dentition: Missing dentition Self-Feeding Abilities: Able to feed self;Needs assist Patient Positioning: Upright in bed Baseline Vocal Quality: Clear Volitional Cough: Strong Volitional Swallow: Able to elicit    Oral/Motor/Sensory Function Overall Oral Motor/Sensory Function: Appears within functional limits for tasks assessed Labial ROM: Within Functional Limits Labial Symmetry: Within Functional Limits Labial Strength: Within Functional Limits Labial Sensation: Within Functional Limits Lingual ROM: Within Functional Limits Lingual Symmetry:  Within Functional Limits Lingual Strength: Within Functional Limits   Ice Chips Ice chips: Not tested   Thin Liquid Thin Liquid: Impaired Presentation: Cup;Straw;Self  Fed Pharyngeal  Phase Impairments: Cough - Delayed    Nectar Thick Nectar Thick Liquid: Not tested   Honey Thick Honey Thick Liquid: Not tested   Puree Puree: Within functional limits   Solid   GO    Solid: Within functional limits      [facility], [place] CCC-SLP [phone]  Donna [name] 08/27/2012,12:09 PM

## 2012-08-27 NOTE — Progress Notes (Addendum)
Rehab Admissions Coordinator Note:  Patient was screened by Clois Dupes for appropriateness for an Inpatient Acute Rehab Consult. Noted PT, OT, and SLP are recommending an inpt rehab consult. I will contact Dr. Butler Denmark for an order. I need a SLP cognition evaluation please.   Clois Dupes, RN 08/27/2012, 5:33 PM  I can be reached at [phone].

## 2012-08-27 NOTE — Progress Notes (Signed)
CARDIOLOGY CONSULT NOTE  Patient ID: Donna [name] MRN: [id number] DOB/AGE: [birth year] 57 y.o.  Admit date: 08/20/2012 Primary Physician Louie Boston, MD Primary Cardiologist None Chief Complaint  NQWMI  HPI:  The patient was admitted on 12/10 with Oxycodone overdose. Drug screen was positive for benzodiazepines and cocaine.   She was intubated upon admission for airway protection following a seizure during transport to the hospital.  She was noted on admission to have ST elevation on EKG in the anterior leads.  However, given the multiple ongoing issues Dr. Jens Som, who reviewed the EKG, did not feel that urgent cardiac cath was indicated.  She was admitted to the CCM service.  She has been managed for AMS, acute renal insufficiency, seizure.  She self extubated on 12/14.  Her troponin did peak at 13.95.  Echo shows an EF of 50% with apical akinesis.  This wall motion was present on pervious echo and she was felt to have hypertrophic cardiomyopathy mid-cavity variant.  Of note the patient did have a cardiac cath in 2011 with normal coronary arteries.  She was evaluated at that time for chest pain with elevated troponin.    On talking to the patient is very clear that she is confused. She actually first misstated her last name. She didn't know she was at [facility].  However, she does not understand the circumstances of her admission and does not know the date. Therefore, obtaining any meaningful history is difficult. I did extensively review past records and this admission. She does report chest pain but cannot qualify or quantify this. She doesn't describe any shortness of breath. However, her answer changes frequently in the same question.   Past Medical History  Diagnosis Date  . Coronary artery disease   . Degenerative disc disease   . Asthma   . Lupus   . Hepatitis C   . Heart attack   . Anxiety   . Obsessive-compulsive disorder   . Depression   . PTSD (post-traumatic stress  disorder)   . Seizures     Past Surgical History  Procedure Date  . Cholecystectomy   . Appendectomy   . Tonsillectomy   . Tubal ligation   . Polyps on vocal cords   . Cardiac catheterization   . Esophagogastroduodenoscopy 06/02/2012    Procedure: ESOPHAGOGASTRODUODENOSCOPY (EGD);  Surgeon: Malissa Hippo, MD;  [facility]: [facility];  Service: Endoscopy;  Laterality: N/A;  . 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 Corn-Containing Products Shortness Of Breath and Swelling    REACTION: affects breathing  . Eggs Or Egg-Derived Products Shortness Of Breath  . Latex Shortness Of Breath, Itching and Rash  . Codeine Itching and Nausea And Vomiting  . Penicillins Other (See Comments)    REACTION: "thrush"  . Sulfa Antibiotics Other (See Comments)    REACTION: Unknown   Prescriptions prior to admission  Medication Sig Dispense Refill  . ALPRAZolam (XANAX) 1 MG tablet Take 1 tablet (1 mg total) by mouth 3 (three) times daily before meals.  30 tablet  0  . budesonide-formoterol (SYMBICORT) 160-4.5 MCG/ACT inhaler Inhale 2 puffs into the lungs 2 (two) times daily.  1 Inhaler  12  . HYDROcodone-acetaminophen (NORCO/VICODIN) 5-325 MG per tablet Take 1 tablet by mouth every 6 (six) hours as needed.      Marland Kitchen ipratropium-albuterol (DUONEB) 0.5-2.5 (3) MG/3ML SOLN Take 3 mLs by nebulization 2 (two) times daily.      Marland Kitchen lisinopril (PRINIVIL,ZESTRIL) 20  MG tablet Take 1 tablet (20 mg total) by mouth daily.  30 tablet  0  . metoprolol (LOPRESSOR) 50 MG tablet Take 50 mg by mouth 2 (two) times daily.      . pantoprazole (PROTONIX) 40 MG tablet Take 40 mg by mouth daily.      Marland Kitchen PARoxetine (PAXIL) 40 MG tablet Take 40 mg by mouth every morning.      Marland Kitchen acetaminophen (TYLENOL) 500 MG tablet Take 500 mg by mouth every 6 (six) hours as needed. pain      . albuterol (PROAIR HFA) 108 (90 BASE) MCG/ACT inhaler Inhale 2 puffs into the lungs every 6 (six) hours as needed.  Shortness of breath      . amitriptyline (ELAVIL) 25 MG tablet Take 1 tablet (25 mg total) by mouth at bedtime.  30 tablet  1  . aspirin EC 81 MG tablet Take 1 tablet (81 mg total) by mouth daily.      . carbamazepine (TEGRETOL-XR) 200 MG 12 hr tablet Start with one the first night, may add a second if not asleep in 1 hour. Then 2 the next night and may add the third dose if not asleep in one hour.  Stay on 3 at night. For seizures and brain damage  90 tablet  1  . celecoxib (CELEBREX) 100 MG capsule Take 1 capsule (100 mg total) by mouth 2 (two) times daily.  60 capsule  2  . cetirizine (ZYRTEC) 10 MG tablet Take 10 mg by mouth daily.      . DULoxetine (CYMBALTA) 30 MG capsule Take 1 capsule (30 mg total) by mouth 2 (two) times daily.  60 capsule  1  . folic acid (FOLVITE) 1 MG tablet Take 1 tablet (1 mg total) by mouth daily.  30 tablet  0  . gabapentin (NEURONTIN) 100 MG capsule Take by mouth FOUR times a day start with one for each dose, then increase to two or three for each dose.  It may cause initial drunk feeling. Eventually will be on 900 mg four times a day.  240 capsule  2  . lidocaine (LIDODERM) 5 % Place 4 patches onto the skin every 12 (twelve) hours. Remove & Discard patch within 12 hours or as directed by MD place over pain sites. USE tape on all four sides  10 patch  0  . meloxicam (MOBIC) 7.5 MG tablet Take 1 tablet (7.5 mg total) by mouth 2 (two) times daily.  60 tablet  1  . nystatin (MYCOSTATIN) 100000 UNIT/ML suspension Take 5 mLs by mouth 4 (four) times daily.      Marland Kitchen thiamine 100 MG tablet Take 1 tablet (100 mg total) by mouth daily.  30 tablet  0  . tiotropium (SPIRIVA HANDIHALER) 18 MCG inhalation capsule Place 1 capsule (18 mcg total) into inhaler and inhale daily.  30 capsule  12   Family History  Problem Relation Age of Onset  . Depression Mother   . Alcohol abuse Father   . ADD / ADHD Sister   . Drug abuse Sister   . Anxiety disorder Sister   . OCD Sister   . ADD /  ADHD Brother   . Alcohol abuse Brother   . Drug abuse Brother   . Anxiety disorder Brother   . ADD / ADHD Sister   . Drug abuse Sister   . Anxiety disorder Sister   . Paranoid behavior Sister   . ADD / ADHD Son  History   Social History  . Marital Status: Widowed    Spouse Name: N/A    Number of Children: N/A  . Years of Education: N/A   Occupational History  . Not on file.   Social History Main Topics  . Smoking status: Former Smoker -- 0.1 packs/day for 44 years    Types: Cigarettes    Quit date: 05/27/2012  . Smokeless tobacco: Former Neurosurgeon    Quit date: 05/27/2012     Comment: using e-cig also, cutting back  . Alcohol Use: No     Comment: occ  . Drug Use: No  . Sexually Active: Not on file   Other Topics Concern  . Not on file   Social History Narrative  . No narrative on file     ROS:  As stated in the HPI and negative for all other systems. (Essentially unobtainable however given her confusion.)  Physical Exam: Blood pressure 141/89, pulse 106, temperature 97.4 F (36.3 C), temperature source Oral, resp. rate 23, height 5' 0.24" (1.53 m), weight 130 lb (58.968 kg), SpO2 97.00%.  GENERAL:  Disheveled appearing HEENT:  Pupils equal round and reactive, fundi not visualized, oral mucosa unremarkable NECK:  No jugular venous distention, waveform within normal limits, carotid upstroke brisk and symmetric, no bruits, no thyromegaly LYMPHATICS:  No cervical, inguinal adenopathy LUNGS:  Clear to auscultation bilaterally BACK:  No CVA tenderness, multiple bruises on her back  CHEST:  Unremarkable HEART:  PMI not displaced or sustained,S1 and S2 within normal limits, no S3, no S4, no clicks, no rubs, no murmurs ABD:  Flat, positive bowel sounds normal in frequency in pitch, no bruits, no rebound, no guarding, no midline pulsatile mass, no hepatomegaly, no splenomegaly EXT:  2 plus pulses throughout, no edema, no cyanosis no clubbing SKIN:  No rashes no  nodules NEURO:  Cranial nerves II through XII grossly intact, motor grossly intact throughout PSYCH:  Oriented to place but not to time and date. She is oriented to person but not situation.   Labs: Lab Results  Component Value Date   BUN <3* 08/27/2012   Lab Results  Component Value Date   CREATININE 0.52 08/27/2012   Lab Results  Component Value Date   NA 141 08/27/2012   K 3.5 08/27/2012   CL 104 08/27/2012   CO2 27 08/27/2012   Lab Results  Component Value Date   CKTOTAL 3339* 08/20/2012   CKMB 60.5* 08/20/2012   TROPONINI 3.86* 08/21/2012   Lab Results  Component Value Date   WBC 5.2 08/27/2012   HGB 14.4 08/27/2012   HCT 43.0 08/27/2012   MCV 90.9 08/27/2012   PLT 169 08/27/2012    Lab Results  Component Value Date   ALT 112* 08/24/2012   AST 66* 08/24/2012   ALKPHOS 78 08/24/2012   BILITOT 0.2* 08/24/2012     Radiology:  CXR 1. Stable and satisfactory support apparatus.  2. No significant interval change in the appearance of bilateral  air space opacities and mild cardiomegaly.  EKG:  NSR, rate 102, anterior T wave inversion, resolution of inferior and lateral ST elevation.  08/27/2012  ASSESSMENT AND PLAN:   NQWMI: Ideally the patient should have cardiac catheterization. However, I think she would need to clear significantly mentally first. For now she should continue medical management.  She will continue on aspirin. I will avoid beta blockers given the absence of symptoms and in fact this may have been cocaine induced vasospasm.  If she cannot  eventually consent to cardiac catheterization we would need to pursue perfusion imaging.  HTN:  I will start once daily lisinopril which she was taking at home. I do believe her renal function should be able to tolerate this.   CARDIOMYOPATHY: This will be evaluated as above.     SignedRollene Rotunda 08/27/2012, 2:19 PM

## 2012-08-27 NOTE — Progress Notes (Addendum)
Clinical Social Work Department CLINICAL SOCIAL WORK PLACEMENT NOTE 08/27/2012  Patient:  Donna [name], Donna [name]  Account Number:  [id number] Admit date:  08/20/2012  Clinical Social Worker:  Unk Lightning, LCSW  Date/time:  08/27/2012 12:00 N  Clinical Social Work is seeking post-discharge placement for this patient at the following level of care:   SKILLED NURSING   (*CSW will update this form in Epic as items are completed)   08/27/2012  Patient/family provided with [facility] Department of Clinical Social Work's list of facilities offering this level of care within the geographic area requested by the patient (or if unable, by the patient's family).  08/27/2012  Patient/family informed of their freedom to choose among providers that offer the needed level of care, that participate in Medicare, Medicaid or managed care program needed by the patient, have an available bed and are willing to accept the patient.  08/27/2012  Patient/family informed of [facility]' ownership interest in [facility], as well as of the fact that they are under no obligation to receive care at this facility.  PASARR submitted to EDS on 08/27/2012 PASARR number received from [employer]DS on 08/30/12  FL2 transmitted to all facilities in geographic area requested by pt/family on  08/27/2012 FL2 transmitted to all facilities within larger geographic area on   Patient informed that his/her managed care company has contracts with or will negotiate with  certain facilities, including the following:     Patient/family informed of bed offers received:  08/1912 Patient chooses bed at [facility] Physician recommends and patient chooses bed at    Patient to be transferred to  on   Patient to be transferred to facility by   The following physician request were entered in Epic:   Additional Comments:

## 2012-08-27 NOTE — Progress Notes (Signed)
Right nare NG tube (along with TF and free water flushes) d/c'd per MD order, patient tolerated well, will continue to monitor.

## 2012-08-27 NOTE — Consult Note (Signed)
Patient Identification:  Donna [name] Date of Evaluation:  08/27/2012 Reason for Consult:  Overdose of Oxycontin, R/O intentional OD  Referring Provider: Dr. Tyson Alias  History of Present Illness:Pt found with overdose of OxyContin, unresponsive.  She has been intubated, extubated and for several days was unresponsive.  She is alert but very confused.  Will continue to evaluate mental status with anticipation she may become more oriented to participate in an evaluation  Past Psychiatric History:Pt has chronic history of drug abuse, suicide attempts.  Son has provided some history when pt  [his mother] has been unresponsive.    Past Medical History:     Past Medical History  Diagnosis Date  . Coronary artery disease   . Degenerative disc disease   . Asthma   . Lupus   . Hepatitis C   . Heart attack   . Anxiety   . Obsessive-compulsive disorder   . Depression   . PTSD (post-traumatic stress disorder)   . Seizures        Past Surgical History  Procedure Date  . Cholecystectomy   . Appendectomy   . Tonsillectomy   . Tubal ligation   . Polyps on vocal cords   . Cardiac catheterization   . Esophagogastroduodenoscopy 06/02/2012    Procedure: ESOPHAGOGASTRODUODENOSCOPY (EGD);  Surgeon: Malissa Hippo, MD;  Location[facility];  Service: Endoscopy;  Laterality: N/A;  . 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 Corn-Containing Products Shortness Of Breath and Swelling    REACTION: affects breathing  . Eggs Or Egg-Derived Products Shortness Of Breath  . Latex Shortness Of Breath, Itching and Rash  . Codeine Itching and Nausea And Vomiting  . Penicillins Other (See Comments)    REACTION: "thrush"  . Sulfa Antibiotics Other (See Comments)    REACTION: Unknown    Current Medications:  Prior to Admission medications   Medication Sig Start Date End Date Taking? Authorizing Provider  ALPRAZolam Prudy Feeler) 1 MG tablet Take 1  tablet (1 mg total) by mouth 3 (three) times daily before meals. 06/04/12  Yes Nimish Normajean Glasgow, MD  budesonide-formoterol (SYMBICORT) 160-4.5 MCG/ACT inhaler Inhale 2 puffs into the lungs 2 (two) times daily. 04/26/12 04/26/13 Yes Zannie Cove, MD  HYDROcodone-acetaminophen (NORCO/VICODIN) 5-325 MG per tablet Take 1 tablet by mouth every 6 (six) hours as needed.   Yes Historical Provider, MD  ipratropium-albuterol (DUONEB) 0.5-2.5 (3) MG/3ML SOLN Take 3 mLs by nebulization 2 (two) times daily.   Yes Historical Provider, MD  lisinopril (PRINIVIL,ZESTRIL) 20 MG tablet Take 1 tablet (20 mg total) by mouth daily. 06/04/12  Yes Nimish Normajean Glasgow, MD  metoprolol (LOPRESSOR) 50 MG tablet Take 50 mg by mouth 2 (two) times daily.   Yes Historical Provider, MD  pantoprazole (PROTONIX) 40 MG tablet Take 40 mg by mouth daily.   Yes Historical Provider, MD  PARoxetine (PAXIL) 40 MG tablet Take 40 mg by mouth every morning.   Yes Historical Provider, MD  acetaminophen (TYLENOL) 500 MG tablet Take 500 mg by mouth every 6 (six) hours as needed. pain    Historical Provider, MD  albuterol (PROAIR HFA) 108 (90 BASE) MCG/ACT inhaler Inhale 2 puffs into the lungs every 6 (six) hours as needed. Shortness of breath    Historical Provider, MD  amitriptyline (ELAVIL) 25 MG tablet Take 1 tablet (25 mg total) by mouth at bedtime. 07/18/12   Mike Craze, MD  aspirin EC 81 MG tablet Take 1  tablet (81 mg total) by mouth daily. 05/09/12 05/09/13  Prescott Parma, PA  carbamazepine (TEGRETOL-XR) 200 MG 12 hr tablet Start with one the first night, may add a second if not asleep in 1 hour. Then 2 the next night and may add the third dose if not asleep in one hour.  Stay on 3 at night. For seizures and brain damage 07/18/12   Mike Craze, MD  celecoxib (CELEBREX) 100 MG capsule Take 1 capsule (100 mg total) by mouth 2 (two) times daily. 07/18/12 07/18/13  Mike Craze, MD  cetirizine (ZYRTEC) 10 MG tablet Take 10 mg by mouth daily.     Historical Provider, MD  DULoxetine (CYMBALTA) 30 MG capsule Take 1 capsule (30 mg total) by mouth 2 (two) times daily. 07/18/12   Mike Craze, MD  folic acid (FOLVITE) 1 MG tablet Take 1 tablet (1 mg total) by mouth daily. 06/04/12   Nimish Normajean Glasgow, MD  gabapentin (NEURONTIN) 100 MG capsule Take by mouth FOUR times a day start with one for each dose, then increase to two or three for each dose.  It may cause initial drunk feeling. Eventually will be on 900 mg four times a day. 07/18/12   Mike Craze, MD  lidocaine (LIDODERM) 5 % Place 4 patches onto the skin every 12 (twelve) hours. Remove & Discard patch within 12 hours or as directed by MD place over pain sites. USE tape on all four sides 07/18/12 07/18/13  Mike Craze, MD  meloxicam (MOBIC) 7.5 MG tablet Take 1 tablet (7.5 mg total) by mouth 2 (two) times daily. 07/18/12   Mike Craze, MD  nystatin (MYCOSTATIN) 100000 UNIT/ML suspension Take 5 mLs by mouth 4 (four) times daily.    Historical Provider, MD  thiamine 100 MG tablet Take 1 tablet (100 mg total) by mouth daily. 06/04/12   Nimish Normajean Glasgow, MD  tiotropium (SPIRIVA HANDIHALER) 18 MCG inhalation capsule Place 1 capsule (18 mcg total) into inhaler and inhale daily. 04/26/12 04/26/13  Zannie Cove, MD    Social History:    reports that she quit smoking about 3 months ago. Her smoking use included Cigarettes. She has a 4.4 pack-year smoking history. She quit smokeless tobacco use about 3 months ago. She reports that she does not drink alcohol or use illicit drugs.   Family History:    Family History  Problem Relation Age of Onset  . Depression Mother   . Alcohol abuse Father   . ADD / ADHD Sister   . Drug abuse Sister   . Anxiety disorder Sister   . OCD Sister   . ADD / ADHD Brother   . Alcohol abuse Brother   . Drug abuse Brother   . Anxiety disorder Brother   . ADD / ADHD Sister   . Drug abuse Sister   . Anxiety disorder Sister   . Paranoid behavior Sister   . ADD /  ADHD Son     Mental Status Examination/Evaluation: pending  Assessment/Plan:   Notes reviewed.  Pt with attempted overdose remained unresponsive and currently is too confused for effective evaluation.  Noted: pt is moved to 6733 and will evaluate in am. RECOMMENDATION:  Mickeal Skinner MD 08/27/2012 12:39 PM

## 2012-08-27 NOTE — Progress Notes (Signed)
Clinical Social Work Progress Note PSYCHIATRY SERVICE LINE 08/27/2012  Patient:  SHERLEEN PANGBORN  Account:  [id number]  Admit Date:  08/20/2012  Clinical Social Worker:  Unk Lightning, LCSW  Date/Time:  08/27/2012 12:00 N  Review of Patient  Overall Medical Condition:   Patient still confused and unable to participate in session.   Participation Level:  None  Participation Quality  Other - See comment   Other Participation Quality:   Patient in bed with sitter present. Still confused.   Affect  Anxious   Cognitive  Confused   Reaction to Medications/Concerns:   None reported   Modes of Intervention  Support   Summary of Progress/Plan at Discharge   CSW attempted to meet with patient but patient unable to participate in assessment. Bedside RN reports patient remains confused.    CSW spoke with son regarding options. Recommendation for SNF placement if patient confused at dc. CSW spoke with son via phone regarding SNF placement. Son agreeable to placement. CSW explained process and emailed SNF list to son. CSW completed FL2 and submitted for pasarr.    CSW will continue to follow to complete assessment with patient when appropriate.

## 2012-08-28 ENCOUNTER — Inpatient Hospital Stay (HOSPITAL_COMMUNITY): Payer: Medicare Other

## 2012-08-28 DIAGNOSIS — F1096 Alcohol use, unspecified with alcohol-induced persisting amnestic disorder: Secondary | ICD-10-CM

## 2012-08-28 LAB — CBC
HCT: 43.3 % (ref 36.0–46.0)
Hemoglobin: 14.4 g/dL (ref 12.0–15.0)
RBC: 4.68 MIL/uL (ref 3.87–5.11)
WBC: 4.3 10*3/uL (ref 4.0–10.5)

## 2012-08-28 LAB — BASIC METABOLIC PANEL
BUN: 5 mg/dL — ABNORMAL LOW (ref 6–23)
CO2: 26 mEq/L (ref 19–32)
Chloride: 104 mEq/L (ref 96–112)
Glucose, Bld: 107 mg/dL — ABNORMAL HIGH (ref 70–99)
Potassium: 3.7 mEq/L (ref 3.5–5.1)

## 2012-08-28 MED ORDER — ENSURE PUDDING PO PUDG
1.0000 | Freq: Three times a day (TID) | ORAL | Status: DC
  Administered 2012-08-28 – 2012-09-01 (×10): 1 via ORAL

## 2012-08-28 NOTE — Progress Notes (Signed)
SUBJECTIVE:  She is still very confused.  She denies chest pain or SOB.     PHYSICAL EXAM Filed Vitals:   08/27/12 1600 08/27/12 1900 08/27/12 2102 08/28/12 0539  BP: 146/85 117/83 127/77 133/88  Pulse: 103 108 100 100  Temp: 98 F (36.7 C) 97.8 F (36.6 C) 97.5 F (36.4 C) 97.8 F (36.6 C)  TempSrc: Oral Oral Oral Oral  Resp: 23 20 20 20   Height:      Weight:   130 lb (58.968 kg)   SpO2: 97% 95% 95% 97%   General:  Disheveled. Lungs:  Clear Heart:  RRR Abdomen:  Positive bowel sounds, no rebound no guarding Extremities:  No edema  LABS: Lab Results  Component Value Date   CKTOTAL 3339* 08/20/2012   CKMB 60.5* 08/20/2012   TROPONINI 3.86* 08/21/2012   Results for orders placed during the hospital encounter of 08/20/12 (from the past 24 hour(s))  VITAMIN B12     Status: Abnormal   Collection Time   08/27/12 10:21 AM      Component Value Range   Vitamin B-12 971 (*) 211 - 911 pg/mL  FOLATE     Status: Normal   Collection Time   08/27/12 10:21 AM      Component Value Range   Folate 19.1    IRON AND TIBC     Status: Abnormal   Collection Time   08/27/12 10:21 AM      Component Value Range   Iron 158 (*) 42 - 135 ug/dL   TIBC 960 (*) 454 - 098 ug/dL   Saturation Ratios 72 (*) 20 - 55 %   UIBC 62 (*) 125 - 400 ug/dL  FERRITIN     Status: Normal   Collection Time   08/27/12 10:21 AM      Component Value Range   Ferritin 275  10 - 291 ng/mL  RETICULOCYTES     Status: Normal   Collection Time   08/27/12 10:21 AM      Component Value Range   Retic Ct Pct 0.6  0.4 - 3.1 %   RBC. 5.08  3.87 - 5.11 MIL/uL   Retic Count, Manual 30.5  19.0 - 186.0 K/uL  GLUCOSE, CAPILLARY     Status: Abnormal   Collection Time   08/27/12 11:33 AM      Component Value Range   Glucose-Capillary 100 (*) 70 - 99 mg/dL   Comment 1 Notify RN     Comment 2 Documented in Chart    CBC     Status: Normal   Collection Time   08/28/12  5:15 AM      Component Value Range   WBC 4.3   4.0 - 10.5 K/uL   RBC 4.68  3.87 - 5.11 MIL/uL   Hemoglobin 14.4  12.0 - 15.0 g/dL   HCT 11.9  14.7 - 82.9 %   MCV 92.5  78.0 - 100.0 fL   MCH 30.8  26.0 - 34.0 pg   MCHC 33.3  30.0 - 36.0 g/dL   RDW 56.2  13.0 - 86.5 %   Platelets 189  150 - 400 K/uL    Intake/Output Summary (Last 24 hours) at 08/28/12 0850 Last data filed at 08/28/12 7846  Gross per 24 hour  Intake   1685 ml  Output      0 ml  Net   1685 ml    ASSESSMENT AND PLAN:  NQWMI:  Ideally the patient should have cardiac  catheterization. However, I I doubt that she will clear mentally to the point where she could consent to this or even proceed. Given this I think the best step will be to evaluate her noninvasively. I would do this closer to the time of her discharge. For now she will continue the medicines as listed.  HTN:  I started once daily lisinopril which she was taking at home. We can follow her renal function.    CARDIOMYOPATHY:  This will be evaluated as above.      Rollene Rotunda 08/28/2012 8:50 AM

## 2012-08-28 NOTE — Progress Notes (Signed)
Speech Language Pathology Treatment Patient Details Name: Donna [name] MRN: [id number] DOB: 16-Mar-1956 Today's Date: 08/28/2012 Time: 4098-1191 SLP Time Calculation (min): 36 min  Assessment / Plan / Recommendation Clinical Impression  Treatment session focused on tolerance of nectar thick liquids and soft solids, usage of compensatory strategies,  language facilitation and awareness of deficits. Moderate verbal cues required for small sips due to pt impulsivity, pt responded well to frequent verbal reminders. No signs of aspiration. Moderate semantic and phonemic cues utilized to correct anomia, perseveration and phonemic errors at conversation level. With verbal cues pts awareness of perseveration improved throughout session. Moderate verbal cues required for pt redirect attention to right visual field during verbal and functional task. Note that pt did not use right hand during functional tasks though she is right hand dominant.     SLP Plan  Continue with current plan of care    Pertinent Vitals/Pain NA  SLP Goals  SLP Goals Potential to Achieve Goals: Good Progress/Goals/Alternative treatment plan discussed with pt/caregiver and they: Agree SLP Goal #1: Pt will express biasc wants needs during functional task as needed with min verbal cues.  SLP Goal #1 - Progress: Progressing toward goal SLP Goal #2: Pt will attend to objects in right visual field with min verbal cues during functional problem solving task.  SLP Goal #2 - Progress: Progressing toward goal SLP Goal #3: Pt will follow 1 step commands with 80 % accuracy with min visual cues.  SLP Goal #3 - Progress: Progressing toward goal  General Temperature Spikes Noted: No Respiratory Status: Room air Behavior/Cognition: Alert;Cooperative;Pleasant mood Oral Cavity - Dentition: Missing dentition Patient Positioning: Upright in bed  Oral Cavity - Oral Hygiene Does patient have any of the following "at risk" factors?: Other -  dysphagia;Diet - patient on thickened liquids   Treatment Treatment focused on: Cognition;Aphasia (dysphagia)   GO     Currie Dennin, [name] 08/28/2012, 3:58 PM

## 2012-08-28 NOTE — Progress Notes (Signed)
NUTRITION FOLLOW UP  Intervention:   1. Add Ensure Pudding po TID, each supplement provides 170 kcal and 4 grams of protein, to promote additional protein intake and variety. Continue Magic cup TID with meals, each supplement provides 290 kcal and 9 grams of protein. 2. RD to continue to follow nutrition care plan  Nutrition Dx:   Inadequate oral intake related to inability to eat as evidenced by NPO status, resolved.  New Nutrition Dx: Swallowing difficulty r/t recent intubation and cognitive deficits AEB MBSS results and need for ST.  Goal:  Intake to meet >90% of estimated nutrition needs, met.   Monitor:  PO intake, weight trend, labs, I/O  Assessment:   NG tube and EN discontinued on 12/17. MBSS performed same day, recommending Dysphagia 3 (Mechanical Soft) diet and Nectar-Thickened liquids.  Height: Ht Readings from Last 1 Encounters:  08/21/12 5' 0.24" (1.53 m)    Weight Status:  stable Wt Readings from Last 1 Encounters:  08/27/12 130 lb (58.968 kg)   Re-estimated needs:  Kcal: 1550-1750 Protein: 75-90 gm Fluid: 1.5-1.7 L  Skin: no issues noted  Diet Order: Dysphagia 3 with nectar-liquids   Intake/Output Summary (Last 24 hours) at 08/28/12 1138 Last data filed at 08/28/12 0821  Gross per 24 hour  Intake   1240 ml  Output      0 ml  Net   1240 ml    Last BM: 12/15   Labs:   Lab 08/28/12 0945 08/27/12 0425 08/26/12 2027 08/24/12 0445 08/23/12 0330 08/22/12 0333  NA 141 141 135 -- -- --  K 3.7 3.5 3.2* -- -- --  CL 104 104 100 -- -- --  CO2 26 27 25  -- -- --  BUN 5* <3* <3* -- -- --  CREATININE 0.52 0.52 0.50 -- -- --  CALCIUM 8.4 8.9 8.7 -- -- --  MG -- -- -- 1.6 1.9 2.1  PHOS -- -- -- 2.4 2.5 2.1*  GLUCOSE 107* 113* 100* -- -- --   CBG (last 3)   Basename 08/27/12 1133 08/27/12 0743 08/27/12 0320  GLUCAP 100* 110* 93    Scheduled Meds:    . ALPRAZolam  0.5 mg Oral TID  . amitriptyline  25 mg Oral QHS  . antiseptic oral rinse  15 mL  Mouth Rinse QID  . aspirin  325 mg Oral Daily  . carBAMazepine  400 mg Per Tube TID  . celecoxib  100 mg Oral BID  . chlorhexidine  15 mL Mouth Rinse BID  . DULoxetine  30 mg Oral BID  . folic acid  1 mg Oral Daily  . gabapentin  100 mg Oral TID  . heparin subcutaneous  5,000 Units Subcutaneous Q8H  . levETIRAcetam  1,000 mg Oral BID  . lisinopril  10 mg Oral Daily  . thiamine  100 mg Per Tube TID   Continuous Infusions:    . sodium chloride 125 mL/hr at 08/28/12 0410   Jarold Motto MS, RD, LDN Pager: [phone] After-hours pager: [phone]

## 2012-08-28 NOTE — Progress Notes (Signed)
[facility] PROGRESS NOTE  Donna [name] OZH:[id number] DOB: 1955/12/24 DOA: 08/20/2012 PCP: Louie Boston, MD  56 year old female patient was found unresponsive by her neighbors.  O2 sat was 76% in route to the hospital despite EMS placing patient on CPAP. She has a  history of polysubstance abuse including cocaine and noncompliance. Upon arrival to the emergency department at [facility], the patient was quite hypoxic and began having a seizure and was immediately intubated by the emergency room physician. Subsequent EKG revealed anterior ST segment elevation. Cardiologist on call reviewed the EKG. Because of patient's history of prior normal cardiac catheterization in 2011 and other explanatory factors for patient's abnormal EKG and  altered mental status, severe hypoxia and seizure and fevers it was determined code STEMI would not be activated. Because of need for ICU admission and patient subsequently developed recurrent seizures consistent with status epilepticus pulmonary critical care medicine except that the patient in transfer at [facility]. In the interim she was started on a Versed drip. Because of the EKG changes she was also started on IV heparin. She was also given Ativan to help suppress her seizures and was loaded with Keppra.  Additional history was obtained after admission confirming that patient had been found unresponsive with an empty oxycodone bottle next to her. Her urine drug screen was positive for cocaine after arrival. There were concerns that patient may have aspiration pneumonitis so she was briefly given antibiotics. CT of the head was negative despite no dictation of right-sided weakness which apparently is chronic. Patient eventually self extubated on 08/24/2012. She was subsequently transferred out of the ICU on 08/26/2012 [facility].  Assessment/Plan:  Principle problem: STEMI (ST elevation myocardial infarction) due to acute cocaine use  -not felt a candidate for  acute cardiac catheterization . -previous catheterization June 2011 revealed no coronary artery disease  -Troponin peaked at 9 and has trended downward- initial EKG had ST elevation in anterior leads  - likely MI precipitated by concomitant use of cocaine with associated overdose and hypoxemia  -patient has encephalopathy with significant word finding difficulty and cardiology recommend for non invasive treatment for now -Because of cocaine use cannot use nonselective beta blocker  . Added lisinopril -Echocardiogram this admission shows no new regional wall motion abnormalities and demonstrates persistent apical akinesis .  Active Problems:  Acute respiratory failure with hypoxia/ Aspiration pneumonitis  *Multifactorial secondary to  narcotic overdose with resultant hypoxemia and altered mentation requiring intubation  *Likely also has some  aspiration pneumonitis . patient has completed a course of Unasyn  *Continue supportive care   Acute encephalopathy/Wernicke encephalopathy syndrome  *Acute encephalopathy likely from the above-stated metabolic issues  Patent was admitted at [facility] in September with diagnosis of wernicke's encephalopathy. An MRI obtained at that time was suggestive Posterior reversible encephalopathy syndrome( PRES.) apparently  she got better on discharge.  i will check a repeat MRI to see for any acute / subacute stroke. Continue with AED for now. Will follow with neurology if MRI abnormal. -appreciate psych recs. Given that she had substance abuse and  possibly had suicidal ideations, will continue sitter for now  AKI Likely prerenal with hypoperfusion. No resolved  Thrombocytopenia  Resolved  Status epilepticus  Possibly substance induced seizure. No seizure activity since 48 hrs post admission Cont keppra  Hep /Cirrhosis with alcoholism  *Cirrhotic changes seen on previous imaging but total bilirubin normal   Dysphagia  On dys level 3 diet  Hypokalemia   *Resolved  COPD (chronic obstructive  pulmonary disease)  *Stable . Has scattered rhonchi. Cont prn nebs  Hypernatremia  *Resolved   Chronic diastolic congestive heart failure, NYHA class 1  stable  DVT prophylaxis  consults  PCCM  cardiology  psych  Antibiotics:  Levaquin 12/10 x 1  Vancomycin 12/10 x 1  Unasyn 12/10 >>> 12/14      HPI/Subjective: No overnight issues. Appears quite confused  Objective: Filed Vitals:   08/27/12 2102 08/28/12 0539 08/28/12 1009 08/28/12 1425  BP: 127/77 133/88 114/99 123/79  Pulse: 100 100 99 95  Temp: 97.5 F (36.4 C) 97.8 F (36.6 C) 98 F (36.7 C) 98.1 F (36.7 C)  TempSrc: Oral Oral Oral Oral  Resp: 20 20 22 20   Height:      Weight: 58.968 kg (130 lb)     SpO2: 95% 97% 96% 96%    Intake/Output Summary (Last 24 hours) at 08/28/12 1704 Last data filed at 08/28/12 1236  Gross per 24 hour  Intake    850 ml  Output      0 ml  Net    850 ml   Filed Weights   08/26/12 0300 08/27/12 0642 08/27/12 2102  Weight: 60.3 kg (132 lb 15 oz) 58.968 kg (130 lb) 58.968 kg (130 lb)    Exam:   General:  middle aged female  In NAD. Appears dishelved  HEENT: no pallor, moist oral mucosa  Cardiovascular: NS1 & S2, no murmurs  Respiratory: scattered rhonchi b/l  Abdomen: soft, NT, ND, BS+  Ext: warm no edema   CNS: alert, oriented to place and self, has significant word finding difficulty MMSE done by psych today and  appears diminished  Data Reviewed: Basic Metabolic Panel:  Lab 08/28/12 1610 08/27/12 0425 08/26/12 2027 08/26/12 0444 08/25/12 0445 08/24/12 0445 08/23/12 0330 08/22/12 0333  NA 141 141 135 135 141 -- -- --  K 3.7 3.5 3.2* 2.9* 3.0* -- -- --  CL 104 104 100 99 105 -- -- --  CO2 26 27 25 25 26  -- -- --  GLUCOSE 107* 113* 100* 96 104* -- -- --  BUN 5* <3* <3* <3* 6 -- -- --  CREATININE 0.52 0.52 0.50 0.49* 0.50 -- -- --  CALCIUM 8.4 8.9 8.7 9.1 9.0 -- -- --  MG -- -- -- -- -- 1.6 1.9 2.1  PHOS -- -- -- --  -- 2.4 2.5 2.1*   Liver Function Tests:  Lab 08/24/12 0445  AST 66*  ALT 112*  ALKPHOS 78  BILITOT 0.2*  PROT 5.1*  ALBUMIN 2.5*   No results found for this basename: LIPASE:5,AMYLASE:5 in the last 168 hours No results found for this basename: AMMONIA:5 in the last 168 hours CBC:  Lab 08/28/12 0515 08/27/12 0425 08/26/12 0444 08/25/12 0445 08/24/12 0445  WBC 4.3 5.2 6.1 6.8 5.8  NEUTROABS -- -- -- -- --  HGB 14.4 14.4 15.5* 14.5 13.0  HCT 43.3 43.0 45.0 43.7 40.0  MCV 92.5 90.9 90.0 93.6 94.3  PLT 189 169 147* 148* 120*   Cardiac Enzymes: No results found for this basename: CKTOTAL:5,CKMB:5,CKMBINDEX:5,TROPONINI:5 in the last 168 hours BNP (last 3 results)  Basename 04/23/12 0327 04/18/12 1924  PROBNP 19352.0* 16764.0*   CBG:  Lab 08/27/12 1133 08/27/12 0743 08/27/12 0320 08/26/12 2329 08/26/12 1945  GLUCAP 100* 110* 93 97 82    Recent Results (from the past 240 hour(s))  CULTURE, BLOOD (ROUTINE X 2)     Status: Normal   Collection Time   08/20/12  1:59 PM      Component Value Range Status Comment   Specimen Description BLOOD RIGHT HAND   Final    Special Requests BOTTLES DRAWN AEROBIC AND ANAEROBIC 4CC   Final    Culture NO GROWTH 5 DAYS   Final    Report Status 08/25/2012 FINAL   Final   URINE CULTURE     Status: Normal   Collection Time   08/20/12  2:03 PM      Component Value Range Status Comment   Specimen Description URINE, CLEAN CATCH   Final    Special Requests NONE   Final    Culture  Setup Time 08/20/2012 16:50   Final    Colony Count 9,000 COLONIES/ML   Final    Culture INSIGNIFICANT GROWTH   Final    Report Status 08/21/2012 FINAL   Final   CULTURE, BLOOD (ROUTINE X 2)     Status: Normal   Collection Time   08/20/12  2:23 PM      Component Value Range Status Comment   Specimen Description BLOOD LEFT ANTECUBITAL   Final    Special Requests BOTTLES DRAWN AEROBIC ONLY 4CC   Final    Culture NO GROWTH 5 DAYS   Final    Report Status 08/25/2012  FINAL   Final   MRSA PCR SCREENING     Status: Normal   Collection Time   08/20/12  7:22 PM      Component Value Range Status Comment   MRSA by PCR NEGATIVE  NEGATIVE Final      Studies: Dg Swallowing Func-speech Pathology  08/27/2012  [name] Deblois, CCC-SLP     08/27/2012  3:37 PM Objective Swallowing Evaluation: Modified Barium Swallowing Study   Patient Details  Name: ANDREAL VULTAGGIO MRN: [id number] Date of Birth: [birth year]  Today's Date: 08/27/2012 Time: 1303-1330 SLP Time Calculation (min): 27 min  Past Medical History:  Past Medical History  Diagnosis Date  . Coronary artery disease   . Degenerative disc disease   . Asthma   . Lupus   . Hepatitis C   . Anxiety   . Obsessive-compulsive disorder   . Depression   . PTSD (post-traumatic stress disorder)   . Seizures   . Polysubstance abuse    Past Surgical History:  Past Surgical History  Procedure Date  . Cholecystectomy   . Appendectomy   . Tonsillectomy   . Tubal ligation   . Polyps on vocal cords   . Cardiac catheterization   . Esophagogastroduodenoscopy 06/02/2012    Procedure: ESOPHAGOGASTRODUODENOSCOPY (EGD);  Surgeon: Malissa Hippo, MD;  Location: [facility];  Service: Endoscopy;   Laterality: N/A;  . 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   Seizure en route.  Intubated for airway protection.  Drug screen  positive for cocaine.  STEMI, seen by Cardiology, not a candidate  for emergent cardiac catheterization.  Heparin completed.  ASA  continued.  Beta blockers / statins contraindicated.  Antibiotics  d/c'd as more likely pneumonitis than pneumonia.  Self extubated  12/14, doing well.treat agitation, benzo WD?  PT OT reports  concern for right sided weakness prior to SLp swallow eval. MD  agreed to cognitive linguistic eval.      Assessment / Plan / Recommendation Clinical Impression  Clinical impression: Pt presents with moderate sensory based  oropharyngeal deficits  leading to delayed initiation of swallow  response and silent aspiration. Oral  function is likely at  baseline though pt struggled to consume solids due to cognitive  impairment and poor dentition. Pharyngeal swallow characterized  by delay in swallow initation to the pyriform sinuses with silent  aspiration of thin liquids and in one instance nectar thick  liquids. The pts intake is impulsive. With small controlled sips  the pt may tolerate thin liquids but at this time would be at  high risk of aspiration due to cognitive deficits and inability  to follow precautions/strategies. Recommend a dys 3 (mechanical  soft) diet with nectar thick liquids.     Treatment Recommendation  Therapy as outlined in treatment plan below    Diet Recommendation Dysphagia 3 (Mechanical Soft);Nectar-thick  liquid   Liquid Administration via: Cup;No straw Medication Administration: Whole meds with puree Supervision: Patient able to self feed;Full supervision/cueing  for compensatory strategies Compensations: Slow rate;Small sips/bites;Clear throat  intermittently Postural Changes and/or Swallow Maneuvers: Seated upright 90  degrees;Upright 30-60 min after meal    Other  Recommendations Oral Care Recommendations: Oral care BID Other Recommendations: Order thickener from pharmacy   Follow Up Recommendations  Inpatient Rehab    Frequency and Duration min 2x/week  2 weeks   Pertinent Vitals/Pain NA    SLP Swallow Goals Patient will consume recommended diet without observed clinical  signs of aspiration with: Minimal assistance Patient will utilize recommended strategies during swallow to  increase swallowing safety with: Minimal cueing   General HPI: 56 yo found unresponsive next to empty Oxychodone  bottle.  Seizure en route.  Intubated for airway protection.   Drug screen positive for cocaine.  STEMI, seen by Cardiology, not  a candidate for emergent cardiac catheterization.  Heparin  completed.  ASA continued.  Beta blockers / statins   contraindicated.  Antibiotics d/c'd as more likely pneumonitis  than pneumonia.  Self extubated 12/14, doing well.treat  agitation, benzo WD?  PT OT reports concern for right sided  weakness prior to SLp swallow eval. MD agreed to cognitive  linguistic eval.  Type of Study: Modified Barium Swallowing Study Reason for Referral: Objectively evaluate swallowing function Diet Prior to this Study: NPO Respiratory Status: Supplemental O2 delivered via (comment) History of Recent Intubation: Yes Length of Intubations (days): 2 days Date extubated: 08/24/12 Behavior/Cognition: Alert;Cooperative;Confused;Pleasant mood Oral Cavity - Dentition: Missing dentition Oral Motor / Sensory Function: Within functional limits Self-Feeding Abilities: Able to feed self Patient Positioning: Upright in bed Baseline Vocal Quality: Clear Volitional Cough: Weak Volitional Swallow: Able to elicit Anatomy: Within functional limits Pharyngeal Secretions: Not observed secondary MBS    Reason for Referral Objectively evaluate swallowing function   Oral Phase Oral Preparation/Oral Phase Oral Phase: Impaired Oral - Nectar Oral - Nectar Cup: Within functional limits Oral - Thin Oral - Thin Cup: Within functional limits Oral - Thin Straw: Within functional limits Oral - Solids Oral - Puree: Within functional limits Oral - Mechanical Soft: Other (Comment) (attempted but pt sucked  on cracker)   Pharyngeal Phase Pharyngeal Phase Pharyngeal Phase: Impaired Pharyngeal - Nectar Pharyngeal - Nectar Cup: Delayed swallow initiation;Pharyngeal  residue - valleculae;Pharyngeal residue - pyriform sinuses;Trace  aspiration;Penetration/Aspiration after swallow Penetration/Aspiration details (nectar cup): Material does not  enter airway;Material enters airway, remains ABOVE vocal cords  then ejected out;Material enters airway, passes BELOW cords  without attempt by patient to eject out (silent aspiration) Pharyngeal - Thin Pharyngeal - Thin Cup: Delayed swallow   initiation;Penetration/Aspiration before swallow;Pharyngeal  residue - pyriform sinuses;Moderate aspiration Penetration/Aspiration details (thin cup): Material enters  airway, passes BELOW cords without attempt by patient to eject  out (silent aspiration);Material does not enter airway Pharyngeal - Thin Straw: Delayed swallow  initiation;Penetration/Aspiration before swallow;Pharyngeal  residue - pyriform sinuses;Moderate aspiration Penetration/Aspiration details (thin straw): Material enters  airway, passes BELOW cords without attempt by patient to eject  out (silent aspiration);Material does not enter airway Pharyngeal - Solids Pharyngeal - Puree: Delayed swallow initiation  Cervical Esophageal Phase    GO    Cervical Esophageal Phase Cervical Esophageal Phase: Impaired Cervical Esophageal Phase - Comment Cervical Esophageal Comment: Appearance of osteophyte at C6/7  impacting opening of UES. Mild backflow to pyriforms from UES  post swallow.         Harlon Ditty, [place] CCC-SLP [phone]  Claudine Mouton 08/27/2012, 3:36 PM      Scheduled Meds:   . ALPRAZolam  0.5 mg Oral TID  . amitriptyline  25 mg Oral QHS  . antiseptic oral rinse  15 mL Mouth Rinse QID  . aspirin  325 mg Oral Daily  . carBAMazepine  400 mg Per Tube TID  . celecoxib  100 mg Oral BID  . chlorhexidine  15 mL Mouth Rinse BID  . DULoxetine  30 mg Oral BID  . feeding supplement  1 Container Oral TID BM  . folic acid  1 mg Oral Daily  . gabapentin  100 mg Oral TID  . heparin subcutaneous  5,000 Units Subcutaneous Q8H  . levETIRAcetam  1,000 mg Oral BID  . lisinopril  10 mg Oral Daily  . thiamine  100 mg Per Tube TID   Continuous Infusions:   . sodium chloride 125 mL/hr (08/28/12 1249)      Time spent: 30 minutes    Kenya Kook  [facility] Pager [phone]. If 8PM-8AM, please contact night-coverage at [email or web address], password [facility] 08/28/2012, 5:04 PM  LOS: 8 days

## 2012-08-28 NOTE — Consult Note (Signed)
Physical Medicine and Rehabilitation Consult Reason for Consult: Seizures Referring Physician:  Dr. Sharon Seller.    HPI: Donna [name] is a 56 y.o. female with history of depression, seizures,  recent diagnosis Wernicke-Korsakoff dementia; who was found unresponsive next to empty Oxychodone bottle. Seizure en route and was intubated for airway protection. Drug screen positive for cocaine. Was transferred form [facility] on 08/20/12.  STEMI, seen by Cardiology, not a candidate for emergent cardiac catheterization. Started on IV heparin and ASA. CT head without acute changes. Keppra added to tegretol for status epilepticus in setting of cocaine use. Treated with Unasyn for  Pneumonitis/aspiration PNA. Patient self extubated on 12/14 and has been confused. Dr. Ferol Luz consulted for input/evaluation and felt patient too confused for effective evaluation. PT/ST evaluations done yesterday and patient noted to have right inattention with tone and difficulty using RUE. She demonstrates poor safety with impulsivity, perseveration and needs constant redirection. PT,ST, MD recommending CIR for progression.   Review of Systems  Unable to perform ROS: language   Past Medical History  Diagnosis Date  . Coronary artery disease   . Degenerative disc disease   . Asthma   . Lupus   . Hepatitis C   . Anxiety   . Obsessive-compulsive disorder   . Depression   . PTSD (post-traumatic stress disorder)   . Seizures   . Polysubstance abuse    Past Surgical History  Procedure Date  . Cholecystectomy   . Appendectomy   . Tonsillectomy   . Tubal ligation   . Polyps on vocal cords   . Cardiac catheterization   . Esophagogastroduodenoscopy 06/02/2012    Procedure: ESOPHAGOGASTRODUODENOSCOPY (EGD);  Surgeon: Malissa Hippo, MD;  Location: [facility];  Service: Endoscopy;  Laterality: N/A;  . Wernicke korsakoff syndrome 07/01/2012   Family History  Problem Relation Age of Onset  . Depression Mother   . Alcohol abuse  Father   . ADD / ADHD Sister   . Drug abuse Sister   . Anxiety disorder Sister   . OCD Sister   . ADD / ADHD Brother   . Alcohol abuse Brother   . Drug abuse Brother   . Anxiety disorder Brother   . ADD / ADHD Sister   . Drug abuse Sister   . Anxiety disorder Sister   . Paranoid behavior Sister   . ADD / ADHD Son    Social History:  Lives alone? Per  reports that she quit smoking about 3 months ago. Her smoking use included Cigarettes. She has a 4.4 pack-year smoking history. She quit smokeless tobacco use about 3 months ago. Per reports that she uses illicit drugs. Per  reports that she does not drink alcohol.   Allergies  Allergen Reactions  . Corn-Containing Products Shortness Of Breath and Swelling    REACTION: affects breathing  . Eggs Or Egg-Derived Products Shortness Of Breath  . Latex Shortness Of Breath, Itching and Rash  . Codeine Itching and Nausea And Vomiting  . Penicillins Other (See Comments)    REACTION: "thrush"  . Sulfa Antibiotics Other (See Comments)    REACTION: Unknown   Medications Prior to Admission  Medication Sig Dispense Refill  . ALPRAZolam (XANAX) 1 MG tablet Take 1 tablet (1 mg total) by mouth 3 (three) times daily before meals.  30 tablet  0  . budesonide-formoterol (SYMBICORT) 160-4.5 MCG/ACT inhaler Inhale 2 puffs into the lungs 2 (two) times daily.  1 Inhaler  12  . HYDROcodone-acetaminophen (NORCO/VICODIN) 5-325 MG per  tablet Take 1 tablet by mouth every 6 (six) hours as needed.      Marland Kitchen ipratropium-albuterol (DUONEB) 0.5-2.5 (3) MG/3ML SOLN Take 3 mLs by nebulization 2 (two) times daily.      Marland Kitchen lisinopril (PRINIVIL,ZESTRIL) 20 MG tablet Take 1 tablet (20 mg total) by mouth daily.  30 tablet  0  . metoprolol (LOPRESSOR) 50 MG tablet Take 50 mg by mouth 2 (two) times daily.      . pantoprazole (PROTONIX) 40 MG tablet Take 40 mg by mouth daily.      Marland Kitchen PARoxetine (PAXIL) 40 MG tablet Take 40 mg by mouth every morning.      Marland Kitchen acetaminophen  (TYLENOL) 500 MG tablet Take 500 mg by mouth every 6 (six) hours as needed. pain      . albuterol (PROAIR HFA) 108 (90 BASE) MCG/ACT inhaler Inhale 2 puffs into the lungs every 6 (six) hours as needed. Shortness of breath      . amitriptyline (ELAVIL) 25 MG tablet Take 1 tablet (25 mg total) by mouth at bedtime.  30 tablet  1  . aspirin EC 81 MG tablet Take 1 tablet (81 mg total) by mouth daily.      . carbamazepine (TEGRETOL-XR) 200 MG 12 hr tablet Start with one the first night, may add a second if not asleep in 1 hour. Then 2 the next night and may add the third dose if not asleep in one hour.  Stay on 3 at night. For seizures and brain damage  90 tablet  1  . celecoxib (CELEBREX) 100 MG capsule Take 1 capsule (100 mg total) by mouth 2 (two) times daily.  60 capsule  2  . cetirizine (ZYRTEC) 10 MG tablet Take 10 mg by mouth daily.      . DULoxetine (CYMBALTA) 30 MG capsule Take 1 capsule (30 mg total) by mouth 2 (two) times daily.  60 capsule  1  . folic acid (FOLVITE) 1 MG tablet Take 1 tablet (1 mg total) by mouth daily.  30 tablet  0  . gabapentin (NEURONTIN) 100 MG capsule Take by mouth FOUR times a day start with one for each dose, then increase to two or three for each dose.  It may cause initial drunk feeling. Eventually will be on 900 mg four times a day.  240 capsule  2  . lidocaine (LIDODERM) 5 % Place 4 patches onto the skin every 12 (twelve) hours. Remove & Discard patch within 12 hours or as directed by MD place over pain sites. USE tape on all four sides  10 patch  0  . meloxicam (MOBIC) 7.5 MG tablet Take 1 tablet (7.5 mg total) by mouth 2 (two) times daily.  60 tablet  1  . nystatin (MYCOSTATIN) 100000 UNIT/ML suspension Take 5 mLs by mouth 4 (four) times daily.      Marland Kitchen thiamine 100 MG tablet Take 1 tablet (100 mg total) by mouth daily.  30 tablet  0  . tiotropium (SPIRIVA HANDIHALER) 18 MCG inhalation capsule Place 1 capsule (18 mcg total) into inhaler and inhale daily.  30 capsule   12    Home: Home Living Additional Comments: Pt unable to give Home environment or PLOF  Functional History: Prior Function Vocation: On disability Comments: Pt unable to provide PLOF and no visitors presents Functional Status:  Mobility: Bed Mobility Bed Mobility: Supine to Sit;Sitting - Scoot to Edge of Bed Supine to Sit: 4: Min assist;With rails Sitting - Scoot to [place]  of Bed: 4: Min assist Transfers Transfers: Sit to Stand;Stand to Sit Sit to Stand: 1: +2 Total assist;From bed Sit to Stand: Patient Percentage: 60% Stand to Sit: 1: +2 Total assist;To bed Stand to Sit: Patient Percentage: 60% Ambulation/Gait Ambulation/Gait Assistance: 1: +2 Total assist Ambulation/Gait: Patient Percentage: 60% Ambulation Distance (Feet): 30 Feet Assistive device: 2 person hand held assist Ambulation/Gait Assistance Details: +2 (A) to maintain balance.  Pt with right knee buckling and hyperextension.  Pt very impulsive and unable to follow commands limiting proper sequence.  Pt very impulsive and needs constant cues for redirection. Gait Pattern: Step-to pattern;Decreased stance time - right;Decreased hip/knee flexion - right;Shuffle;Trunk flexed;Narrow base of support Gait velocity: decrease gait speed Stairs: No    ADL: ADL Grooming: Wash/dry hands;Moderate assistance Where Assessed - Grooming: Supported standing Upper Body Dressing: Moderate assistance Where Assessed - Upper Body Dressing: Supported sitting Lower Body Dressing: Moderate assistance Where Assessed - Lower Body Dressing: Supported sit to Pharmacist, hospital: +2 Total assistance Toilet Transfer Method: Sit to Barista: Bedside commode Equipment Used: Gait belt Transfers/Ambulation Related to ADLs: +2total(pt=60%) sit to stand and for ambulation. Pt with hyperextension RLE and consistently attempts to cross midline to the left with RLE when taking steps. Pt with Rt sided inattention ADL Comments:  Pt presents as CVA vs anoxic BI.   Cognition: Cognition Overall Cognitive Status: Impaired Arousal/Alertness: Awake/alert Orientation Level: Oriented to place;Oriented to time;Disoriented to person (Patient states last name incorrectly twice) Attention: Sustained;Focused;Selective Focused Attention: Appears intact Sustained Attention: Appears intact Selective Attention: Impaired Selective Attention Impairment: Verbal basic;Functional basic Memory:  (NT due to verbal deficits) Awareness: Impaired Awareness Impairment: Intellectual impairment;Emergent impairment Problem Solving: Impaired Problem Solving Impairment: Verbal basic;Functional basic Executive Function:  (impaired by lower level deficits) Behaviors: Impulsive;Perseveration Safety/Judgment: Impaired Cognition Overall Cognitive Status: Impaired Area of Impairment: Attention;Following commands;Safety/judgement;Awareness of deficits;Problem solving Arousal/Alertness: Awake/alert Orientation Level: Disoriented to;Place;Time;Situation Behavior During Session: Restless Current Attention Level: Focused Following Commands: Follows one step commands inconsistently Safety/Judgement: Decreased awareness of safety precautions;Impulsive Safety/Judgement - Other Comments: Pt very impulsive and inattention noted to right side Problem Solving: Pt slow to process at times Cognition - Other Comments: Pt called toothbrush an ink pin but able to correctly state purpose of toothbrush.Pt perseverates often  Blood pressure 114/99, pulse 99, temperature 98 F (36.7 C), temperature source Oral, resp. rate 22, height 5' 0.24" (1.53 m), weight 58.968 kg (130 lb), SpO2 96.00%. Physical Exam  Nursing note and vitals reviewed. Constitutional: She appears well-developed and well-nourished.  HENT:  Head: Normocephalic and atraumatic.  Eyes: Pupils are equal, round, and reactive to light.  Neck: Normal range of motion. Neck supple.  Cardiovascular:  Normal rate and regular rhythm.   Pulmonary/Chest: Effort normal and breath sounds normal.  Abdominal: Soft. Bowel sounds are normal.  Musculoskeletal: She exhibits no edema and no tenderness.  Neurological: She is alert.       Oriented to self. Place "[facility]" Able to state DOB but expressive deficits with perseveration noted. Able to follow simple one step commands with tactile cues. RUE weakness noted. Right sided ataxia UE>LE. Difficult to redirect. Moves all 4's spontaneously  Skin: Skin is warm and dry.    Results for orders placed during the hospital encounter of 08/20/12 (from the past 24 hour(s))  GLUCOSE, CAPILLARY     Status: Abnormal   Collection Time   08/27/12 11:33 AM      Component Value Range   Glucose-Capillary 100 (*) 70 -  99 mg/dL   Comment 1 Notify RN     Comment 2 Documented in Chart    CBC     Status: Normal   Collection Time   08/28/12  5:15 AM      Component Value Range   WBC 4.3  4.0 - 10.5 K/uL   RBC 4.68  3.87 - 5.11 MIL/uL   Hemoglobin 14.4  12.0 - 15.0 g/dL   HCT 30.8  65.7 - 84.6 %   MCV 92.5  78.0 - 100.0 fL   MCH 30.8  26.0 - 34.0 pg   MCHC 33.3  30.0 - 36.0 g/dL   RDW 96.2  95.2 - 84.1 %   Platelets 189  150 - 400 K/uL  BASIC METABOLIC PANEL     Status: Abnormal   Collection Time   08/28/12  9:45 AM      Component Value Range   Sodium 141  135 - 145 mEq/L   Potassium 3.7  3.5 - 5.1 mEq/L   Chloride 104  96 - 112 mEq/L   CO2 26  19 - 32 mEq/L   Glucose, Bld 107 (*) 70 - 99 mg/dL   BUN 5 (*) 6 - 23 mg/dL   Creatinine, Ser 3.24  0.50 - 1.10 mg/dL   Calcium 8.4  8.4 - 40.1 mg/dL   GFR calc non Af Amer >90  >90 mL/min   GFR calc Af Amer >90  >90 mL/min     Assessment/Plan: Diagnosis: W-K dementia, admitted with drug OD. 1. Does the need for close, 24 hr/day medical supervision in concert with the patient's rehab needs make it unreasonable for this patient to be served in a less intensive setting? No 2. Co-Morbidities requiring  supervision/potential complications: n/a 3. Due to bladder management, bowel management, safety, skin/wound care, disease management, medication administration, pain management and patient education, does the patient require 24 hr/day rehab nursing? No 4. Does the patient require coordinated care of a physician, rehab nurse, n/a to address physical and functional deficits in the context of the above medical diagnosis(es)? No Addressing deficits in the following areas: balance, locomotion, strength, transferring, bowel/bladder control, bathing, feeding and cognition 5. Can the patient actively participate in an intensive therapy program of at least 3 hrs of therapy per day at least 5 days per week? No 6. The potential for patient to make measurable gains while on inpatient rehab is poor 7. Anticipated functional outcomes upon discharge from inpatient rehab are n/a with PT, n/a with OT, n/a with SLP. 8. Estimated rehab length of stay to reach the above functional goals is: n/a 9. Does the patient have adequate social supports to accommodate these discharge functional goals? Potentially 10. Anticipated D/C setting: Home 11. Anticipated post D/C treatments: other 12. Overall Rehab/Functional Prognosis: poor  RECOMMENDATIONS: This patient's condition is appropriate for continued rehabilitative care in the following setting: SNF? Patient has agreed to participate in recommended program. n/a Note that insurance prior authorization may be required for reimbursement for recommended care.  Comment: Long term care arrangements need to be made.   Ivory Broad, MD 08/28/2012

## 2012-08-28 NOTE — Progress Notes (Signed)
Clinical Social Work Progress Note PSYCHIATRY SERVICE LINE 08/28/2012  Patient:  Donna [name]  Account:  [id number]  Admit Date:  08/20/2012  Clinical Social Worker:  Unk Lightning, LCSW  Date/Time:  08/28/2012 09:00 AM  Review of Patient  Overall Medical Condition:   Patient reports she feels better. Per Designer, television/film set, patient still confused.   Participation Level:  Active  Participation Quality  Other - See comment   Other Participation Quality:   Confused   Affect  Anxious   Cognitive  Confused   Reaction to Medications/Concerns:   None reported   Modes of Intervention  Exploration   Summary of Progress/Plan at Discharge   CSW met with patient at bedside to assess patient. Patient laying in bed and watching tv with sitter present. CSW introduced myself and explained that CSW had met with patient a couple of times before today. Patient was unaware who CSW and reported she did not remember CSW. CSW assessed patient to determine if she would be able to properly participate in assessment. Patient reported she was in [place], [place]. Patient was fixated on the address [address] When CSW questioned patient regarding person, place or time, patient would repeat address. Patient is unable to report why she was hospitalized but reports that she was confused so son brought her to the hospital. Patient is unable to answer any questions regarding if she consumed any substances to cause the confusion.    Patient unable to participate in assessment. CSW will attempt at a later time in order to gain more insight on patient's hospitalization.    CSW spoke with [facility] representative who states they are evaluating patient. Patient faxed out to SNF as alternative option. CSW will continue to follow.

## 2012-08-28 NOTE — Consult Note (Signed)
Patient Identification:  Donna [name] Date of Evaluation:  08/28/2012 Reason for Consult:  Overdose  Referring Provider: Dr. Tyson Alias  History of Present Illness:Pt was found unresponsive with empty bottle of oxyContin.  She had a seizure en route. and had a phase of status epilepticus that was controlled.  She had a Dx Wernicke's encephalopathy on previous admission and was determined to be better on discharge.  She has progressively demonstrated more cognitive function over past few days.  Initially all she would say was and address.  Today she tried to engage in conversation, - ineffectively.   Past Psychiatric History: apparent drug: cocaine abuse, poor adherence to treatment.   Past Medical History:     Past Medical History  Diagnosis Date  . Coronary artery disease   . Degenerative disc disease   . Asthma   . Lupus   . Hepatitis C   . Anxiety   . Obsessive-compulsive disorder   . Depression   . PTSD (post-traumatic stress disorder)   . Seizures   . Polysubstance abuse        Past Surgical History  Procedure Date  . Cholecystectomy   . Appendectomy   . Tonsillectomy   . Tubal ligation   . Polyps on vocal cords   . Cardiac catheterization   . Esophagogastroduodenoscopy 06/02/2012    Procedure: ESOPHAGOGASTRODUODENOSCOPY (EGD);  Surgeon: Malissa Hippo, MD;  [facility];  Service: Endoscopy;  Laterality: N/A;  . 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 Corn-Containing Products Shortness Of Breath and Swelling    REACTION: affects breathing  . Eggs Or Egg-Derived Products Shortness Of Breath  . Latex Shortness Of Breath, Itching and Rash  . Codeine Itching and Nausea And Vomiting  . Penicillins Other (See Comments)    REACTION: "thrush"  . Sulfa Antibiotics Other (See Comments)    REACTION: Unknown    Current Medications:  Prior to Admission medications   Medication Sig Start Date End Date Taking?  Authorizing Provider  ALPRAZolam Prudy Feeler) 1 MG tablet Take 1 tablet (1 mg total) by mouth 3 (three) times daily before meals. 06/04/12  Yes Nimish Normajean Glasgow, MD  budesonide-formoterol (SYMBICORT) 160-4.5 MCG/ACT inhaler Inhale 2 puffs into the lungs 2 (two) times daily. 04/26/12 04/26/13 Yes Zannie Cove, MD  HYDROcodone-acetaminophen (NORCO/VICODIN) 5-325 MG per tablet Take 1 tablet by mouth every 6 (six) hours as needed.   Yes Historical Provider, MD  ipratropium-albuterol (DUONEB) 0.5-2.5 (3) MG/3ML SOLN Take 3 mLs by nebulization 2 (two) times daily.   Yes Historical Provider, MD  lisinopril (PRINIVIL,ZESTRIL) 20 MG tablet Take 1 tablet (20 mg total) by mouth daily. 06/04/12  Yes Nimish Normajean Glasgow, MD  metoprolol (LOPRESSOR) 50 MG tablet Take 50 mg by mouth 2 (two) times daily.   Yes Historical Provider, MD  pantoprazole (PROTONIX) 40 MG tablet Take 40 mg by mouth daily.   Yes Historical Provider, MD  PARoxetine (PAXIL) 40 MG tablet Take 40 mg by mouth every morning.   Yes Historical Provider, MD  acetaminophen (TYLENOL) 500 MG tablet Take 500 mg by mouth every 6 (six) hours as needed. pain    Historical Provider, MD  albuterol (PROAIR HFA) 108 (90 BASE) MCG/ACT inhaler Inhale 2 puffs into the lungs every 6 (six) hours as needed. Shortness of breath    Historical Provider, MD  amitriptyline (ELAVIL) 25 MG tablet Take 1 tablet (25 mg total) by mouth at bedtime. 07/18/12   Dorma Russell  Tawnya Crook, MD  aspirin EC 81 MG tablet Take 1 tablet (81 mg total) by mouth daily. 05/09/12 05/09/13  Prescott Parma, PA  carbamazepine (TEGRETOL-XR) 200 MG 12 hr tablet Start with one the first night, may add a second if not asleep in 1 hour. Then 2 the next night and may add the third dose if not asleep in one hour.  Stay on 3 at night. For seizures and brain damage 07/18/12   Mike Craze, MD  celecoxib (CELEBREX) 100 MG capsule Take 1 capsule (100 mg total) by mouth 2 (two) times daily. 07/18/12 07/18/13  Mike Craze, MD   cetirizine (ZYRTEC) 10 MG tablet Take 10 mg by mouth daily.    Historical Provider, MD  DULoxetine (CYMBALTA) 30 MG capsule Take 1 capsule (30 mg total) by mouth 2 (two) times daily. 07/18/12   Mike Craze, MD  folic acid (FOLVITE) 1 MG tablet Take 1 tablet (1 mg total) by mouth daily. 06/04/12   Nimish Normajean Glasgow, MD  gabapentin (NEURONTIN) 100 MG capsule Take by mouth FOUR times a day start with one for each dose, then increase to two or three for each dose.  It may cause initial drunk feeling. Eventually will be on 900 mg four times a day. 07/18/12   Mike Craze, MD  lidocaine (LIDODERM) 5 % Place 4 patches onto the skin every 12 (twelve) hours. Remove & Discard patch within 12 hours or as directed by MD place over pain sites. USE tape on all four sides 07/18/12 07/18/13  Mike Craze, MD  meloxicam (MOBIC) 7.5 MG tablet Take 1 tablet (7.5 mg total) by mouth 2 (two) times daily. 07/18/12   Mike Craze, MD  nystatin (MYCOSTATIN) 100000 UNIT/ML suspension Take 5 mLs by mouth 4 (four) times daily.    Historical Provider, MD  thiamine 100 MG tablet Take 1 tablet (100 mg total) by mouth daily. 06/04/12   Nimish Normajean Glasgow, MD  tiotropium (SPIRIVA HANDIHALER) 18 MCG inhalation capsule Place 1 capsule (18 mcg total) into inhaler and inhale daily. 04/26/12 04/26/13  Zannie Cove, MD    Social History:    reports that she quit smoking about 3 months ago. Her smoking use included Cigarettes. She has a 4.4 pack-year smoking history. She quit smokeless tobacco use about 3 months ago. She reports that she uses illicit drugs. She reports that she does not drink alcohol.   Family History:    Family History  Problem Relation Age of Onset  . Depression Mother   . Alcohol abuse Father   . ADD / ADHD Sister   . Drug abuse Sister   . Anxiety disorder Sister   . OCD Sister   . ADD / ADHD Brother   . Alcohol abuse Brother   . Drug abuse Brother   . Anxiety disorder Brother   . ADD / ADHD Sister   .  Drug abuse Sister   . Anxiety disorder Sister   . Paranoid behavior Sister   . ADD / ADHD Son     Mental Status Examination/Evaluation: Objective:  Appearance: unkempt, sitting up in bed  Eye Contact::  Good  Speech:  Clear and Coherent and stuters stammers in attempt to speak  Volume:  Decreased  Mood:  confused  Affect:  thought disordered  Thought Process:  Disorganized and Loose  Orientation:  Other:  oriented to self and situation; unable to explain empty pill bottle  Thought Content:  confusion  Suicidal Thoughts:  No  Homicidal Thoughts:  No  Judgement:  Poor  Insight:  Lacking   DIAGNOSIS:   AXIS I  h/o OCD, Anxiety, Depression, Polysubstance Abuse, Alcohol abuse  AXIS II  Deferred  AXIS III See medical notes.  AXIS IV economic problems, other psychosocial or environmental problems, problems related to social environment and overdose, polysubstance abuse,   AXIS V 41-50 serious symptoms   Assessment/Plan: Pt is awake, alert and begins engaging in conversation.  She says she had a lot of pain.  She says her speech is different.  She lives alone; has a Furniture conservator/restorer, Scientific laboratory technician.  She says she walks to get groceries, cooks and cleans  She has no pets.  She would call on neighbor, Morgan Sink She begins MSE  She says the date is 2002, 2003 December.  She spells worild and cannot spell it backwards.  She cannot calculate serial 20s.  She says she was suicial and does not remember.  She says she smokes 1/2 pack per day for 20 years.  She drinks beer, denies drug use and does not use cannabis.   She has not slept well.  The entire verbal exchange is slowed with pt's perseverations, she stammers repeating syllables and or words.  She at times does not make sense.  She repeats some phrases repeatedly. She is observed word - searching.  RECOMMENDATION:  Discussed with Dr. Gonzella Lex, Psych, CSW 1.  Pt does not currently have capacity to explain or state thoughts and is word searching, suggestive of short term  memory loss. 2.  Agree with t Cymbalta 30 mg 2 x daily, Folic acid, thiaamine.  3.  Suggest transfer to SNF. 4.  No further psychiatric needs unless requested.  MD Psyhciatrist signs off.  Tosca Pletz MD 08/28/2012 10:52 AM

## 2012-08-28 NOTE — Progress Notes (Signed)
Physical Therapy Treatment Patient Details Name: Donna [name] MRN: [id number] DOB: 1956-02-21 Today's Date: 08/28/2012 Time: 7846-9629 PT Time Calculation (min): 24 min  PT Assessment / Plan / Recommendation Comments on Treatment Session  Pt is 56 yo female with STEMI and new CVA who continues with right sided weakness and decreased motor control of the right side, but is progressing with ambulation. Her cognitive deficits limit safe mobility. Continue acute PT and recommend CIR     Follow Up Recommendations  CIR     Does the patient have the potential to tolerate intense rehabilitation     Barriers to Discharge        Equipment Recommendations  None recommended by PT    Recommendations for Other Services Rehab consult  Frequency Min 3X/week   Plan Discharge plan remains appropriate;Frequency remains appropriate    Precautions / Restrictions Precautions Precautions: Fall Restrictions Weight Bearing Restrictions: No   Pertinent Vitals/Pain No c/o pain    Mobility  Bed Mobility Bed Mobility: Not assessed (pt sitting on BSC) Transfers Transfers: Sit to Stand;Stand to Sit Sit to Stand: From bed;3: Mod assist;From chair/3-in-1;With upper extremity assist Stand to Sit: 3: Mod assist;To bed;To chair/3-in-1;With upper extremity assist Details for Transfer Assistance: pt initiating transfer well today but right knee continues to be unstable, buckling and hyperextending, so pt's balance is poor as she stands and she loses balance to the right Ambulation/Gait Ambulation/Gait Assistance: 1: +2 Total assist Ambulation/Gait: Patient Percentage: 70% Ambulation Distance (Feet): 100 Feet Assistive device: 2 person hand held assist Ambulation/Gait Assistance Details: pt began ambulation with step to pattern, right leg leading , and only stepping up to the right with the left. With vc's, pt was able to begin a step-through pattern with decreased stride length left. Pt also ambulates  with very narrow BOS, vc's to widen base confused her and she was unable to do this. Trunk flexed, with frequent vc's to correct. Pt was able to correct for short periods of time. Gait Pattern: Step-through pattern;Decreased step length - left;Narrow base of support;Trunk flexed Gait velocity: decrease gait speed Stairs: No Wheelchair Mobility Wheelchair Mobility: No Modified Rankin (Stroke Patients Only) Pre-Morbid Rankin Score:  (unknown) Modified Rankin: Moderately severe disability    Exercises     PT Diagnosis:    PT Problem List:   PT Treatment Interventions:     PT Goals Acute Rehab PT Goals PT Goal Formulation: Patient unable to participate in goal setting Time For Goal Achievement: 09/03/12 Potential to Achieve Goals: Fair Pt will go Supine/Side to Sit: with supervision PT Goal: Supine/Side to Sit - Progress: Progressing toward goal Pt will go Sit to Supine/Side: with supervision PT Goal: Sit to Supine/Side - Progress: Progressing toward goal Pt will go Sit to Stand: with min assist PT Goal: Sit to Stand - Progress: Progressing toward goal Pt will go Stand to Sit: with min assist PT Goal: Stand to Sit - Progress: Progressing toward goal Pt will Transfer Bed to Chair/Chair to Bed: with min assist PT Transfer Goal: Bed to Chair/Chair to Bed - Progress: Progressing toward goal Pt will Stand: with supervision;1 - 2 min PT Goal: Stand - Progress: Progressing toward goal Pt will Ambulate: >150 feet;with min assist;with least restrictive assistive device PT Goal: Ambulate - Progress: Progressing toward goal  Visit Information  Last PT Received On: 08/28/12 Assistance Needed: +2    Subjective Data  Subjective: I can't walk Patient Stated Goal: did not state   Cognition  Overall Cognitive Status:  Impaired Area of Impairment: Attention;Following commands;Safety/judgement;Awareness of deficits;Problem solving Arousal/Alertness: Awake/alert Orientation Level: Disoriented  to;Place;Time;Situation Behavior During Session: Restless Current Attention Level: Focused Following Commands: Follows one step commands inconsistently Safety/Judgement: Decreased awareness of safety precautions;Impulsive Awareness of Deficits: pt knows something is wrong but is frustrated by decreased insight into problems Problem Solving: slow to process and comprehend Cognition - Other Comments: pt had difficulty problem solving soap dispenser and is impulsive. Does not have an memory of working with therapy yesterday.     Balance  Balance Balance Assessed: Yes Static Standing Balance Static Standing - Balance Support: During functional activity;Right upper extremity supported Static Standing - Level of Assistance: 3: Mod assist;4: Min assist Static Standing - Comment/# of Minutes: pt showed better attention to right side when standing and performing tasks today. She has decreased voluntary control of the RUE and when trying to lean on the right hand, the elbow suddenly flexes. No sudden knee flexion today in standing  End of Session PT - End of Session Equipment Utilized During Treatment: Gait belt Activity Tolerance: Patient tolerated treatment well Patient left: in chair;with call bell/phone within reach;with family/visitor present Nurse Communication: Mobility status   GP   Lyanne Co, PT  Acute Rehab Services  [phone]   Lyanne Co 08/28/2012, 1:24 PM

## 2012-08-29 DIAGNOSIS — F1910 Other psychoactive substance abuse, uncomplicated: Secondary | ICD-10-CM

## 2012-08-29 DIAGNOSIS — G40401 Other generalized epilepsy and epileptic syndromes, not intractable, with status epilepticus: Secondary | ICD-10-CM

## 2012-08-29 LAB — CBC
HCT: 42.4 % (ref 36.0–46.0)
Hemoglobin: 13.8 g/dL (ref 12.0–15.0)
MCH: 30.3 pg (ref 26.0–34.0)
MCHC: 32.5 g/dL (ref 30.0–36.0)
RBC: 4.56 MIL/uL (ref 3.87–5.11)

## 2012-08-29 LAB — BASIC METABOLIC PANEL
BUN: 8 mg/dL (ref 6–23)
CO2: 26 mEq/L (ref 19–32)
Calcium: 8.3 mg/dL — ABNORMAL LOW (ref 8.4–10.5)
GFR calc non Af Amer: >90 mL/min (ref >90)
Glucose, Bld: 93 mg/dL (ref 70–99)

## 2012-08-29 MED ORDER — ONDANSETRON HCL 4 MG/2ML IJ SOLN
4.0000 mg | Freq: Four times a day (QID) | INTRAMUSCULAR | Status: DC | PRN
  Administered 2012-08-29 – 2012-09-08 (×3): 4 mg via INTRAVENOUS
  Filled 2012-08-29 (×4): qty 2

## 2012-08-29 MED ORDER — POTASSIUM CHLORIDE CRYS ER 20 MEQ PO TBCR
40.0000 meq | EXTENDED_RELEASE_TABLET | Freq: Once | ORAL | Status: AC
  Administered 2012-08-29: 40 meq via ORAL
  Filled 2012-08-29: qty 2

## 2012-08-29 NOTE — Progress Notes (Signed)
Clinical Social Work  Psych MD signed off on patient and recommends SNF. CSW called [facility] regarding [facility] 2 [facility]. [facility] reports information being reviewed and [facility] will contact CSW when representative is coming to evaluate patient. CSW called son, Trinna Post, in order to give bed offers. [facility] is only offer at this time. Son agreeable to this placement. CSW spoke with son regarding applying for Medicaid to assist with SNF payment. CSW will continue to follow.  [place], [place] [phone]

## 2012-08-29 NOTE — Progress Notes (Signed)
Rehab admissions - Evaluated for possible admission.  Please see rehab consult recommending ?SNF placement for patient and need to address long term care needs.  Not a candidate for inpatient rehab at this time.  Call me for questions.  #[phone]

## 2012-08-29 NOTE — Progress Notes (Signed)
[facility] PROGRESS NOTE  Donna [name]:[id number] DOB: [birth year] DOA: 08/20/2012 PCP: Louie Boston, MD   Brief narrative: 56 year old female patient was found unresponsive by her neighbors. O2 sat was 76% in route to the hospital despite EMS placing patient on CPAP. She has a history of polysubstance abuse including cocaine and noncompliance. Upon arrival to the emergency department at [facility], the patient was quite hypoxic and began having a seizure and was immediately intubated by the emergency room physician. Subsequent EKG revealed anterior ST segment elevation. Cardiologist on call reviewed the EKG. Because of patient's history of prior normal cardiac catheterization in 2011 and other explanatory factors for patient's abnormal EKG and altered mental status, severe hypoxia and seizure and fevers it was determined code STEMI would not be activated. Because of need for ICU admission and patient subsequently developed recurrent seizures consistent with status epilepticus pulmonary critical care medicine except that the patient in transfer at [facility]. In the interim she was started on a Versed drip. Because of the EKG changes she was also started on IV heparin. She was also given Ativan to help suppress her seizures and was loaded with Keppra.  Additional history was obtained after admission confirming that patient had been found unresponsive with an empty oxycodone bottle next to her. Her urine drug screen was positive for cocaine after arrival. There were concerns that patient may have aspiration pneumonitis so she was briefly given antibiotics. CT of the head was negative despite no dictation of right-sided weakness which apparently is chronic. Patient eventually self extubated on 08/24/2012. She was subsequently transferred out of the ICU on 08/[facility]/2012 [facility]   Assessment/Plan:  Principle problem:  STEMI (ST elevation myocardial infarction) due to acute cocaine use  -not  felt a candidate for acute cardiac catheterization . -previous catheterization June 2011 revealed no coronary artery disease  -Troponin peaked at 46 and has trended downward- initial EKG had ST elevation in anterior leads. - likely MI precipitated by concomitant use of cocaine with associated overdose and hypoxemia  -patient has encephalopathy with significant word finding difficulty and cardiology recommend for non invasive treatment for now.  -Because of cocaine use cannot use  beta blocker. Added lisinopril.  -Echocardiogram this admission shows no new regional wall motion abnormalities and demonstrates persistent apical akinesis .  Active Problems:  Acute encephalopathy *Acute encephalopathy likely from the above-stated metabolic issues  Patent was admitted at [facility] in September with diagnosis of wernicke's encephalopathy. An MRI obtained at that time was suggestive Posterior reversible encephalopathy syndrome( PRES). apparently she got better on discharge.  -repeat MRI showed  Improved gray-white matter signal in the brain compared to previous study. . Residual signal abnormality in the left thalamus suggests possible seizure vs ischemia.  -Continue with AED for now. Will request for neurology consult. -appreciate psych recs. D/C sitter.   Acute respiratory failure with hypoxia/ Aspiration pneumonitis  *Multifactorial secondary to narcotic overdose with resultant hypoxemia and altered mentation requiring intubation  *Likely also has some aspiration pneumonitis . patient has completed a course of Unasyn  *Continue supportive care.    AKI  Likely prerenal with hypoperfusion. No resolved   Thrombocytopenia  Resolved   Status epilepticus  Possibly substance induced seizure. No seizure activity since 48 hrs post admission  Cont keppra   Hep /Cirrhosis with alcoholism  *Cirrhotic changes seen on previous imaging but total bilirubin normal   Dysphagia  On dys level 3 diet   Hypokalemia   *Resolved   COPD (  chronic obstructive pulmonary disease)  *Stable . Has scattered rhonchi. Cont prn nebs   Hypernatremia  *Resolved   Chronic diastolic congestive heart failure, NYHA class 1  stable   DVT prophylaxis   consults PCCM  cardiology  psych  Neurology to see  Antibiotics:  Levaquin 12/10 x 1  Vancomycin 12/10 x 1  Unasyn 12/10 >>> 12/14       HPI/Subjective: No overnight issues. Still confused with word finding difficulty.  Objective: Filed Vitals:   08/28/12 1009 08/28/12 1425 08/28/12 2038 08/29/12 0506  BP: 114/99 123/79 138/82 148/96  Pulse: 99 95 107 110  Temp: 98 F (36.7 C) 98.1 F (36.7 C) 98.5 F (36.9 C) 97.9 F (36.6 C)  TempSrc: Oral Oral Oral Oral  Resp: 22 20 20 18   Height:   5' 0.02" (1.525 m)   Weight:   59.467 kg (131 lb 1.6 oz)   SpO2: 96% 96% 95% 96%   No intake or output data in the 24 hours ending 08/29/12 1251 Filed Weights   08/27/12 1610 08/27/12 2102 08/28/12 2038  Weight: 58.968 kg (130 lb) 58.968 kg (130 lb) 59.467 kg (131 lb 1.6 oz)    Exam:  General: middle aged female In NAD. Appears dishelved  HEENT: no pallor, moist oral mucosa  Cardiovascular: NS1 & S2, no murmurs  Respiratory: scattered rhonchi b/l  Abdomen: soft, NT, ND, BS+  Ext: warm no edema  CNS: alert, oriented to place and self, has significant word finding difficulty MMSE done by psych  and appears diminished   Data Reviewed: Basic Metabolic Panel:  Lab 08/29/12 9604 08/28/12 0945 08/27/12 0425 08/26/12 2027 08/26/12 0444 08/24/12 0445 08/23/12 0330  NA 141 141 141 135 135 -- --  K 3.4* 3.7 3.5 3.2* 2.9* -- --  CL 107 104 104 100 99 -- --  CO2 26 26 27 25 25  -- --  GLUCOSE 93 107* 113* 100* 96 -- --  BUN 8 5* <3* <3* <3* -- --  CREATININE 0.58 0.52 0.52 0.50 0.49* -- --  CALCIUM 8.3* 8.4 8.9 8.7 9.1 -- --  MG -- -- -- -- -- 1.6 1.9  PHOS -- -- -- -- -- 2.4 2.5   Liver Function Tests:  Lab 08/24/12 0445  AST 66*  ALT 112*  ALKPHOS  78  BILITOT 0.2*  PROT 5.1*  ALBUMIN 2.5*   No results found for this basename: LIPASE:5,AMYLASE:5 in the last 168 hours No results found for this basename: AMMONIA:5 in the last 168 hours CBC:  Lab 08/29/12 0545 08/28/12 0515 08/27/12 0425 08/26/12 0444 08/25/12 0445  WBC 4.8 4.3 5.2 6.1 6.8  NEUTROABS -- -- -- -- --  HGB 13.8 14.4 14.4 15.5* 14.5  HCT 42.4 43.3 43.0 45.0 43.7  MCV 93.0 92.5 90.9 90.0 93.6  PLT 234 189 169 147* 148*   Cardiac Enzymes: No results found for this basename: CKTOTAL:5,CKMB:5,CKMBINDEX:5,TROPONINI:5 in the last 168 hours BNP (last 3 results)  Basename 04/23/12 0327 04/18/12 1924  PROBNP 19352.0* 16764.0*   CBG:  Lab 08/27/12 1133 08/27/12 0743 08/27/12 0320 08/26/12 2329 08/26/12 1945  GLUCAP 100* 110* 93 97 82    Recent Results (from the past 240 hour(s))  CULTURE, BLOOD (ROUTINE X 2)     Status: Normal   Collection Time   08/20/12  1:59 PM      Component Value Range Status Comment   Specimen Description BLOOD RIGHT HAND   Final    Special Requests BOTTLES DRAWN AEROBIC AND  ANAEROBIC 4CC   Final    Culture NO GROWTH 5 DAYS   Final    Report Status 08/25/2012 FINAL   Final   URINE CULTURE     Status: Normal   Collection Time   08/20/12  2:03 PM      Component Value Range Status Comment   Specimen Description URINE, CLEAN CATCH   Final    Special Requests NONE   Final    Culture  Setup Time 08/20/2012 16:50   Final    Colony Count 9,000 COLONIES/ML   Final    Culture INSIGNIFICANT GROWTH   Final    Report Status 08/21/2012 FINAL   Final   CULTURE, BLOOD (ROUTINE X 2)     Status: Normal   Collection Time   08/20/12  2:23 PM      Component Value Range Status Comment   Specimen Description BLOOD LEFT ANTECUBITAL   Final    Special Requests BOTTLES DRAWN AEROBIC ONLY 4CC   Final    Culture NO GROWTH 5 DAYS   Final    Report Status 08/25/2012 FINAL   Final   MRSA PCR SCREENING     Status: Normal   Collection Time   08/20/12  7:22 PM       Component Value Range Status Comment   MRSA by PCR NEGATIVE  NEGATIVE Final      Studies: Mr Brain Wo Contrast  08/29/2012  *RADIOLOGY REPORT*  Clinical Data: 56 year old female found unresponsive on 08/20/2012, possible drug overdose. Seizure, controlled status epilepticus at time of admission.  Abnormal MRI in September.  MRI HEAD WITHOUT CONTRAST  Technique:  Multiplanar, multiecho pulse sequences of the brain and surrounding structures were obtained according to standard protocol without intravenous contrast.  Comparison: Head CTs 08/23/2012 and earlier.  Brain MRI 05/27/2012.  Findings: Diffusion signal appears to be abnormal in the left cingulate gyrus, no definite involvement on the right. More subtle abnormal asymmetric diffusion signal in the left superior frontal gyrus (series 3 image 20).  There is no associated T2 or FLAIR hyperintensity in these areas.  There is mildly abnormal diffusion signal in the medial left thalamus.  No other areas of restricted diffusion.  No convincing major vascular territory infarct.  Improved gray and white matter signal in the brain since September. Most of the abnormal signal in the periphery of the brain has resolved. No cortical encephalomalacia has developed.  There is some residual T2 and FLAIR hyperintensity in the medial left thalamus corresponding to the diffusion finding today.  Signal in the right thalamus has normalized.  Brainstem and cerebellum are within normal limits.  Major intracranial vascular flow voids are preserved.  No ventriculomegaly. No midline shift, mass effect, or evidence of mass lesion.  No acute intracranial hemorrhage identified. Negative pituitary, cervicomedullary junction and visualized cervical spine.  Normal bone marrow signal.  Visualized orbit soft tissues are within normal limits.  Visualized paranasal sinuses and mastoids are clear.  Negative scalp soft tissues.  IMPRESSION: 1.  Improved gray-white matter signal in the  brain compared to the abnormal September study.  Residual signal abnormality in the left thalamus. 2.  New low level abnormal diffusion signal in the left cingulate gyrus and some cortex at the anterior left superior frontal gyrus. 3.  The findings are nonspecific and the new signal abnormality might relate to recent seizure activity or mild ischemia.   A viral or atypical encephalitis is felt less likely given the fairly unilateral involvement.  Original Report Authenticated By: Erskine Speed, M.D.    Dg Swallowing Func-speech Pathology  08/27/2012  [name] De[place]ois, CCC-SLP     08/27/2012  3:37 PM Objective Swallowing Evaluation: Modified Barium Swallowing Study   Patient Details  Name: NATALIYA GRAIG MRN: [id number] Date of Birth: 27-Sep-1955  Today's Date: 08/27/2012 Time: 1303-1330 SLP Time Calculation (min): 27 min  Past Medical History:  Past Medical History  Diagnosis Date  . Coronary artery disease   . Degenerative disc disease   . Asthma   . Lupus   . Hepatitis C   . Anxiety   . Obsessive-compulsive disorder   . Depression   . PTSD (post-traumatic stress disorder)   . Seizures   . Polysubstance abuse    Past Surgical History:  Past Surgical History  Procedure Date  . Cholecystectomy   . Appendectomy   . Tonsillectomy   . Tubal ligation   . Polyps on vocal cords   . Cardiac catheterization   . Esophagogastroduodenoscopy 06/02/2012    Procedure: ESOPHAGOGASTRODUODENOSCOPY (EGD);  Surgeon: Malissa Hippo, MD;  Location: [facility];  Service: Endoscopy;   Laterality: N/A;  . 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   Seizure en route.  Intubated for airway protection.  Drug screen  positive for cocaine.  STEMI, seen by Cardiology, not a candidate  for emergent cardiac catheterization.  Heparin completed.  ASA  continued.  Beta blockers / statins contraindicated.  Antibiotics  d/c'd as more likely pneumonitis than pneumonia.  Self  extubated  12/14, doing well.treat agitation, benzo WD?  PT OT reports  concern for right sided weakness prior to SLp swallow eval. MD  agreed to cognitive linguistic eval.      Assessment / Plan / Recommendation Clinical Impression  Clinical impression: Pt presents with moderate sensory based  oropharyngeal deficits leading to delayed initiation of swallow  response and silent aspiration. Oral function is likely at  baseline though pt struggled to consume solids due to cognitive  impairment and poor dentition. Pharyngeal swallow characterized  by delay in swallow initation to the pyriform sinuses with silent  aspiration of thin liquids and in one instance nectar thick  liquids. The pts intake is impulsive. With small controlled sips  the pt may tolerate thin liquids but at this time would be at  high risk of aspiration due to cognitive deficits and inability  to follow precautions/strategies. Recommend a dys 3 (mechanical  soft) diet with nectar thick liquids.     Treatment Recommendation  Therapy as outlined in treatment plan below    Diet Recommendation Dysphagia 3 (Mechanical Soft);Nectar-thick  liquid   Liquid Administration via: Cup;No straw Medication Administration: Whole meds with puree Supervision: Patient able to self feed;Full supervision/cueing  for compensatory strategies Compensations: Slow rate;Small sips/bites;Clear throat  intermittently Postural Changes and/or Swallow Maneuvers: Seated upright 90  degrees;Upright 30-60 min after meal    Other  Recommendations Oral Care Recommendations: Oral care BID Other Recommendations: Order thickener from pharmacy   Follow Up Recommendations  Inpatient Rehab    Frequency and Duration min 2x/week  2 weeks   Pertinent Vitals/Pain NA    SLP Swallow Goals Patient will consume recommended diet without observed clinical  signs of aspiration with: Minimal assistance Patient will utilize recommended strategies during swallow to  increase swallowing safety with:  Minimal cueing   General HPI: 56 yo found unresponsive next to empty Oxychodone  bottle.  Seizure en route.  Intubated for airway protection.   Drug screen positive for cocaine.  STEMI, seen by Cardiology, not  a candidate for emergent cardiac catheterization.  Heparin  completed.  ASA continued.  Beta blockers / statins  contraindicated.  Antibiotics d/c'd as more likely pneumonitis  than pneumonia.  Self extubated 12/14, doing well.treat  agitation, benzo WD?  PT OT reports concern for right sided  weakness prior to SLp swallow eval. MD agreed to cognitive  linguistic eval.  Type of Study: Modified Barium Swallowing Study Reason for Referral: Objectively evaluate swallowing function Diet Prior to this Study: NPO Respiratory Status: Supplemental O2 delivered via (comment) History of Recent Intubation: Yes Length of Intubations (days): 2 days Date extubated: 08/24/12 Behavior/Cognition: Alert;Cooperative;Confused;Pleasant mood Oral Cavity - Dentition: Missing dentition Oral Motor / Sensory Function: Within functional limits Self-Feeding Abilities: Able to feed self Patient Positioning: Upright in bed Baseline Vocal Quality: Clear Volitional Cough: Weak Volitional Swallow: Able to elicit Anatomy: Within functional limits Pharyngeal Secretions: Not observed secondary MBS    Reason for Referral Objectively evaluate swallowing function   Oral Phase Oral Preparation/Oral Phase Oral Phase: Impaired Oral - Nectar Oral - Nectar Cup: Within functional limits Oral - Thin Oral - Thin Cup: Within functional limits Oral - Thin Straw: Within functional limits Oral - Solids Oral - Puree: Within functional limits Oral - Mechanical Soft: Other (Comment) (attempted but pt sucked  on cracker)   Pharyngeal Phase Pharyngeal Phase Pharyngeal Phase: Impaired Pharyngeal - Nectar Pharyngeal - Nectar Cup: Delayed swallow initiation;Pharyngeal  residue - valleculae;Pharyngeal residue - pyriform sinuses;Trace  aspiration;Penetration/Aspiration  after swallow Penetration/Aspiration details (nectar cup): Material does not  enter airway;Material enters airway, remains ABOVE vocal cords  then ejected out;Material enters airway, passes BELOW cords  without attempt by patient to eject out (silent aspiration) Pharyngeal - Thin Pharyngeal - Thin Cup: Delayed swallow  initiation;Penetration/Aspiration before swallow;Pharyngeal  residue - pyriform sinuses;Moderate aspiration Penetration/Aspiration details (thin cup): Material enters  airway, passes BELOW cords without attempt by patient to eject  out (silent aspiration);Material does not enter airway Pharyngeal - Thin Straw: Delayed swallow  initiation;Penetration/Aspiration before swallow;Pharyngeal  residue - pyriform sinuses;Moderate aspiration Penetration/Aspiration details (thin straw): Material enters  airway, passes BELOW cords without attempt by patient to eject  out (silent aspiration);Material does not enter airway Pharyngeal - Solids Pharyngeal - Puree: Delayed swallow initiation  Cervical Esophageal Phase    GO    Cervical Esophageal Phase Cervical Esophageal Phase: Impaired Cervical Esophageal Phase - Comment Cervical Esophageal Comment: Appearance of osteophyte at C6/7  impacting opening of UES. Mild backflow to pyriforms from UES  post swallow.         Harlon Ditty, [place] CCC-SLP [phone]  Claudine Mouton 08/27/2012, 3:36 PM      Scheduled Meds:   . ALPRAZolam  0.5 mg Oral TID  . amitriptyline  25 mg Oral QHS  . antiseptic oral rinse  15 mL Mouth Rinse QID  . aspirin  325 mg Oral Daily  . carBAMazepine  400 mg Per Tube TID  . celecoxib  100 mg Oral BID  . chlorhexidine  15 mL Mouth Rinse BID  . DULoxetine  30 mg Oral BID  . feeding supplement  1 Container Oral TID BM  . folic acid  1 mg Oral Daily  . gabapentin  100 mg Oral TID  . heparin subcutaneous  5,000 Units Subcutaneous Q8H  . levETIRAcetam  1,000 mg Oral BID  . lisinopril  10 mg  Oral Daily  . thiamine  100 mg Per  Tube TID   Continuous Infusions:   . sodium chloride 75 mL/hr (08/29/12 0830)      Time spent: 25 minutes    Zadin Lange  [facility] Pager [phone]. If 8PM-8AM, please contact night-coverage at [email or web address], password [facility] 08/29/2012, 12:51 PM  LOS: 9 days

## 2012-08-29 NOTE — Progress Notes (Signed)
Reason for Consult:Wernicke's Referring Physician: Dhungel  CC: Confusion  HPI: NAIOMI [name] is an 56 y.o. female initially presenting after being found unresponsive by neighbors.  Patient was hypoxic but eventually developed status epilepticus and required intubation.  Seizures were controlled with Keppra and the patient was eventually extubated.  Since that time has had some confusion.  Patient was admitted earlier in the year at [facility] with seizures.  Was diagnosed with PRES and eventually with Wernicke's as well.  Initially had confabulation and hallucinations that seemed to abate.  Has continued with some confusion.  Donna [name] also had a stroke in the past with a residual right hemiparesis.    Past Medical History  Diagnosis Date  . Coronary artery disease   . Degenerative disc disease   . Asthma   . Lupus   . Hepatitis C   . Anxiety   . Obsessive-compulsive disorder   . Depression   . PTSD (post-traumatic stress disorder)   . Seizures   . Polysubstance abuse     Past Surgical History  Procedure Date  . Cholecystectomy   . Appendectomy   . Tonsillectomy   . Tubal ligation   . Polyps on vocal cords   . Cardiac catheterization   . Esophagogastroduodenoscopy 06/02/2012    Procedure: ESOPHAGOGASTRODUODENOSCOPY (EGD);  Surgeon: Malissa Hippo, MD;  Location: [facility];  Service: Endoscopy;  Laterality: N/A;  . 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 Depression Mother   . Alcohol abuse Father   . ADD / ADHD Sister   . Drug abuse Sister   . Anxiety disorder Sister   . OCD Sister   . ADD / ADHD Brother   . Alcohol abuse Brother   . Drug abuse Brother   . Anxiety disorder Brother   . ADD / ADHD Sister   . Drug abuse Sister   . Anxiety disorder Sister   . Paranoid behavior Sister   . ADD / ADHD Son     Social History:  reports that she quit smoking about 3 months ago. Her smoking use included Cigarettes. She has a 4.4  pack-year smoking history. She quit smokeless tobacco use about 3 months ago. She reports that she uses illicit drugs. She reports that she does not drink alcohol.  Allergies  Allergen Reactions  . Corn-Containing Products Shortness Of Breath and Swelling    REACTION: affects breathing  . Eggs Or Egg-Derived Products Shortness Of Breath  . Latex Shortness Of Breath, Itching and Rash  . Codeine Itching and Nausea And Vomiting  . Penicillins Other (See Comments)    REACTION: "thrush"  . Sulfa Antibiotics Other (See Comments)    REACTION: Unknown    Medications:  I have reviewed the patient's current medications. Scheduled:   . ALPRAZolam  0.5 mg Oral TID  . amitriptyline  25 mg Oral QHS  . antiseptic oral rinse  15 mL Mouth Rinse QID  . aspirin  325 mg Oral Daily  . carBAMazepine  400 mg Per Tube TID  . celecoxib  100 mg Oral BID  . chlorhexidine  15 mL Mouth Rinse BID  . DULoxetine  30 mg Oral BID  . feeding supplement  1 Container Oral TID BM  . folic acid  1 mg Oral Daily  . gabapentin  100 mg Oral TID  . heparin subcutaneous  5,000 Units Subcutaneous Q8H  . levETIRAcetam  1,000 mg Oral BID  . lisinopril  10 mg Oral Daily  . thiamine  100 mg Per Tube TID    ROS: History obtained from the patient  General ROS: negative for - chills, fatigue, fever, night sweats, weight gain or weight loss Psychological ROS: negative for - behavioral disorder, hallucinations, memory difficulties, mood swings or suicidal ideation Ophthalmic ROS: negative for - blurry vision, double vision, eye pain or loss of vision ENT ROS: negative for - epistaxis, nasal discharge, oral lesions, sore throat, tinnitus or vertigo Allergy and Immunology ROS: negative for - hives or itchy/watery eyes Hematological and Lymphatic ROS: negative for - bleeding problems, bruising or swollen lymph nodes Endocrine ROS: negative for - galactorrhea, hair pattern changes, polydipsia/polyuria or temperature  intolerance Respiratory ROS: negative for - cough, hemoptysis, shortness of breath or wheezing Cardiovascular ROS: negative for - chest pain, dyspnea on exertion, edema or irregular heartbeat Gastrointestinal ROS: negative for - abdominal pain, diarrhea, hematemesis, nausea/vomiting or stool incontinence Genito-Urinary ROS: negative for - dysuria, hematuria, incontinence or urinary frequency/urgency Musculoskeletal ROS: negative for - joint swelling or muscular weakness Neurological ROS: as noted in HPI Dermatological ROS: negative for rash and skin lesion changes  Physical Examination: Blood pressure 152/92, pulse 100, temperature 98.1 F (36.7 C), temperature source Oral, resp. rate 20, height 5' 0.02" (1.525 m), weight 59.467 kg (131 lb 1.6 oz), SpO2 97.00%.  Neurologic Examination Mental Status: Alert, Knows her first and last name but can not tell me her middle name.  Does not know where she is .  Reports that it is 94 and that she is 57 years old.  Speech fluent without evidence of aphasia.  Follows some simple commands but at times requires reinforcement-will think she is doing something that she really is not doing.  Unable to perform 3-step commands. Cranial Nerves: II: Discs flat bilaterally; Visual fields grossly normal, pupils equal, round, reactive to light and accommodation III,IV, VI: ptosis not present, extra-ocular motions intact bilaterally V,VII: smile symmetric, facial light touch sensation decreased on the right side of the face VIII: hearing normal bilaterally IX,X: gag reflex present XI: bilateral shoulder shrug XII: midline tongue extension Motor: Right : Upper extremity   4/5    Left:     Upper extremity   5/5  Lower extremity   4-/5     Lower extremity   5/5 Tone increased on the right Sensory: Pinprick and light touch decreased on the right Deep Tendon Reflexes: 3+ and symmetric throughout with absent AJ's bilaterally Plantars: Right: mute   Left:  mute Cerebellar: finger-to-nose and heel-to-shin testing unable to be performed Gait: unable to be tested CV: pulses palpable throughout   Laboratory Studies:   Basic Metabolic Panel:  Lab 08/29/12 1610 08/28/12 0945 08/27/12 0425 08/26/12 2027 08/26/12 0444 08/24/12 0445 08/23/12 0330  NA 141 141 141 135 135 -- --  K 3.4* 3.7 3.5 3.2* 2.9* -- --  CL 107 104 104 100 99 -- --  CO2 26 26 27 25 25  -- --  GLUCOSE 93 107* 113* 100* 96 -- --  BUN 8 5* <3* <3* <3* -- --  CREATININE 0.58 0.52 0.52 0.50 0.49* -- --  CALCIUM 8.3* 8.4 8.9 -- -- -- --  MG -- -- -- -- -- 1.6 1.9  PHOS -- -- -- -- -- 2.4 2.5    Liver Function Tests:  Lab 08/24/12 0445  AST 66*  ALT 112*  ALKPHOS 78  BILITOT 0.2*  PROT 5.1*  ALBUMIN 2.5*   No results found for this  basename: LIPASE:5,AMYLASE:5 in the last 168 hours No results found for this basename: AMMONIA:3 in the last 168 hours  CBC:  Lab 08/29/12 0545 08/28/12 0515 08/27/12 0425 08/26/12 0444 08/25/12 0445  WBC 4.8 4.3 5.2 6.1 6.8  NEUTROABS -- -- -- -- --  HGB 13.8 14.4 14.4 15.5* 14.5  HCT 42.4 43.3 43.0 45.0 43.7  MCV 93.0 92.5 90.9 90.0 93.6  PLT 234 189 169 147* 148*    Cardiac Enzymes: No results found for this basename: CKTOTAL:5,CKMB:5,CKMBINDEX:5,TROPONINI:5 in the last 168 hours  BNP: No components found with this basename: POCBNP:5  CBG:  Lab 08/27/12 1133 08/27/12 0743 08/27/12 0320 08/26/12 2329 08/26/12 1945  GLUCAP 100* 110* 93 97 82    Microbiology: Results for orders placed during the hospital encounter of 08/20/12  CULTURE, BLOOD (ROUTINE X 2)     Status: Normal   Collection Time   08/20/12  1:59 PM      Component Value Range Status Comment   Specimen Description BLOOD RIGHT HAND   Final    Special Requests BOTTLES DRAWN AEROBIC AND ANAEROBIC 4CC   Final    Culture NO GROWTH 5 DAYS   Final    Report Status 08/25/2012 FINAL   Final   URINE CULTURE     Status: Normal   Collection Time   08/20/12  2:03 PM       Component Value Range Status Comment   Specimen Description URINE, CLEAN CATCH   Final    Special Requests NONE   Final    Culture  Setup Time 08/20/2012 16:50   Final    Colony Count 9,000 COLONIES/ML   Final    Culture INSIGNIFICANT GROWTH   Final    Report Status 08/21/2012 FINAL   Final   CULTURE, BLOOD (ROUTINE X 2)     Status: Normal   Collection Time   08/20/12  2:23 PM      Component Value Range Status Comment   Specimen Description BLOOD LEFT ANTECUBITAL   Final    Special Requests BOTTLES DRAWN AEROBIC ONLY 4CC   Final    Culture NO GROWTH 5 DAYS   Final    Report Status 08/25/2012 FINAL   Final   MRSA PCR SCREENING     Status: Normal   Collection Time   08/20/12  7:22 PM      Component Value Range Status Comment   MRSA by PCR NEGATIVE  NEGATIVE Final     Coagulation Studies: No results found for this basename: LABPROT:5,INR:5 in the last 72 hours  Urinalysis: No results found for this basename: COLORURINE:2,APPERANCEUR:2,LABSPEC:2,PHURINE:2,GLUCOSEU:2,HGBUR:2,BILIRUBINUR:2,KETONESUR:2,PROTEINUR:2,UROBILINOGEN:2,NITRITE:2,LEUKOCYTESUR:2 in the last 168 hours  Lipid Panel:     Component Value Date/Time   CHOL  Value: 152        ATP III CLASSIFICATION:  <200     mg/dL   Desirable  161-096  mg/dL   Borderline High  >=045    mg/dL   High        12/18/8117 0340   TRIG 62 02/22/2010 0340   HDL 71 02/22/2010 0340   CHOLHDL 2.1 02/22/2010 0340   VLDL 12 02/22/2010 0340   LDLCALC  Value: 69        Total Cholesterol/HDL:CHD Risk Coronary Heart Disease Risk Table                     Men   Women  1/2 Average Risk   3.4   3.3  Average Risk  5.0   4.4  2 X Average Risk   9.6   7.1  3 X Average Risk  23.4   11.0        Use the calculated Patient Ratio above and the CHD Risk Table to determine the patient's CHD Risk.        ATP III CLASSIFICATION (LDL):  <100     mg/dL   Optimal  119-147  mg/dL   Near or Above                    Optimal  130-159  mg/dL   Borderline  829-562  mg/dL    High  >130     mg/dL   Very High 8/65/7846 9629    HgbA1C:  No results found for this basename: HGBA1C    Urine Drug Screen:     Component Value Date/Time   LABOPIA NONE DETECTED 08/20/2012 1403   COCAINSCRNUR POSITIVE* 08/20/2012 1403   LABBENZ POSITIVE* 08/20/2012 1403   AMPHETMU NONE DETECTED 08/20/2012 1403   THCU NONE DETECTED 08/20/2012 1403   LABBARB NONE DETECTED 08/20/2012 1403    Alcohol Level: No results found for this basename: ETH:2 in the last 168 hours  Imaging: Mr Brain Wo Contrast  08/29/2012  *RADIOLOGY REPORT*  Clinical Data: 56 year old female found unresponsive on 08/20/2012, possible drug overdose. Seizure, controlled status epilepticus at time of admission.  Abnormal MRI in September.  MRI HEAD WITHOUT CONTRAST  Technique:  Multiplanar, multiecho pulse sequences of the brain and surrounding structures were obtained according to standard protocol without intravenous contrast.  Comparison: Head CTs 08/23/2012 and earlier.  Brain MRI 05/27/2012.  Findings: Diffusion signal appears to be abnormal in the left cingulate gyrus, no definite involvement on the right. More subtle abnormal asymmetric diffusion signal in the left superior frontal gyrus (series 3 image 20).  There is no associated T2 or FLAIR hyperintensity in these areas.  There is mildly abnormal diffusion signal in the medial left thalamus.  No other areas of restricted diffusion.  No convincing major vascular territory infarct.  Improved gray and white matter signal in the brain since September. Most of the abnormal signal in the periphery of the brain has resolved. No cortical encephalomalacia has developed.  There is some residual T2 and FLAIR hyperintensity in the medial left thalamus corresponding to the diffusion finding today.  Signal in the right thalamus has normalized.  Brainstem and cerebellum are within normal limits.  Major intracranial vascular flow voids are preserved.  No ventriculomegaly. No midline  shift, mass effect, or evidence of mass lesion.  No acute intracranial hemorrhage identified. Negative pituitary, cervicomedullary junction and visualized cervical spine.  Normal bone marrow signal.  Visualized orbit soft tissues are within normal limits.  Visualized paranasal sinuses and mastoids are clear.  Negative scalp soft tissues.  IMPRESSION: 1.  Improved gray-white matter signal in the brain compared to the abnormal September study.  Residual signal abnormality in the left thalamus. 2.  New low level abnormal diffusion signal in the left cingulate gyrus and some cortex at the anterior left superior frontal gyrus. 3.  The findings are nonspecific and the new signal abnormality might relate to recent seizure activity or mild ischemia.   A viral or atypical encephalitis is felt less likely given the fairly unilateral involvement.   Original Report Authenticated By: Erskine Speed, M.D.      Assessment/Plan:  Patient Active Hospital Problem List: Encephalopathy (08/20/2012)   Assessment: Encephalopathy likely not acute  but from review of her old records she has had some mental status issues since that time.  Her recent presentation has likely not made this better for her and she has continued to use illicit drugs.  Imaging actually shows some resolution of the majority of her previous PRES findings and is really only significant now for findings consistent with her presentation of status epilepticus.  These findings should resolve over time as well as long as her seizures are controlled.  Patient does have abnormal liver functions.  Would rule out the possibility of some hepatic involvement that may be further affecting her mental status.     Plan:  1. Continue folic acid and thiamine daily  2. NH4 level Status epilepticus (08/20/2012)   Assessment: Seizures controlled on Keppra.   Plan:  1.  Would continue current dose of Keppra    Thana Farr, MD [facility] [phone] 08/29/2012, 3:43 PM

## 2012-08-30 DIAGNOSIS — I10 Essential (primary) hypertension: Secondary | ICD-10-CM

## 2012-08-30 LAB — CBC
Hemoglobin: 15.5 g/dL — ABNORMAL HIGH (ref 12.0–15.0)
MCH: 30 pg (ref 26.0–34.0)
MCHC: 32.9 g/dL (ref 30.0–36.0)
MCV: 91.3 fL (ref 78.0–100.0)
Platelets: 292 10*3/uL (ref 150–400)

## 2012-08-30 LAB — BASIC METABOLIC PANEL
CO2: 25 mEq/L (ref 19–32)
Calcium: 9.2 mg/dL (ref 8.4–10.5)
Creatinine, Ser: 0.46 mg/dL — ABNORMAL LOW (ref 0.50–1.10)
GFR calc non Af Amer: >90 mL/min (ref >90)
Glucose, Bld: 119 mg/dL — ABNORMAL HIGH (ref 70–99)
Sodium: 135 mEq/L (ref 135–145)

## 2012-08-30 LAB — GLUCOSE, CAPILLARY: Glucose-Capillary: 148 mg/dL — ABNORMAL HIGH (ref 70–99)

## 2012-08-30 MED ORDER — HYDRALAZINE HCL 25 MG PO TABS
25.0000 mg | ORAL_TABLET | Freq: Four times a day (QID) | ORAL | Status: DC | PRN
  Filled 2012-08-30: qty 1

## 2012-08-30 MED ORDER — POTASSIUM CHLORIDE CRYS ER 20 MEQ PO TBCR
40.0000 meq | EXTENDED_RELEASE_TABLET | Freq: Once | ORAL | Status: AC
  Administered 2012-08-30: 40 meq via ORAL
  Filled 2012-08-30: qty 2

## 2012-08-30 MED ORDER — PNEUMOCOCCAL VAC POLYVALENT 25 MCG/0.5ML IJ INJ
0.5000 mL | INJECTION | INTRAMUSCULAR | Status: AC
  Administered 2012-08-30: 0.5 mL via INTRAMUSCULAR
  Filled 2012-08-30: qty 0.5

## 2012-08-30 MED ORDER — ALPRAZOLAM 0.5 MG PO TABS
1.0000 mg | ORAL_TABLET | Freq: Three times a day (TID) | ORAL | Status: DC
  Administered 2012-08-30 – 2012-09-10 (×32): 1 mg via ORAL
  Filled 2012-08-30 (×2): qty 2
  Filled 2012-08-30: qty 1
  Filled 2012-08-30 (×15): qty 2
  Filled 2012-08-30: qty 1
  Filled 2012-08-30 (×15): qty 2

## 2012-08-30 MED ORDER — HYDRALAZINE HCL 10 MG PO TABS
10.0000 mg | ORAL_TABLET | Freq: Four times a day (QID) | ORAL | Status: DC | PRN
  Filled 2012-08-30: qty 1

## 2012-08-30 NOTE — Progress Notes (Signed)
Patient's bed alarm went of and staff members ran to the room.  Patient was found on the floor with an apparent fall that was witnessed via room camera.  Patient only complain of pain was on right knee where a small bruise was also noted.  No other abnormality on inspection. No other complaints from patient.  She denies numbness and has full sensation to the right leg, capilary refill less than 3 seconds.  Patient reveals she was trying to go to the bathroom.  She was helped to the bedside commode.  Vital signs that was subsequently taken was stable.  Son was notified by phone and MD notified.

## 2012-08-30 NOTE — Clinical Social Work Note (Signed)
Patoient discussed during morning progression meeting and she continues to need a sitter due to her impulsiveness. Options were discussed in order to d/c sitter, which will allow patient to discharge to a skilled nursing facility.  CSW will continue to monitor patient's progress and assist with SNF placement.  Genelle Bal, MSW, LCSW [phone]

## 2012-08-30 NOTE — Progress Notes (Signed)
[facility] PROGRESS NOTE  SHAVETTE SHOAFF NWG:[id number] DOB: 1956/07/13 DOA: 08/20/2012 PCP: Louie Boston, MD  Brief narrative:  56 year old female patient was found unresponsive by her neighbors. O2 sat was 76% in route to the hospital despite EMS placing patient on CPAP. She has a history of polysubstance abuse including cocaine and noncompliance. Upon arrival to the emergency department at [facility], the patient was quite hypoxic and began having a seizure and was immediately intubated by the emergency room physician. Subsequent EKG revealed anterior ST segment elevation. Cardiologist on call reviewed the EKG. Because of patient's history of prior normal cardiac catheterization in 2011 and other explanatory factors for patient's abnormal EKG and altered mental status, severe hypoxia and seizure and fevers it was determined code STEMI would not be activated. Because of need for ICU admission and patient subsequently developed recurrent seizures consistent with status epilepticus pulmonary critical care medicine except that the patient in transfer [facility]. In the interim she was started on a Versed drip. Because of the EKG changes she was also started on IV heparin. She was also given Ativan to help suppress her seizures and was loaded with Keppra.  Additional history was obtained after admission confirming that patient had been found unresponsive with an empty oxycodone bottle next to her. Her urine drug screen was positive for cocaine after arrival. There were concerns that patient may have aspiration pneumonitis so she was briefly given antibiotics. CT of the head was negative despite no dictation of right-sided weakness which apparently is chronic. Patient eventually self extubated on 08/24/2012. She was subsequently transferred out of the ICU on 08/26/2012 [facility].    Assessment/Plan:  Principle problem:  STEMI (ST elevation myocardial infarction) due to acute cocaine use  -not  felt a candidate for acute cardiac catheterization . -previous catheterization June 2011 revealed no coronary artery disease  -Troponin peaked at 57 and has trended downward- initial EKG had ST elevation in anterior leads.  - likely MI precipitated by concomitant use of cocaine with associated overdose and hypoxemia  -patient has encephalopathy with significant word finding difficulty and cardiology recommend for non invasive treatment for now.  -Because of cocaine use cannot use beta blocker. Added lisinopril.  -Echocardiogram this admission shows no new regional wall motion abnormalities and demonstrates persistent apical akinesis .   Active Problems:  Acute encephalopathy  *Acute encephalopathy likely from the above-stated metabolic issues  Patent was admitted at [facility] in September with diagnosis of wernicke's encephalopathy. An MRI obtained at that time was suggestive Posterior reversible encephalopathy syndrome( PRES). apparently she got better on discharge.  -repeat MRI showed Improved gray-white matter signal in the brain compared to previous study. . Residual signal abnormality in the left thalamus suggests possible seizure vs ischemia.  -Continue with AED for now. Seen by neurology who recommends to continue Keppra for now. -Continue thiamine and folate. ammonia level within normal limits -appreciate psych recs. No suicidal ideation. -[name] was discontinued however had to be placed back because of fall risk. -needs SNF on discharge however having fall risk and need of a sitter may prolong hospital stay.  Acute respiratory failure with hypoxia/ Aspiration pneumonitis  *Multifactorial secondary to narcotic overdose with resultant hypoxemia and altered mentation requiring intubation  *Likely also has some aspiration pneumonitis . patient has completed a course of Unasyn  *Continue supportive care.   AKI  Likely prerenal with hypoperfusion. Now resolved   Thrombocytopenia  Resolved    Status epilepticus  Possibly substance induced seizure.  No seizure activity since 48 hrs post admission  Cont keppra   Hep /Cirrhosis with alcoholism  *Cirrhotic changes seen on previous imaging but total bilirubin normal  Dysphagia  On dys level 3 diet   Hypokalemia  *Resolved   COPD (chronic obstructive pulmonary disease)  *Stable . Has scattered rhonchi. Cont prn nebs   Hypernatremia  *Resolved   Chronic diastolic congestive heart failure, NYHA class 1  stable  DVT prophylaxis   consults PCCM  cardiology  psych  Neurology   Antibiotics:  Levaquin 12/10 x 1  Vancomycin 12/10 x 1  Unasyn 12/10 >>> 12/14     HPI/Subjective:  Patient sustained a fall overnight.  No injury sustained. Still confused with word finding difficulty.   Objective: Filed Vitals:   08/30/12 0325 08/30/12 0450 08/30/12 0942 08/30/12 1440  BP:  175/117 144/89 145/101  Pulse:  105 108 118  Temp:  99 F (37.2 C) 98.9 F (37.2 C) 98.4 F (36.9 C)  TempSrc:  Oral Oral Oral  Resp:  20 20 20   Height:      Weight: 57.38 kg (126 lb 8 oz)     SpO2:  90% 92% 93%    Intake/Output Summary (Last 24 hours) at 08/30/12 1511 Last data filed at 08/30/12 1437  Gross per 24 hour  Intake    600 ml  Output    451 ml  Net    149 ml   Filed Weights   08/27/12 2102 08/28/12 2038 08/30/12 0325  Weight: 58.968 kg (130 lb) 59.467 kg (131 lb 1.6 oz) 57.38 kg (126 lb 8 oz)    Exam:  General: middle aged female In NAD. Appears dishelved  HEENT: no pallor, moist oral mucosa  Cardiovascular: NS1 & S2, no murmurs  Respiratory: scattered rhonchi b/l  Abdomen: soft, NT, ND, BS+  Ext: warm no edema  CNS: alert, oriented to place and self, has significant word finding difficulty MMSE done by psych and appears diminished   Data Reviewed: Basic Metabolic Panel:  Lab 08/30/12 2956 08/29/12 0545 08/28/12 0945 08/27/12 0425 08/26/12 2027 08/24/12 0445  NA 135 141 141 141 135 --  K 3.3* 3.4* 3.7 3.5  3.2* --  CL 96 107 104 104 100 --  CO2 25 26 26 27 25  --  GLUCOSE 119* 93 107* 113* 100* --  BUN 5* 8 5* <3* <3* --  CREATININE 0.46* 0.58 0.52 0.52 0.50 --  CALCIUM 9.2 8.3* 8.4 8.9 8.7 --  MG -- -- -- -- -- 1.6  PHOS -- -- -- -- -- 2.4   Liver Function Tests:  Lab 08/24/12 0445  AST 66*  ALT 112*  ALKPHOS 78  BILITOT 0.2*  PROT 5.1*  ALBUMIN 2.5*   No results found for this basename: LIPASE:5,AMYLASE:5 in the last 168 hours  Lab 08/30/12 0505  AMMONIA 42   CBC:  Lab 08/30/12 0500 08/29/12 0545 08/28/12 0515 08/27/12 0425 08/26/12 0444  WBC 6.1 4.8 4.3 5.2 6.1  NEUTROABS -- -- -- -- --  HGB 15.5* 13.8 14.4 14.4 15.5*  HCT 47.1* 42.4 43.3 43.0 45.0  MCV 91.3 93.0 92.5 90.9 90.0  PLT 292 234 189 169 147*   Cardiac Enzymes: No results found for this basename: CKTOTAL:5,CKMB:5,CKMBINDEX:5,TROPONINI:5 in the last 168 hours BNP (last 3 results)  Basename 04/23/12 0327 04/18/12 1924  PROBNP 19352.0* 16764.0*   CBG:  Lab 08/27/12 1133 08/27/12 0743 08/27/12 0320 08/26/12 2329 08/26/12 1945  GLUCAP 100* 110* 93 97 82  Recent Results (from the past 240 hour(s))  MRSA PCR SCREENING     Status: Normal   Collection Time   08/20/12  7:22 PM      Component Value Range Status Comment   MRSA by PCR NEGATIVE  NEGATIVE Final      Studies: Mr Brain Wo Contrast  08/29/2012  *RADIOLOGY REPORT*  Clinical Data: 56 year old female found unresponsive on 08/20/2012, possible drug overdose. Seizure, controlled status epilepticus at time of admission.  Abnormal MRI in September.  MRI HEAD WITHOUT CONTRAST  Technique:  Multiplanar, multiecho pulse sequences of the brain and surrounding structures were obtained according to standard protocol without intravenous contrast.  Comparison: Head CTs 08/23/2012 and earlier.  Brain MRI 05/27/2012.  Findings: Diffusion signal appears to be abnormal in the left cingulate gyrus, no definite involvement on the right. More subtle abnormal asymmetric  diffusion signal in the left superior frontal gyrus (series 3 image 20).  There is no associated T2 or FLAIR hyperintensity in these areas.  There is mildly abnormal diffusion signal in the medial left thalamus.  No other areas of restricted diffusion.  No convincing major vascular territory infarct.  Improved gray and white matter signal in the brain since September. Most of the abnormal signal in the periphery of the brain has resolved. No cortical encephalomalacia has developed.  There is some residual T2 and FLAIR hyperintensity in the medial left thalamus corresponding to the diffusion finding today.  Signal in the right thalamus has normalized.  Brainstem and cerebellum are within normal limits.  Major intracranial vascular flow voids are preserved.  No ventriculomegaly. No midline shift, mass effect, or evidence of mass lesion.  No acute intracranial hemorrhage identified. Negative pituitary, cervicomedullary junction and visualized cervical spine.  Normal bone marrow signal.  Visualized orbit soft tissues are within normal limits.  Visualized paranasal sinuses and mastoids are clear.  Negative scalp soft tissues.  IMPRESSION: 1.  Improved gray-white matter signal in the brain compared to the abnormal September study.  Residual signal abnormality in the left thalamus. 2.  New low level abnormal diffusion signal in the left cingulate gyrus and some cortex at the anterior left superior frontal gyrus. 3.  The findings are nonspecific and the new signal abnormality might relate to recent seizure activity or mild ischemia.   A viral or atypical encephalitis is felt less likely given the fairly unilateral involvement.   Original Report Authenticated By: Erskine Speed, M.D.     Scheduled Meds:   . ALPRAZolam  0.5 mg Oral TID  . amitriptyline  25 mg Oral QHS  . antiseptic oral rinse  15 mL Mouth Rinse QID  . aspirin  325 mg Oral Daily  . carBAMazepine  400 mg Per Tube TID  . celecoxib  100 mg Oral BID  .  chlorhexidine  15 mL Mouth Rinse BID  . DULoxetine  30 mg Oral BID  . feeding supplement  1 Container Oral TID BM  . folic acid  1 mg Oral Daily  . gabapentin  100 mg Oral TID  . heparin subcutaneous  5,000 Units Subcutaneous Q8H  . levETIRAcetam  1,000 mg Oral BID  . lisinopril  10 mg Oral Daily  . thiamine  100 mg Per Tube TID   Continuous Infusions:     Time spent: 30 minutes    Trindon Dorton  [facility] Pager [phone]. If 8PM-8AM, please contact night-coverage at [email or web address], password [facility] 08/30/2012, 3:11 PM  LOS: 10 days

## 2012-08-30 NOTE — Progress Notes (Signed)
Physical Therapy Treatment Patient Details Name: Donna [name] MRN: [id number] DOB: Dec 10, 1955 Today's Date: 08/30/2012 Time: 2952-8413 PT Time Calculation (min): 12 min  PT Assessment / Plan / Recommendation Comments on Treatment Session  Pt adm with STEMI and seizure.  Pt with drug abuse.  Pt making progress with mobility but cognitive impairments remain severe.      Follow Up Recommendations  SNF     Does the patient have the potential to tolerate intense rehabilitation     Barriers to Discharge        Equipment Recommendations  Rolling walker with 5" wheels    Recommendations for Other Services    Frequency Min 3X/week   Plan Discharge plan needs to be updated;Frequency remains appropriate    Precautions / Restrictions Precautions Precautions: Fall   Pertinent Vitals/Pain N/A    Mobility  Transfers Sit to Stand: 4: Min assist;With upper extremity assist;With armrests;From chair/3-in-1 Stand to Sit: 4: Min assist;With upper extremity assist;With armrests;To chair/3-in-1 Ambulation/Gait Ambulation/Gait Assistance: 4: Min assist;3: Mod assist Ambulation Distance (Feet): 150 Feet Assistive device: Rolling walker;1 person hand held assist Ambulation/Gait Assistance Details: Had to hold rt hand in place on walker and help steer walker due to rt inattention.  With hand-held pt required mod A at times. Gait Pattern: Step-through pattern;Decreased stride length;Decreased step length - left;Right genu recurvatum;Narrow base of support;Scissoring Gait velocity: decr    Exercises     PT Diagnosis:    PT Problem List:   PT Treatment Interventions:     PT Goals Acute Rehab PT Goals Pt will go Sit to Stand: with supervision PT Goal: Sit to Stand - Progress: Updated due to goal met Pt will go Stand to Sit: with supervision PT Goal: Stand to Sit - Progress: Updated due to goals met Pt will Ambulate: 51 - 150 feet;with supervision;with least restrictive assistive device PT  Goal: Ambulate - Progress: Updated due to goal met  Visit Information  Last PT Received On: 08/30/12 Assistance Needed: +1    Subjective Data  Subjective: "I need to go to the bathroom."   Cognition  Arousal/Alertness: Awake/alert Orientation Level: Disoriented to;Place;Time;Situation Behavior During Session: Restless Current Attention Level: Focused Following Commands: Follows one step commands consistently Safety/Judgement: Decreased awareness of safety precautions;Decreased safety judgement for tasks assessed;Impulsive;Decreased awareness of need for assistance Safety/Judgement - Other Comments: Very impulsive and inattention to rt side Problem Solving: slow to process    Balance  Static Standing Balance Static Standing - Balance Support: Right upper extremity supported Static Standing - Level of Assistance: 4: Min assist  End of Session PT - End of Session Equipment Utilized During Treatment: Gait belt Activity Tolerance: Treatment limited secondary to medical complications (Comment) Patient left: in chair;with call bell/phone within reach;Other (comment) (with sitter present)   GP     [facility] 08/30/2012, 10:32 AM  Skip Mayer PT [phone]

## 2012-08-30 NOTE — Progress Notes (Signed)
OT Cancellation Note  Patient Details Name: Donna [name] MRN: [id number] DOB: 07/30/1956   Cancelled Treatment:    Reason Eval/Treat Not Completed: Fatigue/lethargy limiting ability to participate. Pt recently returned to bed and too lethargic to participate in OT at this time.  Recently had PT during which she sat in chair briefly but then returned to bed.   08/30/2012 Cipriano Mile OTR/L Pager [phone] Office [phone]

## 2012-08-31 DIAGNOSIS — R112 Nausea with vomiting, unspecified: Secondary | ICD-10-CM

## 2012-08-31 LAB — CBC
MCH: 30.9 pg (ref 26.0–34.0)
MCHC: 33.2 g/dL (ref 30.0–36.0)
Platelets: 280 10*3/uL (ref 150–400)
RDW: 13 % (ref 11.5–15.5)

## 2012-08-31 LAB — BASIC METABOLIC PANEL
Calcium: 9 mg/dL (ref 8.4–10.5)
GFR calc Af Amer: >90 mL/min (ref >90)
GFR calc non Af Amer: >90 mL/min (ref >90)
Potassium: 3.7 mEq/L (ref 3.5–5.1)
Sodium: 139 mEq/L (ref 135–145)

## 2012-08-31 MED ORDER — PANTOPRAZOLE SODIUM 40 MG PO TBEC
40.0000 mg | DELAYED_RELEASE_TABLET | Freq: Two times a day (BID) | ORAL | Status: DC
  Administered 2012-08-31 – 2012-09-10 (×19): 40 mg via ORAL
  Filled 2012-08-31 (×16): qty 1

## 2012-08-31 NOTE — Progress Notes (Addendum)
[facility] PROGRESS NOTE  Donna [name]:[id number] DOB: Jul 05, 1956 DOA: 08/20/2012 PCP: Louie Boston, MD  Brief narrative:  56 year old female patient was found unresponsive by her neighbors. O2 sat was 76% in route to the hospital despite EMS placing patient on CPAP. She has a history of polysubstance abuse including cocaine and noncompliance. Upon arrival to the emergency department at [facility], the patient was quite hypoxic and began having a seizure and was immediately intubated by the emergency room physician. Subsequent EKG revealed anterior ST segment elevation. Cardiologist on call reviewed the EKG. Because of patient's history of prior normal cardiac catheterization in 2011 and other explanatory factors for patient's abnormal EKG and altered mental status, severe hypoxia and seizure and fevers it was determined code STEMI would not be activated. Because of need for ICU admission and patient subsequently developed recurrent seizures consistent with status epilepticus pulmonary critical care medicine except that the patient in transfer at [facility]. In the interim she was started on a Versed drip. Because of the EKG changes she was also started on IV heparin. She was also given Ativan to help suppress her seizures and was loaded with Keppra.  Additional history was obtained after admission confirming that patient had been found unresponsive with an empty oxycodone bottle next to her. Her urine drug screen was positive for cocaine after arrival. There were concerns that patient may have aspiration pneumonitis so she was briefly given antibiotics. CT of the head was negative despite no dictation of right-sided weakness which apparently is chronic. Patient eventually self extubated on 08/24/2012. She was subsequently transferred out of the ICU on 08/26/2012 [facility].    Assessment/Plan:  Principle problem:  STEMI (ST elevation myocardial infarction) due to acute cocaine use  -not  felt a candidate for acute cardiac catheterization . -previous catheterization June 2011 revealed no coronary artery disease  -Troponin peaked at 57 and has trended downward- initial EKG had ST elevation in anterior leads.  - likely MI precipitated by concomitant use of cocaine with associated overdose and hypoxemia  -patient has encephalopathy with significant word finding difficulty and cardiology recommend for non invasive treatment for now.  -Because of cocaine use cannot use beta blocker. Added lisinopril.  -Echocardiogram this admission shows no new regional wall motion abnormalities and demonstrates persistent apical akinesis -cards to follow up for non invasive testing prior to d/c  Active Problems:  Acute encephalopathy  *Acute encephalopathy likely from the above-stated metabolic issues  Patent was admitted at [facility] in September with diagnosis of wernicke's encephalopathy. An MRI obtained at that time was suggestive Posterior reversible encephalopathy syndrome( PRES). apparently she got better on discharge.  -repeat MRI showed Improved gray-white matter signal in the brain compared to previous study. . Residual signal abnormality in the left thalamus suggests possible seizure vs ischemia.  -Continue with AED for now. Seen by neurology who recommends to continue Keppra for now. -Continue thiamine and folate. ammonia level within normal limits -appreciate psych recs. No suicidal ideation. -[name] was discontinued however had to be placed back because of fall risk. -needs SNF on discharge however having fall risk and need of a sitter may prolong hospital stay.  Acute respiratory failure with hypoxia/ Aspiration pneumonitis  *Multifactorial secondary to narcotic overdose with resultant hypoxemia and altered mentation requiring intubation  *Likely also has some aspiration pneumonitis . patient has completed a course of Unasyn  *Continue supportive care.   AKI  Likely prerenal with  hypoperfusion. Now resolved   Thrombocytopenia  Resolved  Status epilepticus  Possibly substance induced seizure. No seizure activity since 48 hrs post admission  Cont keppra   Hep /Cirrhosis with alcoholism  *Cirrhotic changes seen on previous imaging but total bilirubin normal  Dysphagia  On dys level 3 diet   Hypokalemia  *Resolved   COPD (chronic obstructive pulmonary disease)  *Stable .Cont prn nebs   Hypernatremia  *Resolved   Probable GERDvs alcohol related gastritis -as above nauseous with epigastric tenderness on exam, start PPI, continue antiemetics as needed.  Chronic diastolic congestive heart failure, NYHA class 1  stable  DVT prophylaxis  consults PCCM  cardiology  psych  Neurology   Antibiotics:  Levaquin 12/10 x 1  Vancomycin 12/10 x 1  Unasyn 12/10 >>> 12/14     HPI/Subjective:  Patient nauseous and 'spitting up' today. Denies chest pain. No seizures reported.  Objective: Filed Vitals:   08/30/12 1800 08/30/12 2250 08/31/12 0721 08/31/12 1335  BP: 121/82 111/48 103/75 144/92  Pulse: 118 100 115 100  Temp: 97.5 F (36.4 C) 97.8 F (36.6 C) 97.7 F (36.5 C) 98.1 F (36.7 C)  TempSrc: Oral Oral Oral Oral  Resp: 18 16 18 18   Height:      Weight:      SpO2: 96% 97% 96% 97%    Intake/Output Summary (Last 24 hours) at 08/31/12 1743 Last data filed at 08/31/12 0919  Gross per 24 hour  Intake    600 ml  Output    800 ml  Net   -200 ml   Filed Weights   08/27/12 2102 08/28/12 2038 08/30/12 0325  Weight: 58.968 kg (130 lb) 59.467 kg (131 lb 1.6 oz) 57.38 kg (126 lb 8 oz)    Exam:  General: middle aged female In NAD. Appears dishelved  HEENT: no pallor, moist oral mucosa  Cardiovascular: NS1 & S2, no murmurs  Respiratory: moderate air mov't, few rhonchi Abdomen: soft, epgastric tenderness present, ND, BS+  Ext: warm no edema  CNS: alert, oriented to place and self, has significant word finding difficulty MMSE done by psych and  appears diminished   Data Reviewed: Basic Metabolic Panel:  Lab 08/31/12 1610 08/30/12 0500 08/29/12 0545 08/28/12 0945 08/27/12 0425  NA 139 135 141 141 141  K 3.7 3.3* 3.4* 3.7 3.5  CL 100 96 107 104 104  CO2 27 25 26 26 27   GLUCOSE 99 119* 93 107* 113*  BUN 10 5* 8 5* <3*  CREATININE 0.70 0.46* 0.58 0.52 0.52  CALCIUM 9.0 9.2 8.3* 8.4 8.9  MG -- -- -- -- --  PHOS -- -- -- -- --   Liver Function Tests: No results found for this basename: AST:5,ALT:5,ALKPHOS:5,BILITOT:5,PROT:5,ALBUMIN:5 in the last 168 hours No results found for this basename: LIPASE:5,AMYLASE:5 in the last 168 hours  Lab 08/30/12 0505  AMMONIA 42   CBC:  Lab 08/31/12 0605 08/30/12 0500 08/29/12 0545 08/28/12 0515 08/27/12 0425  WBC 6.9 6.1 4.8 4.3 5.2  NEUTROABS -- -- -- -- --  HGB 15.8* 15.5* 13.8 14.4 14.4  HCT 47.6* 47.1* 42.4 43.3 43.0  MCV 93.0 91.3 93.0 92.5 90.9  PLT 280 292 234 189 169   Cardiac Enzymes: No results found for this basename: CKTOTAL:5,CKMB:5,CKMBINDEX:5,TROPONINI:5 in the last 168 hours BNP (last 3 results)  Basename 04/23/12 0327 04/18/12 1924  PROBNP 19352.0* 16764.0*   CBG:  Lab 08/27/12 1133 08/27/12 0743 08/27/12 0320 08/26/12 2329 08/26/12 1945  GLUCAP 100* 110* 93 97 82    No results found for this  or any previous visit (from the past 240 hour(s)).   Studies: No results found.  Scheduled Meds:    . ALPRAZolam  1 mg Oral TID  . amitriptyline  25 mg Oral QHS  . aspirin  325 mg Oral Daily  . carBAMazepine  400 mg Per Tube TID  . celecoxib  100 mg Oral BID  . DULoxetine  30 mg Oral BID  . feeding supplement  1 Container Oral TID BM  . folic acid  1 mg Oral Daily  . gabapentin  100 mg Oral TID  . heparin subcutaneous  5,000 Units Subcutaneous Q8H  . levETIRAcetam  1,000 mg Oral BID  . lisinopril  10 mg Oral Daily  . thiamine  100 mg Per Tube TID   Continuous Infusions:     Time spent: 30 minutes    Vaishnavi Dalby C  [facility] Pager  [phone]. If 8PM-8AM, please contact night-coverage at [email or web address], password [facility] 08/31/2012, 5:43 PM  LOS: 11 days

## 2012-09-01 DIAGNOSIS — K7030 Alcoholic cirrhosis of liver without ascites: Secondary | ICD-10-CM

## 2012-09-01 LAB — CBC
Hemoglobin: 15.2 g/dL — ABNORMAL HIGH (ref 12.0–15.0)
MCH: 31.1 pg (ref 26.0–34.0)
Platelets: 284 10*3/uL (ref 150–400)
RBC: 4.88 MIL/uL (ref 3.87–5.11)
WBC: 5.9 10*3/uL (ref 4.0–10.5)

## 2012-09-01 LAB — BASIC METABOLIC PANEL
CO2: 28 mEq/L (ref 19–32)
Calcium: 9.2 mg/dL (ref 8.4–10.5)
Chloride: 99 mEq/L (ref 96–112)
Potassium: 3.6 mEq/L (ref 3.5–5.1)
Sodium: 138 mEq/L (ref 135–145)

## 2012-09-01 LAB — LIPASE, BLOOD: Lipase: 14 U/L (ref 11–59)

## 2012-09-01 MED ORDER — METOCLOPRAMIDE HCL 5 MG PO TABS
5.0000 mg | ORAL_TABLET | Freq: Two times a day (BID) | ORAL | Status: DC | PRN
  Filled 2012-09-01: qty 1

## 2012-09-01 MED ORDER — BISACODYL 5 MG PO TBEC
10.0000 mg | DELAYED_RELEASE_TABLET | Freq: Every day | ORAL | Status: DC | PRN
  Administered 2012-09-01: 10 mg via ORAL
  Filled 2012-09-01: qty 2

## 2012-09-01 NOTE — Progress Notes (Addendum)
[facility] PROGRESS NOTE  Donna [name] ZOX:[id number] DOB: 1956-04-28 DOA: 08/20/2012 PCP: Louie Boston, MD  Brief narrative:  56 year old female patient was found unresponsive by her neighbors. O2 sat was 76% in route to the hospital despite EMS placing patient on CPAP. She has a history of polysubstance abuse including cocaine and noncompliance. Upon arrival to the emergency department at [facility], the patient was quite hypoxic and began having a seizure and was immediately intubated by the emergency room physician. Subsequent EKG revealed anterior ST segment elevation. Cardiologist on call reviewed the EKG. Because of patient's history of prior normal cardiac catheterization in 2011 and other explanatory factors for patient's abnormal EKG and altered mental status, severe hypoxia and seizure and fevers it was determined code STEMI would not be activated. Because of need for ICU admission and patient subsequently developed recurrent seizures consistent with status epilepticus pulmonary critical care medicine except that the patient in transfer at [facility]. In the interim she was started on a Versed drip. Because of the EKG changes she was also started on IV heparin. She was also given Ativan to help suppress her seizures and was loaded with Keppra.  Additional history was obtained after admission confirming that patient had been found unresponsive with an empty oxycodone bottle next to her. Her urine drug screen was positive for cocaine after arrival. There were concerns that patient may have aspiration pneumonitis so she was briefly given antibiotics. CT of the head was negative despite no dictation of right-sided weakness which apparently is chronic. Patient eventually self extubated on 08/24/2012. She was subsequently transferred out of the ICU on 08/26/2012 to [facility].    Assessment/Plan:  Principle problem:  STEMI (ST elevation myocardial infarction) due to acute cocaine use  -not  felt a candidate for acute cardiac catheterization . -previous catheterization June 2011 revealed no coronary artery disease  -Troponin peaked at 40 and has trended downward- initial EKG had ST elevation in anterior leads.  - likely MI precipitated by concomitant use of cocaine with associated overdose and hypoxemia  -patient has encephalopathy with significant word finding difficulty and cardiology recommend for non invasive treatment for now.  -Because of cocaine use cannot use beta blocker. Added lisinopril.  -Echocardiogram this admission shows no new regional wall motion abnormalities and demonstrates persistent apical akinesis -cards to follow up for non invasive testing prior to d/c  Active Problems:  Acute encephalopathy  *Acute encephalopathy likely from the above-stated metabolic issues  Patent was admitted at [facility] in September with diagnosis of wernicke's encephalopathy. An MRI obtained at that time was suggestive Posterior reversible encephalopathy syndrome( PRES). apparently she got better on discharge.  -repeat MRI showed Improved gray-white matter signal in the brain compared to previous study. . Residual signal abnormality in the left thalamus suggests possible seizure vs ischemia.  -Continue with AED for now. Seen by neurology who recommends to continue Keppra for now. -Continue thiamine and folate. ammonia level within normal limits -appreciate psych recs. No suicidal ideation. -[name] was discontinued however had to be placed back because of fall risk. -needs SNF on discharge however having fall risk and need of a sitter may prolong hospital stay. - now clinically improved Acute respiratory failure with hypoxia/ Aspiration pneumonitis  *Multifactorial secondary to narcotic overdose with resultant hypoxemia and altered mentation requiring intubation  *Likely also has some aspiration pneumonitis . patient has completed a course of Unasyn  *Continue supportive care.   AKI   Likely prerenal with hypoperfusion. Now resolved  Thrombocytopenia  Resolved   Status epilepticus  Possibly substance induced seizure. No seizure activity since 48 hrs post admission  Cont keppra   Hep /Cirrhosis with alcoholism  *Cirrhotic changes seen on previous imaging but total bilirubin normal  Dysphagia  On dys level 3 diet   Hypokalemia  *Resolved   COPD (chronic obstructive pulmonary disease)  *Stable .Cont prn nebs   Hypernatremia  *Resolved   Probable GERDvs alcohol related gastritis -as above nauseous with epigastric tenderness on exam,continue PPI, add reglan low dose prncontinue antiemetics as needed.  Chronic diastolic congestive heart failure, NYHA class 1  stable   Constipation -dulcolax prn and follow  N/V -constipation may be contributing -will also obtain UA and lipase -symptomatic management as above DVT prophylaxis  consults PCCM  cardiology  psych  Neurology   Antibiotics:  Levaquin 12/10 x 1  Vancomycin 12/10 x 1  Unasyn 12/10 >>> 12/14     HPI/Subjective:  C/o vomiting x1after lunch today, and +constipation. Denies abd pain. Looks better today overall.denies chest pain  Objective: Filed Vitals:   08/31/12 2123 09/01/12 0524 09/01/12 1102 09/01/12 1415  BP: 143/88 91/59 94/60  118/87  Pulse: 107 103 118 98  Temp: 97.9 F (36.6 C) 97.6 F (36.4 C) 98 F (36.7 C) 98 F (36.7 C)  TempSrc: Oral Oral Oral Oral  Resp: 17 18 18 18   Height:      Weight: 58.015 kg (127 lb 14.4 oz)     SpO2: 97% 90% 94% 94%    Intake/Output Summary (Last 24 hours) at 09/01/12 1627 Last data filed at 09/01/12 1233  Gross per 24 hour  Intake    600 ml  Output      0 ml  Net    600 ml   Filed Weights   08/28/12 2038 08/30/12 0325 08/31/12 2123  Weight: 59.467 kg (131 lb 1.6 oz) 57.38 kg (126 lb 8 oz) 58.015 kg (127 lb 14.4 oz)    Exam:  General: middle aged female In NAD. Appears dishelved  HEENT: no pallor, moist oral mucosa   Cardiovascular: NS1 & S2, no murmurs  Respiratory: moderate air mov't, few rhonchi Abdomen: soft,mild epigastric tenderness ND, BS+  Ext: warm no edema  CNS: alert, answers simple questions appropriately, nonfocal  Data Reviewed: Basic Metabolic Panel:  Lab 09/01/12 4540 08/31/12 0605 08/30/12 0500 08/29/12 0545 08/28/12 0945  NA 138 139 135 141 141  K 3.6 3.7 3.3* 3.4* 3.7  CL 99 100 96 107 104  CO2 28 27 25 26 26   GLUCOSE 97 99 119* 93 107*  BUN 8 10 5* 8 5*  CREATININE 0.57 0.70 0.46* 0.58 0.52  CALCIUM 9.2 9.0 9.2 8.3* 8.4  MG -- -- -- -- --  PHOS -- -- -- -- --   Liver Function Tests: No results found for this basename: AST:5,ALT:5,ALKPHOS:5,BILITOT:5,PROT:5,ALBUMIN:5 in the last 168 hours No results found for this basename: LIPASE:5,AMYLASE:5 in the last 168 hours  Lab 08/30/12 0505  AMMONIA 42   CBC:  Lab 09/01/12 0705 08/31/12 0605 08/30/12 0500 08/29/12 0545 08/28/12 0515  WBC 5.9 6.9 6.1 4.8 4.3  NEUTROABS -- -- -- -- --  HGB 15.2* 15.8* 15.5* 13.8 14.4  HCT 46.0 47.6* 47.1* 42.4 43.3  MCV 94.3 93.0 91.3 93.0 92.5  PLT 284 280 292 234 189   Cardiac Enzymes: No results found for this basename: CKTOTAL:5,CKMB:5,CKMBINDEX:5,TROPONINI:5 in the last 168 hours BNP (last 3 results)  Basename 04/23/12 0327 04/18/12 1924  PROBNP 19352.0* 16764.0*  CBG:  Lab 08/27/12 1133 08/27/12 0743 08/27/12 0320 08/26/12 2329 08/26/12 1945  GLUCAP 100* 110* 93 97 82    No results found for this or any previous visit (from the past 240 hour(s)).   Studies: No results found.  Scheduled Meds:    . ALPRAZolam  1 mg Oral TID  . amitriptyline  25 mg Oral QHS  . aspirin  325 mg Oral Daily  . carBAMazepine  400 mg Per Tube TID  . celecoxib  100 mg Oral BID  . DULoxetine  30 mg Oral BID  . feeding supplement  1 Container Oral TID BM  . folic acid  1 mg Oral Daily  . gabapentin  100 mg Oral TID  . heparin subcutaneous  5,000 Units Subcutaneous Q8H  . levETIRAcetam   1,000 mg Oral BID  . lisinopril  10 mg Oral Daily  . pantoprazole  40 mg Oral BID AC  . thiamine  100 mg Per Tube TID   Continuous Infusions:     Time spent: 30 minutes    [place] [facility] C  [facility] Pager [phone]. If 8PM-8AM, please contact night-coverage at [email or web address], password [facility] 09/01/2012, 4:27 PM  LOS: 12 days

## 2012-09-01 NOTE — Progress Notes (Signed)
[id number] NT (sitter ) claims pt had been attemping to void but had difficulty; bladder scan done  701cc. MD notified

## 2012-09-02 DIAGNOSIS — K219 Gastro-esophageal reflux disease without esophagitis: Secondary | ICD-10-CM

## 2012-09-02 LAB — CBC
Hemoglobin: 14.4 g/dL (ref 12.0–15.0)
MCV: 93.4 fL (ref 78.0–100.0)
Platelets: 298 10*3/uL (ref 150–400)
RBC: 4.69 MIL/uL (ref 3.87–5.11)
WBC: 6 10*3/uL (ref 4.0–10.5)

## 2012-09-02 LAB — URINALYSIS, ROUTINE W REFLEX MICROSCOPIC
Glucose, UA: NEGATIVE mg/dL
Hgb urine dipstick: NEGATIVE
Leukocytes, UA: NEGATIVE
pH: 7 (ref 5.0–8.0)

## 2012-09-02 LAB — BASIC METABOLIC PANEL
CO2: 26 mEq/L (ref 19–32)
Chloride: 97 mEq/L (ref 96–112)
Creatinine, Ser: 0.63 mg/dL (ref 0.50–1.10)
Glucose, Bld: 93 mg/dL (ref 70–99)

## 2012-09-02 MED ORDER — SODIUM CHLORIDE 0.9 % IV BOLUS (SEPSIS)
250.0000 mL | Freq: Once | INTRAVENOUS | Status: AC
  Administered 2012-09-02: 250 mL via INTRAVENOUS

## 2012-09-02 MED ORDER — ENSURE COMPLETE PO LIQD
237.0000 mL | Freq: Two times a day (BID) | ORAL | Status: DC
  Administered 2012-09-02 – 2012-09-10 (×15): 237 mL via ORAL

## 2012-09-02 MED ORDER — SODIUM CHLORIDE 0.9 % IV SOLN
INTRAVENOUS | Status: AC
  Administered 2012-09-02: 50 mL via INTRAVENOUS
  Administered 2012-09-02: 21:00:00 via INTRAVENOUS

## 2012-09-02 MED ORDER — FLEET ENEMA 7-19 GM/118ML RE ENEM
1.0000 | ENEMA | Freq: Every day | RECTAL | Status: AC | PRN
  Filled 2012-09-02: qty 1

## 2012-09-02 NOTE — Progress Notes (Signed)
Patient Name: Donna [name] Date of Encounter: 09/02/2012  Principal Problem:  *STEMI (ST elevation myocardial infarction) due to acute cocaine use Active Problems:  HEPATITIS C  Acute respiratory failure with hypoxia  Hypokalemia  COPD (chronic obstructive pulmonary disease)  Acute encephalopathy  Status epilepticus  Acute renal failure  Aspiration pneumonitis  Thrombocytopenia  Hypernatremia  Wernicke encephalopathy syndrome  Cirrhosis with alcoholism  Dysphagia  Chronic diastolic congestive heart failure, NYHA class 1    SUBJECTIVE: Pt denies chest pain or SOB. Has trouble with memory and agrees with her granddaughter that her son should give consent for procedures. She does agree to a stress test.  OBJECTIVE Filed Vitals:   09/01/12 2119 09/02/12 0410 09/02/12 0933 09/02/12 1320  BP: 146/90 93/50 96/54  104/65  Pulse: 108 94 96 151  Temp: 98.1 F (36.7 C) 98.2 F (36.8 C) 98.9 F (37.2 C) 98.6 F (37 C)  TempSrc: Oral Oral Oral Oral  Resp: 18 16 18 18   Height: 5' 0.24" (1.53 m)     Weight: 126 lb 4.8 oz (57.289 kg)     SpO2: 92% 93% 99% 90%    Intake/Output Summary (Last 24 hours) at 09/02/12 1653 Last data filed at 09/01/12 2100  Gross per 24 hour  Intake    120 ml  Output    800 ml  Net   -680 ml   Filed Weights   08/30/12 0325 08/31/12 2123 09/01/12 2119  Weight: 126 lb 8 oz (57.38 kg) 127 lb 14.4 oz (58.015 kg) 126 lb 4.8 oz (57.289 kg)   PHYSICAL EXAM General: Well developed, well nourished, female in no acute distress. Head: Normocephalic, atraumatic, dentition very poor.  Neck: Supple without bruits, JVD not elevated. Lungs:  Resp regular and unlabored, rales bases, right > left. Heart: RRR, S1, S2, no S3, S4, or murmur. Abdomen: Soft, non-tender, non-distended, BS + x 4.  Extremities: No clubbing, cyanosis, No edema.  Neuro: Alert and oriented X 3, cannot always answer questions well. Moves all extremities spontaneously. Psych: Normal  affect.  LABS: CBC:  Basename 09/02/12 0559 09/01/12 0705  WBC 6.0 5.9  NEUTROABS -- --  HGB 14.4 15.2*  HCT 43.8 46.0  MCV 93.4 94.3  PLT 298 284   INR:No results found for this basename: INR in the last 72 hours Basic Metabolic Panel:  Basename 09/02/12 0559 09/01/12 0705  NA 134* 138  K 3.6 3.6  CL 97 99  CO2 26 28  GLUCOSE 93 97  BUN 9 8  CREATININE 0.63 0.57  CALCIUM 9.0 9.2  MG -- --  PHOS -- --   Cardiac Enzymes: Lab Results  Component Value Date   CKTOTAL 3339* 08/20/2012   CKMB 60.5* 08/20/2012   CKMB Index 1.8 08/20/2012   TROPONINI 3.86* 08/21/2012   TELE:SR, occ ST        Current Medications:     . ALPRAZolam  1 mg Oral TID  . amitriptyline  25 mg Oral QHS  . aspirin  325 mg Oral Daily  . carBAMazepine  400 mg Per Tube TID  . celecoxib  100 mg Oral BID  . DULoxetine  30 mg Oral BID  . feeding supplement  237 mL Oral BID BM  . folic acid  1 mg Oral Daily  . gabapentin  100 mg Oral TID  . heparin subcutaneous  5,000 Units Subcutaneous Q8H  . levETIRAcetam  1,000 mg Oral BID  . lisinopril  10 mg Oral Daily  . pantoprazole  40 mg Oral BID AC  . thiamine  100 mg Per Tube TID      . sodium chloride 50 mL (09/02/12 1235)    ASSESSMENT AND PLAN: Principal Problem:  *STEMI (ST elevation myocardial infarction) due to acute cocaine use - Previously normal coronaries in June 2011.   Think Myoview indicated, tried to contact son for permission, number for him in system did not work. Pt does not know any other number for him. Pt felt son should be the one to give permission, will ask nursing to see if they can find other numbers for him.   She is on ASA, has not been on beta blockers because of the cocaine. No statin because LFTs have been abnormal. If no ischemia on MV, do not think further eval indicated.  Active Problems:  HEPATITIS C  Acute respiratory failure with hypoxia  Hypokalemia  COPD (chronic obstructive pulmonary disease)  Acute  encephalopathy  Status epilepticus  Acute renal failure  Aspiration pneumonitis  Thrombocytopenia  Hypernatremia  Wernicke encephalopathy syndrome  Cirrhosis with alcoholism  Dysphagia  Chronic diastolic congestive heart failure, NYHA class 1   Signed, Theodore Demark , PA-C 4:53 PM 09/02/2012 Agree with myoview although the likelihood of a major abnormality is low with prior neg cath and reasonable wall motion.  There is an area of akinesis, but  May not be related to obstructive disease  tank

## 2012-09-02 NOTE — Progress Notes (Signed)
Attempted to contact pt's family members to get in touch with her son to get a consent for a procedure and had no success. Will cont to f/u.

## 2012-09-02 NOTE — Progress Notes (Signed)
Speech Language Pathology Dysphagia Treatment Patient Details Name: Donna [name] MRN: [id number] DOB: 1956-06-27 Today's Date: 09/02/2012 Time: 9629-5284 SLP Time Calculation (min): 27 min  Assessment / Plan / Recommendation Clinical Impression  session focused on tolerance of upgraded liquid texture. Following MBS on 12/17 pt was recommended to consume nectar thick liquids. For some reason over the weekend, pts diet with change to thin liquids and pts room changed, leaving swallow sign behind with precautions. Pt has been consuming thin liquids with no signs of infection. With trials of thin liquids via cup with small sips pt did not demonstrate any overt signs of aspiration. With larger boluses the pt did demonstrate immedaite cough, particualrly with straw. Pts sensation of aspiration appears to have improved though there is a continued concern for delay in swallow initiation. Recommend pt continue thin liquids and a dysphagia 3 diet though full supervision needed for small sips.  Treatment also provided for language and congnition. Pts automatic language is improving though semantic paraphasia persists. Perseveration and awareness have greatly improved. Min phonemic and sematic cues provided for word finding. Pt sustained attention to functional task with min assist.     Diet Recommendation  Continue with Current Diet: Dysphagia 3 (mechanical soft);Thin liquid    SLP Plan Continue with current plan of care   Pertinent Vitals/Pain NA   Swallowing Goals  SLP Swallowing Goals Patient will consume recommended diet without observed clinical signs of aspiration with: Minimal assistance Swallow Study Goal #1 - Progress: Progressing toward goal Patient will utilize recommended strategies during swallow to increase swallowing safety with: Minimal cueing Swallow Study Goal #2 - Progress: Progressing toward goal  General Temperature Spikes Noted: No Respiratory Status: Room  air Behavior/Cognition: Alert;Cooperative;Pleasant mood Oral Cavity - Dentition: Missing dentition Patient Positioning: Upright in bed  Oral Cavity - Oral Hygiene     Dysphagia Treatment Treatment focused on: Skilled observation of diet tolerance;Upgraded PO texture trials;Patient/family/caregiver education;Utilization of compensatory strategies Treatment Methods/Modalities: Skilled observation Patient observed directly with PO's: Yes Type of PO's observed: Thin liquids Feeding: Able to feed self Liquids provided via: Cup;Straw Pharyngeal Phase Signs & Symptoms: Immediate cough Type of cueing: Verbal Amount of cueing: Minimal   GO     Donna [name], Donna [name] 09/02/2012, 3:23 PM

## 2012-09-02 NOTE — Progress Notes (Signed)
NUTRITION FOLLOW UP  Intervention:   1. Change Ensure Pudding to Ensure Complete po BID, each supplement provides 350 kcal and 13 grams of protein, to promote increased kcal provision with variable po intake. 2. RD to continue to follow nutrition care plan  New Nutrition Dx: Swallowing difficulty r/t recent intubation and cognitive deficits AEB MBSS results and need for ST. Ongoing.  Goal:  Intake to meet >90% of estimated nutrition needs, met.   Monitor:  PO intake, weight trend, labs, I/O  Assessment:   Continues on Dysphagia 3 diet, advanced to thin liquids on 12/19. Sitter at bedside reports that pt consumed a little over 50% of her meal. Pt attempting to feed self is improving.  Height: Ht Readings from Last 1 Encounters:  09/01/12 5' 0.24" (1.53 m)    Weight Status:  stable Wt Readings from Last 1 Encounters:  09/01/12 126 lb 4.8 oz (57.289 kg)   Re-estimated needs:  Kcal: 1550-1750 Protein: 75-90 gm Fluid: 1.5-1.7 L  Skin: no issues noted  Diet Order: Dysphagia 3 with thin liquids   Intake/Output Summary (Last 24 hours) at 09/02/12 1042 Last data filed at 09/01/12 2100  Gross per 24 hour  Intake    360 ml  Output    800 ml  Net   -440 ml   Last BM: 12/21  Labs:   Lab 09/02/12 0559 09/01/12 0705 08/31/12 0605  NA 134* 138 139  K 3.6 3.6 3.7  CL 97 99 100  CO2 26 28 27   BUN 9 8 10   CREATININE 0.63 0.57 0.70  CALCIUM 9.0 9.2 9.0  MG -- -- --  PHOS -- -- --  GLUCOSE 93 97 99   CBG (last 3)  No results found for this basename: GLUCAP:3 in the last 72 hours  Scheduled Meds:    . ALPRAZolam  1 mg Oral TID  . amitriptyline  25 mg Oral QHS  . aspirin  325 mg Oral Daily  . carBAMazepine  400 mg Per Tube TID  . celecoxib  100 mg Oral BID  . DULoxetine  30 mg Oral BID  . feeding supplement  1 Container Oral TID BM  . folic acid  1 mg Oral Daily  . gabapentin  100 mg Oral TID  . heparin subcutaneous  5,000 Units Subcutaneous Q8H  . levETIRAcetam   1,000 mg Oral BID  . lisinopril  10 mg Oral Daily  . pantoprazole  40 mg Oral BID AC  . thiamine  100 mg Per Tube TID   Continuous Infusions:   Jarold Motto MS, RD, LDN Pager: [phone] After-hours pager: [phone]

## 2012-09-02 NOTE — Progress Notes (Signed)
Physical Therapy Treatment Patient Details Name: Donna [name] MRN: [id number] DOB: 01/10/1956 Today's Date: 09/02/2012 Time: 4098-1191 PT Time Calculation (min): 13 min  PT Assessment / Plan / Recommendation Comments on Treatment Session  Noted increased difficulty with RUE/RLE today with decreased balance and c/o dizziness.  Notified RN.  Continues to have significant cognitive deficits with safety/judgement.    Follow Up Recommendations  SNF     Does the patient have the potential to tolerate intense rehabilitation     Barriers to Discharge        Equipment Recommendations  Rolling walker with 5" wheels    Recommendations for Other Services    Frequency Min 3X/week   Plan Discharge plan remains appropriate;Frequency remains appropriate    Precautions / Restrictions Precautions Precautions: Fall Precaution Comments: Very impulsive with decreased judgement Restrictions Weight Bearing Restrictions: No   Pertinent Vitals/Pain     Mobility  Bed Mobility Bed Mobility: Supine to Sit Supine to Sit: 3: Mod assist Details for Bed Mobility Assistance: Verbal cues and assist to come to sitting.  Patient unable to get RLE out from under covers.  Patient c/o dizziness today. Transfers Transfers: Sit to Stand;Stand to Sit Sit to Stand: 3: Mod assist;With upper extremity assist;From bed Stand to Sit: 3: Mod assist;To chair/3-in-1 Stand Pivot Transfers: 3: Mod assist Details for Transfer Assistance: Patient with decreased balance in sitting and standing today, requiring mod assist to maintain upright position.  Attempted to take steps and patient falling over to right side.  Returned to sitting and worked on sitting balance x 5 minutes.  Patient c/o dizziness continuing.  Transferred to recliner with mod assist. Ambulation/Gait Ambulation/Gait Assistance: Not tested (comment)      PT Goals Acute Rehab PT Goals PT Goal: Supine/Side to Sit - Progress: Progressing toward goal PT  Goal: Sit to Stand - Progress: Progressing toward goal PT Goal: Stand to Sit - Progress: Progressing toward goal PT Transfer Goal: Bed to Chair/Chair to Bed - Progress: Progressing toward goal  Visit Information  Last PT Received On: 09/02/12 Assistance Needed: +1    Subjective Data  Subjective: "I feel dizzy"   Cognition  Overall Cognitive Status: Impaired Area of Impairment: Attention;Following commands;Safety/judgement;Awareness of deficits;Problem solving Arousal/Alertness: Awake/alert Orientation Level: Disoriented to;Place;Time;Situation Behavior During Session: Restless Current Attention Level: Focused Following Commands: Follows one step commands inconsistently;Follows one step commands with increased time Safety/Judgement: Impulsive;Decreased safety judgement for tasks assessed;Decreased awareness of need for assistance Safety/Judgement - Other Comments: Very impulsive and inattention to rt side Awareness of Deficits: Patient with difficulty telling PT why she is in hospital Problem Solving: slow to process Cognition - Other Comments: Had difficulty locating RUE.    Balance  Balance Balance Assessed: Yes Static Sitting Balance Static Sitting - Balance Support: Left upper extremity supported;Feet supported Static Sitting - Level of Assistance: 4: Min assist Static Sitting - Comment/# of Minutes: Patient sat on EOB x 5 minutes.  Required assist to maintain upright posture.  Unable to use RUE to assist (difficulty locating RUE today).  End of Session PT - End of Session Equipment Utilized During Treatment: Gait belt Activity Tolerance: Patient limited by fatigue Patient left: in chair;with call bell/phone within reach;with nursing in room (sitter in room) Nurse Communication: Mobility status   GP     Vena Austria 09/02/2012, 1:38 PM Durenda Hurt. Renaldo Fiddler, [facility] Pager [phone]

## 2012-09-02 NOTE — Progress Notes (Addendum)
[facility] PROGRESS NOTE  Donna [name]:[id number] DOB: 1956-06-07 DOA: 08/20/2012 PCP: Louie Boston, MD  Brief narrative:  56 year old female patient was found unresponsive by her neighbors. O2 sat was 76% in route to the hospital despite EMS placing patient on CPAP. She has a history of polysubstance abuse including cocaine and noncompliance. Upon arrival to the emergency department at [facility], the patient was quite hypoxic and began having a seizure and was immediately intubated by the emergency room physician. Subsequent EKG revealed anterior ST segment elevation. Cardiologist on call reviewed the EKG. Because of patient's history of prior normal cardiac catheterization in 2011 and other explanatory factors for patient's abnormal EKG and altered mental status, severe hypoxia and seizure and fevers it was determined code STEMI would not be activated. Because of need for ICU admission and patient subsequently developed recurrent seizures consistent with status epilepticus pulmonary critical care medicine except that the patient in transfer [facility]. In the interim she was started on a Versed drip. Because of the EKG changes she was also started on IV heparin. She was also given Ativan to help suppress her seizures and was loaded with Keppra.  Additional history was obtained after admission confirming that patient had been found unresponsive with an empty oxycodone bottle next to her. Her urine drug screen was positive for cocaine after arrival. There were concerns that patient may have aspiration pneumonitis so she was briefly given antibiotics. CT of the head was negative despite no dictation of right-sided weakness which apparently is chronic. Patient eventually self extubated on 08/24/2012. She was subsequently transferred out of the ICU on 08/26/2012 [facility].    Assessment/Plan:  Principle problem:  STEMI (ST elevation myocardial infarction) due to acute cocaine use  -not  felt a candidate for acute cardiac catheterization . -previous catheterization June 2011 revealed no coronary artery disease  -Troponin peaked at 72 and has trended downward- initial EKG had ST elevation in anterior leads.  - likely MI precipitated by concomitant use of cocaine with associated overdose and hypoxemia  -patient has encephalopathy with significant word finding difficulty and cardiology recommend for non invasive treatment for now.  -Because of cocaine use cannot use beta blocker. Added lisinopril.  -Echocardiogram this admission shows no new regional wall motion abnormalities and demonstrates persistent apical akinesis -cards to follow up for non invasive testing prior to d/c  Active Problems:  Acute encephalopathy  *Acute encephalopathy likely from the above-stated metabolic issues  Patent was admitted at [facility] in September with diagnosis of wernicke's encephalopathy. An MRI obtained at that time was suggestive Posterior reversible encephalopathy syndrome( PRES). apparently she got better on discharge.  -repeat MRI showed Improved gray-white matter signal in the brain compared to previous study. . Residual signal abnormality in the left thalamus suggests possible seizure vs ischemia.  -Continue with AED for now. Seen by neurology who recommends to continue Keppra for now. -Continue thiamine and folate. ammonia level within normal limits -appreciate psych recs. No suicidal ideation. -[name] was discontinued however had to be placed back because of fall risk. -needs SNF on discharge however having fall risk and need of a sitter may prolong hospital stay. - now clinically improved-we'll DC sitter today and monitor closely. Social work to assist with placement Acute respiratory failure with hypoxia/ Aspiration pneumonitis  *Multifactorial secondary to narcotic overdose with resultant hypoxemia and altered mentation requiring intubation  *Likely also has some aspiration pneumonitis .  patient has completed a course of Unasyn  *Continue supportive care.  AKI  Likely prerenal with hypoperfusion. Now resolved   Thrombocytopenia  Resolved   Status epilepticus  Possibly substance induced seizure. No seizure activity since 48 hrs post admission  Cont keppra   Hep /Cirrhosis with alcoholism  *Cirrhotic changes seen on previous imaging but total bilirubin normal  Dysphagia  On dys level 3 diet   Hypokalemia  *Resolved   COPD (chronic obstructive pulmonary disease)  *Stable .Cont prn nebs   Hypernatremia  *Resolved with hydration  Probable GERDvs alcohol related gastritis -as above nauseous with epigastric tenderness on exam,continue PPI, on reglan low dose prncontinue antiemetics as needed.  Chronic diastolic congestive heart failure, NYHA class 1  stable   Constipation -Still no BM, continue dulcolax prn add enema when necessary and follow  N/V -constipation may be contributing-adding enema as needed -Improving with symptomatic management -UA and lipase negative  DVT prophylaxis  consults PCCM  cardiology  psych  Neurology   Antibiotics:  Levaquin 12/10 x 1  Vancomycin 12/10 x 1  Unasyn 12/10 >>> 12/14     HPI/Subjective:  -Feels better and this a.m., tolerating by mouth. Still no BM  Objective: Filed Vitals:   09/01/12 1900 09/01/12 2119 09/02/12 0410 09/02/12 0933  BP: 133/91 146/90 93/50 96/54   Pulse: 105 108 94 96  Temp: 98.4 F (36.9 C) 98.1 F (36.7 C) 98.2 F (36.8 C) 98.9 F (37.2 C)  TempSrc: Axillary Oral Oral Oral  Resp: 18 18 16 18   Height:  5' 0.24" (1.53 m)    Weight:  57.289 kg (126 lb 4.8 oz)    SpO2: 99% 92% 93% 99%    Intake/Output Summary (Last 24 hours) at 09/02/12 1144 Last data filed at 09/01/12 2100  Gross per 24 hour  Intake    360 ml  Output    800 ml  Net   -440 ml   Filed Weights   08/30/12 0325 08/31/12 2123 09/01/12 2119  Weight: 57.38 kg (126 lb 8 oz) 58.015 kg (127 lb 14.4 oz) 57.289 kg  (126 lb 4.8 oz)    Exam:  General: middle aged female In NAD.  HEENT: no pallor, moist oral mucosa  Cardiovascular: NS1 & S2, no murmurs  Respiratory: moderate air mov't, few rhonchi Abdomen: soft, nontender,ND, BS+  Ext: warm no edema  CNS: alert, answers simple questions appropriately, nonfocal  Data Reviewed: Basic Metabolic Panel:  Lab 09/02/12 1914 09/01/12 0705 08/31/12 0605 08/30/12 0500 08/29/12 0545  NA 134* 138 139 135 141  K 3.6 3.6 3.7 3.3* 3.4*  CL 97 99 100 96 107  CO2 26 28 27 25 26   GLUCOSE 93 97 99 119* 93  BUN 9 8 10  5* 8  CREATININE 0.63 0.57 0.70 0.46* 0.58  CALCIUM 9.0 9.2 9.0 9.2 8.3*  MG -- -- -- -- --  PHOS -- -- -- -- --   Liver Function Tests: No results found for this basename: AST:5,ALT:5,ALKPHOS:5,BILITOT:5,PROT:5,ALBUMIN:5 in the last 168 hours  Lab 09/01/12 2210  LIPASE 14  AMYLASE --    Lab 08/30/12 0505  AMMONIA 42   CBC:  Lab 09/02/12 0559 09/01/12 0705 08/31/12 0605 08/30/12 0500 08/29/12 0545  WBC 6.0 5.9 6.9 6.1 4.8  NEUTROABS -- -- -- -- --  HGB 14.4 15.2* 15.8* 15.5* 13.8  HCT 43.8 46.0 47.6* 47.1* 42.4  MCV 93.4 94.3 93.0 91.3 93.0  PLT 298 284 280 292 234   Cardiac Enzymes: No results found for this basename: CKTOTAL:5,CKMB:5,CKMBINDEX:5,TROPONINI:5 in the last 168 hours BNP (last  3 results)  Basename 04/23/12 0327 04/18/12 1924  PROBNP 19352.0* 16764.0*   CBG:  Lab 08/27/12 1133 08/27/12 0743 08/27/12 0320 08/26/12 2329 08/26/12 1945  GLUCAP 100* 110* 93 97 82    No results found for this or any previous visit (from the past 240 hour(s)).   Studies: No results found.  Scheduled Meds:    . ALPRAZolam  1 mg Oral TID  . amitriptyline  25 mg Oral QHS  . aspirin  325 mg Oral Daily  . carBAMazepine  400 mg Per Tube TID  . celecoxib  100 mg Oral BID  . DULoxetine  30 mg Oral BID  . feeding supplement  237 mL Oral BID BM  . folic acid  1 mg Oral Daily  . gabapentin  100 mg Oral TID  . heparin subcutaneous   5,000 Units Subcutaneous Q8H  . levETIRAcetam  1,000 mg Oral BID  . lisinopril  10 mg Oral Daily  . pantoprazole  40 mg Oral BID AC  . thiamine  100 mg Per Tube TID   Continuous Infusions:     Time spent: 30 minutes    Sekai Gitlin C  [facility] Pager [phone]. If 8PM-8AM, please contact night-coverage at [email or web address], password [facility] 09/02/2012, 11:44 AM  LOS: 13 days

## 2012-09-03 DIAGNOSIS — R339 Retention of urine, unspecified: Secondary | ICD-10-CM | POA: Diagnosis not present

## 2012-09-03 LAB — BASIC METABOLIC PANEL
CO2: 27 mEq/L (ref 19–32)
Chloride: 98 mEq/L (ref 96–112)
Glucose, Bld: 86 mg/dL (ref 70–99)
Potassium: 4.1 mEq/L (ref 3.5–5.1)
Sodium: 133 mEq/L — ABNORMAL LOW (ref 135–145)

## 2012-09-03 LAB — URINALYSIS, ROUTINE W REFLEX MICROSCOPIC
Glucose, UA: NEGATIVE mg/dL
Hgb urine dipstick: NEGATIVE
Specific Gravity, Urine: 1.011 (ref 1.005–1.030)
Urobilinogen, UA: 0.2 mg/dL (ref 0.0–1.0)
pH: 8 (ref 5.0–8.0)

## 2012-09-03 LAB — CBC
Hemoglobin: 13.3 g/dL (ref 12.0–15.0)
RBC: 4.33 MIL/uL (ref 3.87–5.11)

## 2012-09-03 LAB — GLUCOSE, CAPILLARY: Glucose-Capillary: 125 mg/dL — ABNORMAL HIGH (ref 70–99)

## 2012-09-03 MED ORDER — KETOROLAC TROMETHAMINE 30 MG/ML IJ SOLN
30.0000 mg | Freq: Once | INTRAMUSCULAR | Status: AC
  Administered 2012-09-03: 30 mg via INTRAVENOUS
  Filled 2012-09-03: qty 1

## 2012-09-03 MED ORDER — SODIUM CHLORIDE 0.9 % IV SOLN
INTRAVENOUS | Status: DC
  Administered 2012-09-03 – 2012-09-09 (×10): via INTRAVENOUS

## 2012-09-03 NOTE — Progress Notes (Addendum)
[facility] PROGRESS NOTE  Donna [name] ZOX:[id number] DOB: 1956-06-08 DOA: 08/20/2012 PCP: Louie Boston, MD  Brief narrative:  56 year old female patient was found unresponsive by her neighbors. O2 sat was 76% in route to the hospital despite EMS placing patient on CPAP. She has a history of polysubstance abuse including cocaine and noncompliance. Upon arrival to the emergency department at [facility], the patient was quite hypoxic and began having a seizure and was immediately intubated by the emergency room physician. Subsequent EKG revealed anterior ST segment elevation. Cardiologist on call reviewed the EKG. Because of patient's history of prior normal cardiac catheterization in 2011 and other explanatory factors for patient's abnormal EKG and altered mental status, severe hypoxia and seizure and fevers it was determined code STEMI would not be activated. Because of need for ICU admission and patient subsequently developed recurrent seizures consistent with status epilepticus pulmonary critical care medicine except that the patient in transfer at [facility]. In the interim she was started on a Versed drip. Because of the EKG changes she was also started on IV heparin. She was also given Ativan to help suppress her seizures and was loaded with Keppra.  Additional history was obtained after admission confirming that patient had been found unresponsive with an empty oxycodone bottle next to her. Her urine drug screen was positive for cocaine after arrival. There were concerns that patient may have aspiration pneumonitis so she was briefly given antibiotics. CT of the head was negative despite no dictation of right-sided weakness which apparently is chronic. Patient eventually self extubated on 08/24/2012. She was subsequently transferred out of the ICU on 08/26/2012 [facility].    Assessment/Plan:  Principle problem:  STEMI (ST elevation myocardial infarction) due to acute cocaine use  -not  felt a candidate for acute cardiac catheterization . -previous catheterization June 2011 revealed no coronary artery disease  -Troponin peaked at 72 and has trended downward- initial EKG had ST elevation in anterior leads.  - likely MI precipitated by concomitant use of cocaine with associated overdose and hypoxemia  -patient has encephalopathy with significant word finding difficulty and cardiology recommend for non invasive treatment for now.  -Because of cocaine use cannot use beta blocker. Added lisinopril.  -Echocardiogram this admission shows no new regional wall motion abnormalities and demonstrates persistent apical akinesis. -Plan on Myoview as inpatient.  Active Problems:  Acute encephalopathy  *Acute encephalopathy likely from the above-stated metabolic issues  Patent was admitted at [facility] in September with diagnosis of wernicke's encephalopathy. An MRI obtained at that time was suggestive Posterior reversible encephalopathy syndrome( PRES). apparently she got better on discharge.  -repeat MRI showed Improved gray-white matter signal in the brain compared to previous study. . Residual signal abnormality in the left thalamus suggests possible seizure vs ischemia.  -Continue with AED for now. Seen by neurology who recommends to continue Keppra for now.  -Continue thiamine and folate. ammonia level within normal limits  -appreciate psych recs. No suicidal ideation.  -[name] was discontinued however had to be placed back because of fall risk.  -needs SNF on discharge however having fall risk and need of a sitter may prolong hospital stay.  Social work to assist with placement    Urinary retention since 12/23 Noted for significant urinary retention on bladder scan requiring in and out catheterization. We'll check a UA and placing a Foley.  Noted to be mildly orthostatic today. Will order for IV hydration and monitor in the morning  Acute respiratory failure with hypoxia/ Aspiration  pneumonitis  *Multifactorial secondary to narcotic overdose with resultant hypoxemia and altered mentation requiring intubation  *Likely also has some aspiration pneumonitis . patient has completed a course of Unasyn  *Continue supportive care.   AKI  Likely prerenal with hypoperfusion. Now resolved   Thrombocytopenia  Resolved   Status epilepticus  Possibly substance induced seizure. No seizure activity since 48 hrs post admission  Cont keppra   Hep /Cirrhosis with alcoholism  *Cirrhotic changes seen on previous imaging but total bilirubin normal   Dysphagia  On dys level 3 diet   Hypokalemia  *Resolved   COPD (chronic obstructive pulmonary disease)  *Stable .Cont prn nebs   Hypernatremia  *Resolved with hydration    GERD vs alcohol related gastritis  -as above nauseous with epigastric tenderness on exam,continue PPI, on reglan low dose prncontinue antiemetics as needed.   Chronic diastolic congestive heart failure, NYHA class 1  stable   Constipation  - continue dulcolax prn add enema when necessary and follow   N/V  -constipation may be contributing-adding enema as needed  -Improving with symptomatic management     DVT prophylaxis   consults PCCM  cardiology  psych  Neurology   Antibiotics:  Levaquin 12/10 x 1  Vancomycin 12/10 x 1  Unasyn 12/10 >>> 12/14    HPI/Subjective: Patient again try to get out of bed and sustained a fall . did not have any  injury  Objective: Filed Vitals:   09/03/12 0915 09/03/12 0920 09/03/12 0925 09/03/12 1252  BP: 105/67 113/71 90/65 107/58  Pulse: 94 102 115 100  Temp: 98.6 F (37 C)   98.6 F (37 C)  TempSrc: Oral   Oral  Resp: 18   18  Height:      Weight:      SpO2: 94%   96%    Intake/Output Summary (Last 24 hours) at 09/03/12 1645 Last data filed at 09/03/12 1419  Gross per 24 hour  Intake    240 ml  Output   1200 ml  Net   -960 ml   Filed Weights   08/30/12 0325 08/31/12 2123 09/01/12 2119   Weight: 57.38 kg (126 lb 8 oz) 58.015 kg (127 lb 14.4 oz) 57.289 kg (126 lb 4.8 oz)    Exam:  General: middle aged female In NAD.  HEENT: no pallor, moist oral mucosa  Cardiovascular: NS1 & S2, no murmurs  Respiratory: moderate air mov't, few rhonchi  Abdomen: soft, nontender,ND, BS+  Ext: warm no edema  CNS: alert, still quite confused on answering questions,  nonfocal   Data Reviewed: Basic Metabolic Panel:  Lab 09/03/12 1610 09/02/12 0559 09/01/12 0705 08/31/12 0605 08/30/12 0500  NA 133* 134* 138 139 135  K 4.1 3.6 3.6 3.7 3.3*  CL 98 97 99 100 96  CO2 27 26 28 27 25   GLUCOSE 86 93 97 99 119*  BUN 10 9 8 10  5*  CREATININE 0.66 0.63 0.57 0.70 0.46*  CALCIUM 9.2 9.0 9.2 9.0 9.2  MG -- -- -- -- --  PHOS -- -- -- -- --   Liver Function Tests: No results found for this basename: AST:5,ALT:5,ALKPHOS:5,BILITOT:5,PROT:5,ALBUMIN:5 in the last 168 hours  Lab 09/01/12 2210  LIPASE 14  AMYLASE --    Lab 08/30/12 0505  AMMONIA 42   CBC:  Lab 09/03/12 0500 09/02/12 0559 09/01/12 0705 08/31/12 0605 08/30/12 0500  WBC 4.5 6.0 5.9 6.9 6.1  NEUTROABS -- -- -- -- --  HGB 13.3 14.4 15.2* 15.8* 15.5*  HCT 40.3 43.8 46.0 47.6* 47.1*  MCV 93.1 93.4 94.3 93.0 91.3  PLT 279 298 284 280 292   Cardiac Enzymes: No results found for this basename: CKTOTAL:5,CKMB:5,CKMBINDEX:5,TROPONINI:5 in the last 168 hours BNP (last 3 results)  Basename 04/23/12 0327 04/18/12 1924  PROBNP 19352.0* 16764.0*   CBG: No results found for this basename: GLUCAP:5 in the last 168 hours  No results found for this or any previous visit (from the past 240 hour(s)).   Studies: No results found.  Scheduled Meds:   . ALPRAZolam  1 mg Oral TID  . amitriptyline  25 mg Oral QHS  . aspirin  325 mg Oral Daily  . carBAMazepine  400 mg Per Tube TID  . celecoxib  100 mg Oral BID  . DULoxetine  30 mg Oral BID  . feeding supplement  237 mL Oral BID BM  . folic acid  1 mg Oral Daily  . gabapentin  100  mg Oral TID  . heparin subcutaneous  5,000 Units Subcutaneous Q8H  . levETIRAcetam  1,000 mg Oral BID  . lisinopril  10 mg Oral Daily  . pantoprazole  40 mg Oral BID AC  . thiamine  100 mg Per Tube TID   Continuous Infusions:   . sodium chloride        Time spent: 25 minutes    Tyauna Lacaze  [facility] Pager [phone] If 8PM-8AM, please contact night-coverage at [email or web address], password [facility] 09/03/2012, 4:45 PM  LOS: 14 days

## 2012-09-03 NOTE — Progress Notes (Signed)
Pt just had an episode where she complained of not being able to see, she became hot and sweaty all over, extremely fidgety, and would not answer questions appropriately. Vital signs stable. Blood sugar WNL. Pupils are equal and reactive, but her eyes do appear shaky. No complaints of chest pain, or headache. Right side is weak, but this is not new. Pt did receive medications approximately 1 hour prior to this episode. Pt also had family visiting when this episode occurred. Rapid response called to examine the pt. MD notified of this change. Will continue to monitor.

## 2012-09-03 NOTE — Progress Notes (Signed)
Bladder scanner showed 667 ml. In and out cath output = . During the in and out catheterization, pressure was applied by hand directly on abdomen to receive urine output.

## 2012-09-03 NOTE — Clinical Social Work Note (Signed)
CSW continuing to monitor patient progress. Patient high fall risk and still requiring a sitter. CSW will follow-up with family regarding other options in order to d/c patient to SNF.  Genelle Bal, MSW, LCSW [phone]

## 2012-09-03 NOTE — Progress Notes (Signed)
Pt was getting out of chair, back to bed, with sitter. Sitter was helping her get up, when she got weak, and sat down on the floor. VSS. Plan to check a set of orthostatic vital signs ASAP. Pt is currently resting in the bed with bed alarm on. Yellow armband, and red socks on. No complaints of pain. Will continue to monitor. MD made aware. Left message on answering machine with son to call back to discuss Myoview (need verbal consent), and fall.

## 2012-09-03 NOTE — Progress Notes (Signed)
Called for myoview order since consent was able to be obtained from son. Myoview not able to be done tomorrow. Will tentatively order for 09/05/12. If rounding MD does not feel this study is warranted when rounding tomorrow, the order will need to be cancelled. Cynethia Schindler PA-C

## 2012-09-03 NOTE — Progress Notes (Signed)
Physical Therapy Treatment Patient Details Name: Donna [name] MRN: [id number] DOB: 05/22/1956 Today's Date: 09/03/2012 Time: 4098-1191 PT Time Calculation (min): 21 min  PT Assessment / Plan / Recommendation Comments on Treatment Session  pt presents with STEMI, R sided weakness and and R sided Dyskinetic movments.  Spoke with RN about R sided deficits and RN notes these have been going on since admit, however on previous therapy notes pt had much better performance with mobility.  Will continue to follow.      Follow Up Recommendations  SNF     Does the patient have the potential to tolerate intense rehabilitation     Barriers to Discharge        Equipment Recommendations  Rolling walker with 5" wheels    Recommendations for Other Services    Frequency Min 3X/week   Plan Discharge plan remains appropriate;Frequency remains appropriate    Precautions / Restrictions Precautions Precautions: Fall Precaution Comments: Very impulsive with decreased judgement Restrictions Weight Bearing Restrictions: No   Pertinent Vitals/Pain Denies pain.      Mobility  Bed Mobility Bed Mobility: Supine to Sit;Sitting - Scoot to Edge of Bed Supine to Sit: 5: Supervision;HOB flat Sitting - Scoot to Edge of Bed: 5: Supervision Details for Bed Mobility Assistance: pt sat up in long sitting when pt mentioned OOB.   Transfers Transfers: Sit to Stand;Stand to Sit Sit to Stand: 4: Min assist;With upper extremity assist;From bed Stand to Sit: 4: Min assist;With upper extremity assist;To chair/3-in-1;With armrests Details for Transfer Assistance: pt with dyskinetic movements of R UE and LE with decreased attention to R side.  Cues to slow down and attempts to attend to R side.   Ambulation/Gait Ambulation/Gait Assistance: 3: Mod assist Ambulation Distance (Feet): 15 Feet Assistive device: 1 person hand held assist Ambulation/Gait Assistance Details: 1 person A on R side with max cueing for  attending to R side and movements of R LE.  Dyskinetic movements on R LE during gait with pt often overshooting R step length.   Gait Pattern: Step-through pattern (R LE Dyskinetic with varying BOS and step length.) Stairs: No Wheelchair Mobility Wheelchair Mobility: No Modified Rankin (Stroke Patients Only) Pre-Morbid Rankin Score:  (Unknown.  ) Modified Rankin: Moderately severe disability    Exercises     PT Diagnosis:    PT Problem List:   PT Treatment Interventions:     PT Goals Acute Rehab PT Goals PT Goal Formulation: Patient unable to participate in goal setting Time For Goal Achievement: 09/17/12 Potential to Achieve Goals: Fair Pt will go Supine/Side to Sit: with modified independence PT Goal: Supine/Side to Sit - Progress: Goal set today Pt will go Sit to Supine/Side: with modified independence PT Goal: Sit to Supine/Side - Progress: Goal set today Pt will go Sit to Stand: with supervision PT Goal: Sit to Stand - Progress: Goal set today Pt will go Stand to Sit: with supervision PT Goal: Stand to Sit - Progress: Goal set today Pt will Transfer Bed to Chair/Chair to Bed: with supervision PT Transfer Goal: Bed to Chair/Chair to Bed - Progress: Goal set today Pt will Stand: with supervision;3 - 5 min;with unilateral upper extremity support PT Goal: Stand - Progress: Goal set today Pt will Ambulate: 51 - 150 feet;with supervision;with least restrictive assistive device PT Goal: Ambulate - Progress: Goal set today  Visit Information  Last PT Received On: 09/03/12 Assistance Needed: +1    Subjective Data  Subjective: I feel ok today.  Cognition  Overall Cognitive Status: Impaired Area of Impairment: Attention;Following commands;Safety/judgement;Awareness of deficits;Problem solving Arousal/Alertness: Awake/alert Orientation Level: Disoriented to;Place;Time;Situation Behavior During Session: Restless Current Attention Level: Focused Following Commands: Follows  one step commands inconsistently;Follows one step commands with increased time Safety/Judgement: Impulsive;Decreased safety judgement for tasks assessed;Decreased awareness of need for assistance Safety/Judgement - Other Comments: Very impulsive and inattention to rt side Awareness of Deficits: Patient with difficulty telling PT why she is in hospital Problem Solving: slow to process Cognition - Other Comments: Had difficulty locating RUE.    Balance  Balance Balance Assessed: Yes Static Standing Balance Static Standing - Balance Support: Right upper extremity supported Static Standing - Level of Assistance: 4: Min assist Static Standing - Comment/# of Minutes: pt able to maintain balance with only MinA and cueing to attend to task.    End of Session PT - End of Session Equipment Utilized During Treatment: Gait belt Activity Tolerance: Patient tolerated treatment well Patient left: in chair (Sitter in room and holding call button for pt.  ) Nurse Communication: Mobility status   GP     Sunny Schlein, PT [phone] 09/03/2012, 9:10 AM

## 2012-09-03 NOTE — Progress Notes (Signed)
MD notified of the following: After pt sustained a fall this am, her orthostatic vital signs were taken, and her blood pressure did drop to 90/65 upon standing. Orders received to increase IV fluids.  Pt has been having difficulty urinating. We have been performing bladder scans every few hours, and performing in and out caths as needed. Pt says she feels like she needs to go, but is unable to void. Orders received to insert foley.   Pt has also been having increasing weakness to the right side. PT/OT are both working with pt, and a CT and MRI of head have both been completed with no acute findings. Will continue to monitor.

## 2012-09-03 NOTE — Progress Notes (Signed)
Patient is unsteady on feet, impulsive, has poor judgement and knowledge of surroundings. Sitter is at bedside. Will continue to monitor.

## 2012-09-04 LAB — CBC
HCT: 42.3 % (ref 36.0–46.0)
MCV: 94 fL (ref 78.0–100.0)
Platelets: 225 10*3/uL (ref 150–400)
RBC: 4.5 MIL/uL (ref 3.87–5.11)
WBC: 4.1 10*3/uL (ref 4.0–10.5)

## 2012-09-04 LAB — BASIC METABOLIC PANEL
CO2: 28 mEq/L (ref 19–32)
Chloride: 102 mEq/L (ref 96–112)
Creatinine, Ser: 0.64 mg/dL (ref 0.50–1.10)

## 2012-09-04 NOTE — Progress Notes (Signed)
Patient ID: Donna [name], female   DOB: 11/01/1955, 56 y.o.   MRN: [id number]   I have reviewed prior cardiology notes. It is appropriate to proceed with a Myoview scan tomorrow. This was ordered December 24 and is to be done December 26.  Jerral Bonito, MD

## 2012-09-04 NOTE — Progress Notes (Signed)
[facility] PROGRESS NOTE  Donna [name] WUJ:[id number] DOB: 11/03/1955 DOA: 08/20/2012 PCP: Donna Boston, MD  Brief narrative:  56 year old female patient was found unresponsive by her neighbors. O2 sat was 76% in route to the hospital despite EMS placing patient on CPAP. She has a history of polysubstance abuse including cocaine and noncompliance. Upon arrival to the emergency department at [facility], the patient was quite hypoxic and began having a seizure and was immediately intubated by the emergency room physician. Subsequent EKG revealed anterior ST segment elevation. Cardiologist on call reviewed the EKG. Because of patient's history of prior normal cardiac catheterization in 2011 and other explanatory factors for patient's abnormal EKG and altered mental status, severe hypoxia and seizure and fevers it was determined code STEMI would not be activated. Because of need for ICU admission and patient subsequently developed recurrent seizures consistent with status epilepticus pulmonary critical care medicine except that the patient in transfer at [facility]. In the interim she was started on a Versed drip. Because of the EKG changes she was also started on IV heparin. She was also given Ativan to help suppress her seizures and was loaded with Keppra.  Additional history was obtained after admission confirming that patient had been found unresponsive with an empty oxycodone bottle next to her. Her urine drug screen was positive for cocaine after arrival. There were concerns that patient may have aspiration pneumonitis so she was briefly given antibiotics. CT of the head was negative despite no dictation of right-sided weakness which apparently is chronic. Patient eventually self extubated on 08/24/2012. She was subsequently transferred out of the ICU on 08/26/2012 [facility]     Assessment/Plan:  Principle problem:  STEMI (ST elevation myocardial infarction) due to acute cocaine use  -not  felt a candidate for acute cardiac catheterization . -previous catheterization June 2011 revealed no coronary artery disease  -Troponin peaked at 93 and has trended downward- initial EKG had ST elevation in anterior leads.  - likely MI precipitated by concomitant use of cocaine with associated overdose and hypoxemia  -patient has encephalopathy with significant word finding difficulty and cardiology recommend for non invasive treatment for now.  -Because of cocaine use cannot use beta blocker. Added lisinopril.  -Echocardiogram this admission shows no new regional wall motion abnormalities and demonstrates persistent apical akinesis.  -Plan on Myoview on 12/26  Active Problems:  Acute encephalopathy  *Acute encephalopathy likely from the above-stated metabolic issues  Patent was admitted at [facility] in September with diagnosis of wernicke's encephalopathy. An MRI obtained at that time was suggestive Posterior reversible encephalopathy syndrome( PRES). apparently she got better on discharge.  -repeat MRI showed Improved gray-white matter signal in the brain compared to previous study. . Residual signal abnormality in the left thalamus suggests possible seizure vs ischemia.  -Continue with AED for now. Seen by neurology who recommends to continue Keppra for now.  -Continue thiamine and folate. ammonia level within normal limits  -appreciate psych recs. No suicidal ideation.  -Donna [name] was discontinued however had to be placed back because of fall risk.  -needs SNF on discharge however having fall risk and need of a sitter may prolong hospital stay.  Social work to assist with placement  -patient today appears more alert and communicative. She does have confusion with word finding difficulty and memory lapses. Was talking normally with her sister over the phone today.   Urinary retention since 12/23  Noted for significant urinary retention on bladder scan requiring in and out catheterization. Placed  a  foley. UA unremarkable. Noted to be mildly orthostatic as well. continue  IV hydration and monitor in the morning   Acute respiratory failure with hypoxia/ Aspiration pneumonitis  *Multifactorial secondary to narcotic overdose with resultant hypoxemia and altered mentation requiring intubation  *Likely also has some aspiration pneumonitis . patient has completed a course of Unasyn   AKI  Likely prerenal with hypoperfusion. Now resolved   Thrombocytopenia  Resolved   Status epilepticus  Possibly substance induced seizure. No seizure activity since 48 hrs post admission  Cont keppra   Hep /Cirrhosis with alcoholism  *Cirrhotic changes seen on previous imaging but total bilirubin normal   Dysphagia  On dys level 3 diet   Hypokalemia  *Resolved   COPD (chronic obstructive pulmonary disease)  *Stable .Cont prn nebs   Hypernatremia  *Resolved with hydration   GERD vs alcohol related gastritis  -as above. nauseous with epigastric tenderness on exam,continue PPI, on reglan low dose prncontinue antiemetics as needed.   Chronic diastolic congestive heart failure, NYHA class 1  stable      DVT prophylaxis   consults PCCM  cardiology  psych  Neurology   Antibiotics:  Levaquin 12/10 x 1  Vancomycin 12/10 x 1  Unasyn 12/10 >>> 12/14    HPI/Subjective: Hurt he was called yesterday as patient thought to be more altered. Patient seen at bedside and did not appear to have much changed, neurological status. No further workup done.  Objective: Filed Vitals:   09/03/12 1700 09/03/12 1900 09/03/12 2052 09/04/12 0536  BP: 128/81  110/65 94/62  Pulse: 93  89 95  Temp: 97.6 F (36.4 C) 97.5 F (36.4 C) 97.5 F (36.4 C) 97.9 F (36.6 C)  TempSrc: Oral  Oral Oral  Resp: 22  20 20   Height:      Weight:      SpO2: 99%  95% 96%    Intake/Output Summary (Last 24 hours) at 09/04/12 1000 Last data filed at 09/04/12 0800  Gross per 24 hour  Intake 821.67 ml  Output   1600  ml  Net -778.33 ml   Filed Weights   08/30/12 0325 08/31/12 2123 09/01/12 2119  Weight: 57.38 kg (126 lb 8 oz) 58.015 kg (127 lb 14.4 oz) 57.289 kg (126 lb 4.8 oz)    Exam:  General: middle aged female In NAD.  HEENT: no pallor, moist oral mucosa  Cardiovascular: NS1 & S2, no murmurs  Respiratory: Clear to auscultation bilaterally Abdomen: soft, nontender,ND, BS+  Ext: warm no edema  CNS: alert, still quite confused on answering questions, she is more conversant and communicative today.   Data Reviewed: Basic Metabolic Panel:  Lab 09/04/12 1610 09/03/12 0500 09/02/12 0559 09/01/12 0705 08/31/12 0605  NA 138 133* 134* 138 139  K 4.0 4.1 3.6 3.6 3.7  CL 102 98 97 99 100  CO2 28 27 26 28 27   GLUCOSE 91 86 93 97 99  BUN 15 10 9 8 10   CREATININE 0.64 0.66 0.63 0.57 0.70  CALCIUM 9.2 9.2 9.0 9.2 9.0  MG -- -- -- -- --  PHOS -- -- -- -- --   Liver Function Tests: No results found for this basename: AST:5,ALT:5,ALKPHOS:5,BILITOT:5,PROT:5,ALBUMIN:5 in the last 168 hours  Lab 09/01/12 2210  LIPASE 14  AMYLASE --    Lab 08/30/12 0505  AMMONIA 42   CBC:  Lab 09/04/12 0510 09/03/12 0500 09/02/12 0559 09/01/12 0705 08/31/12 0605  WBC 4.1 4.5 6.0 5.9 6.9  NEUTROABS -- -- -- -- --  HGB 13.7 13.3 14.4 15.2* 15.8*  HCT 42.3 40.3 43.8 46.0 47.6*  MCV 94.0 93.1 93.4 94.3 93.0  PLT 225 279 298 284 280   Cardiac Enzymes: No results found for this basename: CKTOTAL:5,CKMB:5,CKMBINDEX:5,TROPONINI:5 in the last 168 hours BNP (last 3 results)  Basename 04/23/12 0327 04/18/12 1924  PROBNP 19352.0* 16764.0*   CBG:  Lab 09/03/12 1722  GLUCAP 125*    No results found for this or any previous visit (from the past 240 hour(s)).   Studies: No results found.  Scheduled Meds:   . ALPRAZolam  1 mg Oral TID  . amitriptyline  25 mg Oral QHS  . aspirin  325 mg Oral Daily  . carBAMazepine  400 mg Per Tube TID  . celecoxib  100 mg Oral BID  . DULoxetine  30 mg Oral BID  .  feeding supplement  237 mL Oral BID BM  . folic acid  1 mg Oral Daily  . gabapentin  100 mg Oral TID  . heparin subcutaneous  5,000 Units Subcutaneous Q8H  . levETIRAcetam  1,000 mg Oral BID  . lisinopril  10 mg Oral Daily  . pantoprazole  40 mg Oral BID AC  . thiamine  100 mg Per Tube TID   Continuous Infusions:   . sodium chloride 100 mL/hr at 09/04/12 0519      Time spent: 25 minutes    Wyndham Santilli  [facility] Pager [phone]. If 8PM-8AM, please contact night-coverage at [email or web address], password [facility] 09/04/2012, 10:00 AM  LOS: 15 days

## 2012-09-05 ENCOUNTER — Inpatient Hospital Stay (HOSPITAL_COMMUNITY): Payer: Medicare Other

## 2012-09-05 DIAGNOSIS — R079 Chest pain, unspecified: Secondary | ICD-10-CM

## 2012-09-05 MED ORDER — TECHNETIUM TC 99M SESTAMIBI GENERIC - CARDIOLITE
30.0000 | Freq: Once | INTRAVENOUS | Status: AC | PRN
  Administered 2012-09-05: 30 via INTRAVENOUS

## 2012-09-05 MED ORDER — REGADENOSON 0.4 MG/5ML IV SOLN
INTRAVENOUS | Status: AC
  Filled 2012-09-05: qty 5

## 2012-09-05 MED ORDER — TECHNETIUM TC 99M SESTAMIBI GENERIC - CARDIOLITE
10.0000 | Freq: Once | INTRAVENOUS | Status: AC | PRN
  Administered 2012-09-05: 10 via INTRAVENOUS

## 2012-09-05 MED ORDER — ALUM & MAG HYDROXIDE-SIMETH 200-200-20 MG/5ML PO SUSP
15.0000 mL | Freq: Four times a day (QID) | ORAL | Status: DC | PRN
Start: 2012-09-05 — End: 2012-09-10
  Filled 2012-09-05: qty 30

## 2012-09-05 MED ORDER — REGADENOSON 0.4 MG/5ML IV SOLN
0.4000 mg | Freq: Once | INTRAVENOUS | Status: AC
  Administered 2012-09-05: 0.4 mg via INTRAVENOUS
  Filled 2012-09-05: qty 5

## 2012-09-05 NOTE — Progress Notes (Signed)
myoview negative  Will sign off

## 2012-09-05 NOTE — Progress Notes (Signed)
PT/OT Cancellation Note  Patient Details Name: Donna [name] MRN: [id number] DOB: 09-23-1955   Cancelled Treatment:    Reason Eval/Treat Not Completed: Patient at procedure or test/unavailable.  Pt getting Myoview, will f/u another time.     Sunny Schlein, [place] [phone] 09/05/2012, 7:48 AM

## 2012-09-05 NOTE — Progress Notes (Signed)
Patient: Donna [name] / Admit Date: 08/20/2012 / Date of Encounter: 09/05/2012, 9:59 AM   Subjective  Still somewhat confused. Initially denied CP or SOB then later said she might have SOB. Son signed consent.   Objective   Telemetry: unable to review as pt seen down in stress test lab Physical Exam: Filed Vitals:   09/05/12 0529  BP: 99/59  Pulse: 84  Temp: 97.6 F (36.4 C)  Resp: 19   General: Well developed thin WF in no acute distress. Head: Normocephalic, atraumatic, sclera non-icteric, no xanthomas, nares are without discharge. Poor dentition. Neck:  JVD not elevated. Lungs: Coarse but clear bilaterally to auscultation without wheezes, rales, or rhonchi. Breathing is unlabored. Heart: RRR S1 S2 without murmurs, rubs, or gallops.  Abdomen: Soft, non-tender, non-distended with normoactive bowel sounds. No hepatomegaly. No rebound/guarding. No obvious abdominal masses. Msk:  Strength and tone appear normal for age. Extremities: No clubbing or cyanosis. No edema.  Distal pedal pulses are 2+ and equal bilaterally. Neuro: Alert and oriented to person only. Gives confounding answers that don't always make sense - i.e. To the date, season, where she is at. Moves all extremities spontaneously and follows commands. Strength appear in tact. Psych:  Pleasant affect but does not seem to quite comprehend or answer all questions correctly.    Intake/Output Summary (Last 24 hours) at 09/05/12 0959 Last data filed at 09/05/12 1610  Gross per 24 hour  Intake   4820 ml  Output   1575 ml  Net   3245 ml    Inpatient Medications:    . ALPRAZolam  1 mg Oral TID  . amitriptyline  25 mg Oral QHS  . aspirin  325 mg Oral Daily  . carBAMazepine  400 mg Per Tube TID  . celecoxib  100 mg Oral BID  . DULoxetine  30 mg Oral BID  . feeding supplement  237 mL Oral BID BM  . folic acid  1 mg Oral Daily  . gabapentin  100 mg Oral TID  . heparin subcutaneous  5,000 Units Subcutaneous Q8H  .  levETIRAcetam  1,000 mg Oral BID  . lisinopril  10 mg Oral Daily  . pantoprazole  40 mg Oral BID AC  . regadenoson      . regadenoson  0.4 mg Intravenous Once  . thiamine  100 mg Per Tube TID    Labs:  Basename 09/04/12 0510 09/03/12 0500  NA 138 133*  K 4.0 4.1  CL 102 98  CO2 28 27  GLUCOSE 91 86  BUN 15 10  CREATININE 0.64 0.66  CALCIUM 9.2 9.2  MG -- --  PHOS -- --   No results found for this basename: AST:2,ALT:2,ALKPHOS:2,BILITOT:2,PROT:2,ALBUMIN:2 in the last 72 hours  Basename 09/04/12 0510 09/03/12 0500  WBC 4.1 4.5  NEUTROABS -- --  HGB 13.7 13.3  HCT 42.3 40.3  MCV 94.0 93.1  PLT 225 279   No results found for this basename: CKTOTAL:4,CKMB:4,TROPONINI:4 in the last 72 hours No components found with this basename: POCBNP:3 No results found for this basename: HGBA1C in the last 72 hours No results found for this basename: CHOL,HDL,LDLCALC,TRIG,CHOLHDL in the last 72 hours  Radiology/Studies:  Ct Head Wo Contrast 08/23/2012  *RADIOLOGY REPORT*  Clinical Data: Right-sided weakness.  Rule out CVA  CT HEAD WITHOUT CONTRAST  Technique:  Contiguous axial images were obtained from the base of the skull through the vertex without contrast.  Comparison: CT head 08/20/2012  Findings: Ventricle size is normal.  Negative for intracranial hemorrhage or mass.  Mild hypointensity in the sub insular white matter bilaterally is unchanged and has the appearance of chronic microvascular ischemia. No acute infarct identified.  Calvarium is intact.  IMPRESSION: No acute abnormality.   Original Report Authenticated By: Janeece Riggers, M.D.    Ct Head Wo Contrast 08/20/2012  *RADIOLOGY REPORT*  Clinical Data: Seizure  CT HEAD WITHOUT CONTRAST  Technique:  Contiguous axial images were obtained from the base of the skull through the vertex without contrast.  Comparison: 05/27/2012  Findings: Left density in the peripheral right frontal lobe on image 19 is felt to be related to signal averaging.   No mass effect, midline shift, or acute intracranial hemorrhage. Ventricles system is stable in appearance and size.  No new suspicious area of low density to suggest interval acute ischemic change.  Minimal chronic ischemic changes in the periventricular white matter.  Mastoid air cells and visualized paranasal sinuses are clear.  IMPRESSION: No acute intracranial pathology.   Original Report Authenticated By: Jolaine Click, M.D.    Mr Brain Wo Contrast 08/29/2012  *RADIOLOGY REPORT*  Clinical Data: 56 year old female found unresponsive on 08/20/2012, possible drug overdose. Seizure, controlled status epilepticus at time of admission.  Abnormal MRI in September.  MRI HEAD WITHOUT CONTRAST  Technique:  Multiplanar, multiecho pulse sequences of the brain and surrounding structures were obtained according to standard protocol without intravenous contrast.  Comparison: Head CTs 08/23/2012 and earlier.  Brain MRI 05/27/2012.  Findings: Diffusion signal appears to be abnormal in the left cingulate gyrus, no definite involvement on the right. More subtle abnormal asymmetric diffusion signal in the left superior frontal gyrus (series 3 image 20).  There is no associated T2 or FLAIR hyperintensity in these areas.  There is mildly abnormal diffusion signal in the medial left thalamus.  No other areas of restricted diffusion.  No convincing major vascular territory infarct.  Improved gray and white matter signal in the brain since September. Most of the abnormal signal in the periphery of the brain has resolved. No cortical encephalomalacia has developed.  There is some residual T2 and FLAIR hyperintensity in the medial left thalamus corresponding to the diffusion finding today.  Signal in the right thalamus has normalized.  Brainstem and cerebellum are within normal limits.  Major intracranial vascular flow voids are preserved.  No ventriculomegaly. No midline shift, mass effect, or evidence of mass lesion.  No acute  intracranial hemorrhage identified. Negative pituitary, cervicomedullary junction and visualized cervical spine.  Normal bone marrow signal.  Visualized orbit soft tissues are within normal limits.  Visualized paranasal sinuses and mastoids are clear.  Negative scalp soft tissues.  IMPRESSION: 1.  Improved gray-white matter signal in the brain compared to the abnormal September study.  Residual signal abnormality in the left thalamus. 2.  New low level abnormal diffusion signal in the left cingulate gyrus and some cortex at the anterior left superior frontal gyrus. 3.  The findings are nonspecific and the new signal abnormality might relate to recent seizure activity or mild ischemia.   A viral or atypical encephalitis is felt less likely given the fairly unilateral involvement.   Original Report Authenticated By: Erskine Speed, M.D.    Dg Chest Port 1 View 08/24/2012  *RADIOLOGY REPORT*  Clinical Data: Evaluate endotracheal tube, lines  PORTABLE CHEST - 1 VIEW  Comparison: Most recent prior chest x-ray 08/23/2012  Findings: The tip of the endotracheal tube is 5.4 cm above the carina.  Incompletely imaged nasogastric  tube appears similar to prior.  The tip lies below the diaphragm, likely within the stomach.  Given slight differences in technique, no significant interval change in the appearance of the lungs with bilateral interstitial and airspace opacities in the mid and lower lung zones.  Stable cardiomegaly.  IMPRESSION:  1.  Stable and satisfactory support apparatus. 2.  No significant interval change in the appearance of bilateral air space opacities and mild cardiomegaly.   Original Report Authenticated By: Malachy Moan, M.D.      Assessment and Plan  1. Oxyocodone overdose with history of polysubstance abuse including cocaine 2. Acute MI - initially called code STEMI but not a candidate for cath at time, so later deemed NQWMI  3. HTN 4. Acute respiratory failure 5. Acute encephalopathy 6. Status  epilepticus 7. COPD 8. Hepatitis C 9. Cirrhosis with alcoholism  Patient seen briefly in Myoview lab. Son signed consent. No acute EKG changes. Await images. MD to follow.  Signed, Ronie Spies PA-C

## 2012-09-05 NOTE — Progress Notes (Signed)
Pt foley catheter d/c'd per MD order. Pt tolerated well. No complaints of pain. Dondra Spry

## 2012-09-05 NOTE — Progress Notes (Signed)
[facility] PROGRESS NOTE  Donna [name] RUE:[id number] DOB: [birth year] DOA: 08/20/2012 PCP: Louie Boston, MD    Brief narrative:  56 year old female patient was found unresponsive by her neighbors. O2 sat was 76% in route to the hospital despite EMS placing patient on CPAP. She has a history of polysubstance abuse including cocaine and noncompliance. Upon arrival to the emergency department at [facility], the patient was quite hypoxic and began having a seizure and was immediately intubated by the emergency room physician. Subsequent EKG revealed anterior ST segment elevation. Cardiologist on call reviewed the EKG. Because of patient's history of prior normal cardiac catheterization in 2011 and other explanatory factors for patient's abnormal EKG and altered mental status, severe hypoxia and seizure and fevers it was determined code STEMI would not be activated. Because of need for ICU admission and patient subsequently developed recurrent seizures consistent with status epilepticus pulmonary critical care medicine except that the patient in transfer [facility]. In the interim she was started on a Versed drip. Because of the EKG changes she was also started on IV heparin. She was also given Ativan to help suppress her seizures and was loaded with Keppra.  Additional history was obtained after admission confirming that patient had been found unresponsive with an empty oxycodone bottle next to her. Her urine drug screen was positive for cocaine after arrival. There were concerns that patient may have aspiration pneumonitis so she was briefly given antibiotics. CT of the head was negative despite no dictation of right-sided weakness which apparently is chronic. Patient eventually self extubated on 08/24/2012. She was subsequently transferred out of the ICU on 08/26/2012 [facility].   Assessment/Plan:  Principle problem:  STEMI (ST elevation myocardial infarction) due to acute cocaine use  -not  felt a candidate for acute cardiac catheterization . -previous catheterization June 2011 revealed no coronary artery disease  -Troponin peaked at 56 and has trended downward- initial EKG had ST elevation in anterior leads.  - likely MI precipitated by concomitant use of cocaine with associated overdose and hypoxemia  -patient has encephalopathy with significant word finding difficulty and cardiology recommend for non invasive treatment for now.  -Because of cocaine use cannot use beta blocker. Added lisinopril.  -Echocardiogram this admission shows no new regional wall motion abnormalities and demonstrates persistent apical akinesis.  -lesiscan myoview done on 12/26, with no signs of ischemia . EF of 58%  Active Problems:  Acute encephalopathy  *Acute encephalopathy likely from the above-stated metabolic issues  Patent was admitted at [facility] in September with diagnosis of wernicke's encephalopathy. An MRI obtained at that time was suggestive Posterior reversible encephalopathy syndrome( PRES). apparently she got better on discharge.  -repeat MRI showed Improved gray-white matter signal in the brain compared to previous study. . Residual signal abnormality in the left thalamus suggests possible seizure vs ischemia.  - Seen by neurology who recommends to continue Keppra for now.  -Continue thiamine and folate.  -appreciate psych recs. No suicidal ideation.  -[name] was discontinued however had to be placed back because of fall risk.  -needs SNF on discharge however having fall risk and need of a sitter may prolong hospital stay.  Social work to assist with placement.  -patient  appears more alert and communicative.   Urinary retention since 12/23  Noted for significant urinary retention on bladder scan requiring in and out catheterization. Placed a foley. UA unremarkable.  -will d/c fluids and remove foley.  Acute respiratory failure with hypoxia/ Aspiration pneumonitis  *Multifactorial  secondary  to narcotic overdose with resultant hypoxemia and altered mentation requiring intubation  *Likely also has some aspiration pneumonitis . patient has completed a course of Unasyn   AKI  Likely prerenal with hypoperfusion. Now resolved   Thrombocytopenia  Resolved   Status epilepticus  Possibly substance induced seizure. No seizure activity since 48 hrs post admission  Cont keppra   Hep /Cirrhosis with alcoholism  *Cirrhotic changes seen on previous imaging but total bilirubin normal   Dysphagia  On dys level 3 diet   Hypokalemia  *Resolved   COPD (chronic obstructive pulmonary disease)  *Stable .Cont prn nebs   Hypernatremia  *Resolved with hydration   GERD v Cont PPI. Will add maalox  Chronic diastolic congestive heart failure, NYHA class 1  stable   DVT prophylaxis  consults PCCM  cardiology  psych  Neurology   Antibiotics:  Levaquin 12/10 x 1  Vancomycin 12/10 x 1  Unasyn 12/10 >>> 12/14     HPI/Subjective: Seen after stress test. C/o come abdominal discomfort. Patient much oriented today  Objective: Filed Vitals:   09/05/12 1012 09/05/12 1013 09/05/12 1413 09/05/12 1500  BP: 123/62 127/64 130/86 132/68  Pulse: 106 106 85 86  Temp:   97.4 F (36.3 C) 97.6 F (36.4 C)  TempSrc:   Oral Oral  Resp: 20 20 14 22   Height:      Weight:      SpO2:   96% 96%    Intake/Output Summary (Last 24 hours) at 09/05/12 1628 Last data filed at 09/05/12 1400  Gross per 24 hour  Intake   4460 ml  Output   1525 ml  Net   2935 ml   Filed Weights   08/31/12 2123 09/01/12 2119 09/04/12 1007  Weight: 58.015 kg (127 lb 14.4 oz) 57.289 kg (126 lb 4.8 oz) 59.557 kg (131 lb 4.8 oz)    Exam:  General: middle aged female In NAD.  HEENT: no pallor, moist oral mucosa  Cardiovascular: NS1 & S2, no murmurs  Respiratory: Clear to auscultation bilaterally  Abdomen: soft, nontender,ND, BS+  Ext: warm no edema  CNS: alert, still quite confused on answering questions,  she is more conversant and communicative for past 2 days.   Data Reviewed: Basic Metabolic Panel:  Lab 09/04/12 4098 09/03/12 0500 09/02/12 0559 09/01/12 0705 08/31/12 0605  NA 138 133* 134* 138 139  K 4.0 4.1 3.6 3.6 3.7  CL 102 98 97 99 100  CO2 28 27 26 28 27   GLUCOSE 91 86 93 97 99  BUN 15 10 9 8 10   CREATININE 0.64 0.66 0.63 0.57 0.70  CALCIUM 9.2 9.2 9.0 9.2 9.0  MG -- -- -- -- --  PHOS -- -- -- -- --   Liver Function Tests: No results found for this basename: AST:5,ALT:5,ALKPHOS:5,BILITOT:5,PROT:5,ALBUMIN:5 in the last 168 hours  Lab 09/01/12 2210  LIPASE 14  AMYLASE --    Lab 08/30/12 0505  AMMONIA 42   CBC:  Lab 09/04/12 0510 09/03/12 0500 09/02/12 0559 09/01/12 0705 08/31/12 0605  WBC 4.1 4.5 6.0 5.9 6.9  NEUTROABS -- -- -- -- --  HGB 13.7 13.3 14.4 15.2* 15.8*  HCT 42.3 40.3 43.8 46.0 47.6*  MCV 94.0 93.1 93.4 94.3 93.0  PLT 225 279 298 284 280   Cardiac Enzymes: No results found for this basename: CKTOTAL:5,CKMB:5,CKMBINDEX:5,TROPONINI:5 in the last 168 hours BNP (last 3 results)  Basename 04/23/12 0327 04/18/12 1924  PROBNP 19352.0* 16764.0*   CBG:  Lab 09/03/12  1722  GLUCAP 125*    No results found for this or any previous visit (from the past 240 hour(s)).   Studies: Nm Myocar Multi W/spect W/wall Motion / Ef  09/05/2012  Lexiscan myovue:  Indication: Chest Pain  Patient received .4 mg of lexiscan as a bolus.  Resting ECG normal. HR increased from 80 to 123.  BP stable 127/64 mmHg.  No chest pain or dyspnea.  No ischemic changes  Images reconstructed in the vertical horizontal and short axis planes.  No ischemia or infarct.  QPS was 0  Surface images normal with EF 58% and no RWMA;s  Impression:  Normal Lexiscan myovue EF 58%  Charlton Haws MD [facility]   Original Report Authenticated By: Charlton Haws, M.D.     Scheduled Meds:   . ALPRAZolam  1 mg Oral TID  . amitriptyline  25 mg Oral QHS  . aspirin  325 mg Oral Daily  . carBAMazepine  400 mg  Per Tube TID  . celecoxib  100 mg Oral BID  . DULoxetine  30 mg Oral BID  . feeding supplement  237 mL Oral BID BM  . folic acid  1 mg Oral Daily  . gabapentin  100 mg Oral TID  . heparin subcutaneous  5,000 Units Subcutaneous Q8H  . levETIRAcetam  1,000 mg Oral BID  . lisinopril  10 mg Oral Daily  . pantoprazole  40 mg Oral BID AC  . regadenoson      . thiamine  100 mg Per Tube TID   Continuous Infusions:   . sodium chloride 100 mL/hr at 09/05/12 1536      Time spent: 25 MINUTES    Merryn Thaker  [facility] Pager [phone] If 8PM-8AM, please contact night-coverage at [email or web address], password [facility] 09/05/2012, 4:28 PM  LOS: 16 days

## 2012-09-05 NOTE — Progress Notes (Signed)
09/05/2012 Cipriano Mile OTR/L Pager [phone] Office [phone]

## 2012-09-06 DIAGNOSIS — I5032 Chronic diastolic (congestive) heart failure: Secondary | ICD-10-CM

## 2012-09-06 NOTE — Clinical Social Work Note (Signed)
CSW continuting to follow and monitor patient's progress. Nursing working with patient without a sitter today and will continue through the weekend. The plan is d/c Monday if nursing able to keep patient without a sitter through the weekend.  Genelle Bal, MSW, LCSW [phone]

## 2012-09-06 NOTE — Progress Notes (Signed)
Speech Language Pathology Treatment: Cognitive-Linguistic and Dysphagia Patient Details Name: Donna [name] MRN: [id number] DOB: [birth year] Today's Date: 09/06/2012 Time: 4098-1191 SLP Time Calculation (min): 12 min  Assessment / Plan / Recommendation Clinical Impression  Pt. sitting at nurses station when SLP arrived for cognitive-linguistic and dysphagia treatment.  Throughout session pt. consumed thin Sprite with consistent min verbal reminders for small sips (cup) without s/s aspiration present observed.  Treatment also included cognitive-linguistic facilitation.  Pt.'s verbal accuracy appears to have improved compared to initial assessment.  Verbal responses were at the short phrase to sentence level with mild perseverations on various words and mild dysnomia.  No cueing required to attend to SLP on pt.'s right side throughout session.  She needed moderate verbal/visual/tactile cues to see images/words on the right side of the page (magazine).  Pt. followed one step commands with approximately 80% accuracy with difficutly attributed to motor apraxia versus comprehension deficits.  Pt. appears safe with small cup sips thin liquids with full supervision to comply.  SLP will continue to follow for dysphagia and cognitive-communicative therapya and recommend continues ST at discharge.            SLP Plan  Continue with current plan of care       SLP Goals  SLP Goals Potential to Achieve Goals: Good SLP Goal #1: Pt will express biasc wants needs during functional task as needed with min verbal cues.  SLP Goal #1 - Progress: Progressing toward goal SLP Goal #2: Pt will attend to objects in right visual field with min verbal cues during functional problem solving task.  SLP Goal #2 - Progress: Progressing toward goal SLP Goal #3: Pt will follow 1 step commands with 80 % accuracy with min visual cues.  SLP Goal #3 - Progress: Progressing toward goal  General Temperature Spikes Noted:  No Respiratory Status: Room air Behavior/Cognition: Alert;Cooperative;Pleasant mood;Requires cueing Oral Cavity - Dentition: Missing dentition Patient Positioning: Upright in chair  Oral Cavity - Oral Hygiene Does patient have any of the following "at risk" factors?: None of the above Brush patient's teeth BID with toothbrush (using toothpaste with fluoride): Yes   Treatment Treatment focused on: Cognition;Aphasia (dysphagia) Skilled Treatment: min verbal/visual/tactile cues   GO     Royce Macadamia M.Ed [employer] [phone]  09/06/2012

## 2012-09-06 NOTE — Progress Notes (Addendum)
[facility] PROGRESS NOTE  Donna [name] YTK:[id number] DOB: [birth year] DOA: 08/20/2012 PCP: Louie Boston, MD  Brief narrative:  56 year old female patient was found unresponsive by her neighbors. O2 sat was 76% in route to the hospital despite EMS placing patient on CPAP. She has a history of polysubstance abuse including cocaine and noncompliance. Upon arrival to the emergency department at [facility], the patient was quite hypoxic and began having a seizure and was immediately intubated by the emergency room physician. Subsequent EKG revealed anterior ST segment elevation. Cardiologist on call reviewed the EKG. Because of patient's history of prior normal cardiac catheterization in 2011 and other explanatory factors for patient's abnormal EKG and altered mental status, severe hypoxia and seizure and fevers it was determined code STEMI would not be activated. Because of need for ICU admission and patient subsequently developed recurrent seizures consistent with status epilepticus pulmonary critical care medicine except that the patient in transfer at [facility]. In the interim she was started on a Versed drip. Because of the EKG changes she was also started on IV heparin. She was also given Ativan to help suppress her seizures and was loaded with Keppra.  Additional history was obtained after admission confirming that patient had been found unresponsive with an empty oxycodone bottle next to her. Her urine drug screen was positive for cocaine after arrival. There were concerns that patient may have aspiration pneumonitis so she was briefly given antibiotics. CT of the head was negative despite no dictation of right-sided weakness which apparently is chronic. Patient eventually self extubated on 08/24/2012. She was subsequently transferred out of the ICU on 08/26/2012 [facility]   Assessment/Plan:  Principle problem:  STEMI (ST elevation myocardial infarction) due to acute cocaine use  -not felt  a candidate for acute cardiac catheterization . -previous catheterization June 2011 revealed no coronary artery disease  -Troponin peaked at 71 and has trended downward- initial EKG had ST elevation in anterior leads.  - likely MI precipitated by concomitant use of cocaine with associated overdose and hypoxemia  -patient has encephalopathy with significant word finding difficulty and cardiology recommend for non invasive treatment for now.  -Because of cocaine use cannot use beta blocker. Added lisinopril.  -Echocardiogram this admission shows no new regional wall motion abnormalities and demonstrates persistent apical akinesis.  -lesiscan myoview done on 12/26, with no signs of ischemia . EF of 58% . Cardiology signed off  Active Problems:  Acute encephalopathy  *Acute encephalopathy likely from the above-stated metabolic issues  Patent was admitted at [facility] in September with diagnosis of wernicke's encephalopathy. An MRI obtained at that time was suggestive Posterior reversible encephalopathy syndrome( PRES). apparently she got better on discharge.  -repeat MRI showed Improved gray-white matter signal in the brain compared to previous study. . Residual signal abnormality in the left thalamus suggests possible seizure vs ischemia.  - Seen by neurology who recommends to continue Keppra for now.  -Continue thiamine and folate.  -appreciate psych recs. No suicidal ideation.  -[name] was discontinued however had to be placed back because of fall risk.  -needs SNF on discharge however having fall risk and need of a sitter may prolong hospital stay.  Social work to assist with placement.  -patient appears more alert and communicative.   Urinary retention since 12/23  Noted for significant urinary retention on bladder scan requiring in and out catheterization. Placed a foley. UA unremarkable.  -d/c foley on 12/26. Have been able to void without difficulty today  Acute respiratory  failure with  hypoxia/ Aspiration pneumonitis  *Multifactorial secondary to narcotic overdose with resultant hypoxemia and altered mentation requiring intubation  *Likely also has some aspiration pneumonitis . patient has completed a course of Unasyn   AKI  Likely prerenal with hypoperfusion. Now resolved   Thrombocytopenia  Resolved   Status epilepticus  Possibly substance induced seizure. No seizure activity since 48 hrs post admission  Cont keppra   Hep /Cirrhosis with alcoholism  *Cirrhotic changes seen on previous imaging but total bilirubin normal   Dysphagia  On dys level 3 diet   Hypokalemia  *Resolved   COPD (chronic obstructive pulmonary disease)  *Stable .Cont prn nebs   Hypernatremia  *Resolved with hydration   GERD Cont PPI and maalox  Chronic diastolic congestive heart failure, NYHA class 1  stable  DVT prophylaxis   consults PCCM  cardiology  psych  Neurology   Antibiotics:  Levaquin 12/10 x 1  Vancomycin 12/10 x 1  Unasyn 12/10 >>> 12/14    Dispo: to SNF. Hospital stay prolonged due to fall risk and need for sitter off and on that is delaying her discharge. patient has been more oriented for past 2-3 days and if stable can d/c sitter in a day or 2. If stable without requiring a sitter , she is ready for discharge.  HPI/Subjective:  No overnight issues. Progressively oriented daily for past 3 days  Objective: Filed Vitals:   09/05/12 1500 09/05/12 2142 09/06/12 0501 09/06/12 0955  BP: 132/68 110/61 104/70 134/96  Pulse: 86 84 82 95  Temp: 97.6 F (36.4 C) 97.8 F (36.6 C) 97.3 F (36.3 C) 97.8 F (36.6 C)  TempSrc: Oral Axillary Axillary Oral  Resp: 22 18 18 20   Height:      Weight:  59.739 kg (131 lb 11.2 oz)    SpO2: 96% 96% 95% 96%    Intake/Output Summary (Last 24 hours) at 09/06/12 1408 Last data filed at 09/06/12 1056  Gross per 24 hour  Intake   1760 ml  Output   1100 ml  Net    660 ml   Filed Weights   09/01/12 2119 09/04/12 1007  09/05/12 2142  Weight: 57.289 kg (126 lb 4.8 oz) 59.557 kg (131 lb 4.8 oz) 59.739 kg (131 lb 11.2 oz)    Exam:  General: middle aged female In NAD.  HEENT: no pallor, moist oral mucosa  Cardiovascular: NS1 & S2, no murmurs  Respiratory: Clear to auscultation bilaterally  Abdomen: soft, nontender,ND, BS+  Ext: warm no edema  CNS: alert, still quite confused on answering questions, she is more conversant and communicative for past 3 days.   Data Reviewed: Basic Metabolic Panel:  Lab 09/04/12 1610 09/03/12 0500 09/02/12 0559 09/01/12 0705 08/31/12 0605  NA 138 133* 134* 138 139  K 4.0 4.1 3.6 3.6 3.7  CL 102 98 97 99 100  CO2 28 27 26 28 27   GLUCOSE 91 86 93 97 99  BUN 15 10 9 8 10   CREATININE 0.64 0.66 0.63 0.57 0.70  CALCIUM 9.2 9.2 9.0 9.2 9.0  MG -- -- -- -- --  PHOS -- -- -- -- --   Liver Function Tests: No results found for this basename: AST:5,ALT:5,ALKPHOS:5,BILITOT:5,PROT:5,ALBUMIN:5 in the last 168 hours  Lab 09/01/12 2210  LIPASE 14  AMYLASE --   No results found for this basename: AMMONIA:5 in the last 168 hours CBC:  Lab 09/04/12 0510 09/03/12 0500 09/02/12 0559 09/01/12 0705 08/31/12 0605  WBC 4.1 4.5 6.0  5.9 6.9  NEUTROABS -- -- -- -- --  HGB 13.7 13.3 14.4 15.2* 15.8*  HCT 42.3 40.3 43.8 46.0 47.6*  MCV 94.0 93.1 93.4 94.3 93.0  PLT 225 279 298 284 280   Cardiac Enzymes: No results found for this basename: CKTOTAL:5,CKMB:5,CKMBINDEX:5,TROPONINI:5 in the last 168 hours BNP (last 3 results)  Basename 04/23/12 0327 04/18/12 1924  PROBNP 19352.0* 16764.0*   CBG:  Lab 09/03/12 1722  GLUCAP 125*    No results found for this or any previous visit (from the past 240 hour(s)).   Studies: Nm Myocar Multi W/spect W/wall Motion / Ef  09/05/2012  Lexiscan myovue:  Indication: Chest Pain  Patient received .4 mg of lexiscan as a bolus.  Resting ECG normal. HR increased from 80 to 123.  BP stable 127/64 mmHg.  No chest pain or dyspnea.  No ischemic  changes  Images reconstructed in the vertical horizontal and short axis planes.  No ischemia or infarct.  QPS was 0  Surface images normal with EF 58% and no RWMA;s  Impression:  Normal Lexiscan myovue EF 58%  Charlton Haws MD [facility]   Original Report Authenticated By: Charlton Haws, M.D.     Scheduled Meds:   . ALPRAZolam  1 mg Oral TID  . amitriptyline  25 mg Oral QHS  . aspirin  325 mg Oral Daily  . carBAMazepine  400 mg Per Tube TID  . celecoxib  100 mg Oral BID  . DULoxetine  30 mg Oral BID  . feeding supplement  237 mL Oral BID BM  . folic acid  1 mg Oral Daily  . gabapentin  100 mg Oral TID  . heparin subcutaneous  5,000 Units Subcutaneous Q8H  . levETIRAcetam  1,000 mg Oral BID  . lisinopril  10 mg Oral Daily  . pantoprazole  40 mg Oral BID AC  . thiamine  100 mg Per Tube TID   Continuous Infusions:   . sodium chloride 100 mL/hr at 09/06/12 0049      Time spent:25 minutes    Jaselle Pryer  [facility] Pager [phone]. If 8PM-8AM, please contact night-coverage at [email or web address], password [facility] 09/06/2012, 2:08 PM  LOS: 17 days

## 2012-09-06 NOTE — Progress Notes (Signed)
[id number] Patient voided on her own about 500cc

## 2012-09-06 NOTE — Progress Notes (Signed)
Patient has not voided since foley catheter was taken out around 19:00.Bladder scan done and got >615 cc,assisted patient to [facility] to try to urinate but pt unable to void.In and out cath done as was previously ordered and obtained 800 cc of amber colored urine,Will continue to monitor. Zulma Court Joselita,RN

## 2012-09-07 DIAGNOSIS — E870 Hyperosmolality and hypernatremia: Secondary | ICD-10-CM

## 2012-09-07 NOTE — Progress Notes (Signed)
[facility] PROGRESS NOTE  Donna [name]:[id number] DOB: [birth year] DOA: 08/20/2012 PCP: Louie Boston, MD  Brief narrative:  56 year old female patient was found unresponsive by her neighbors. O2 sat was 76% in route to the hospital despite EMS placing patient on CPAP. She has a history of polysubstance abuse including cocaine and noncompliance. Upon arrival to the emergency department at [facility], the patient was quite hypoxic and began having a seizure and was immediately intubated by the emergency room physician. Subsequent EKG revealed anterior ST segment elevation. Cardiologist on call reviewed the EKG. Because of patient's history of prior normal cardiac catheterization in 2011 and other explanatory factors for patient's abnormal EKG and altered mental status, severe hypoxia and seizure and fevers it was determined code STEMI would not be activated. Because of need for ICU admission and patient subsequently developed recurrent seizures consistent with status epilepticus pulmonary critical care medicine except that the patient in transfer [facility]. In the interim she was started on a Versed drip. Because of the EKG changes she was also started on IV heparin. She was also given Ativan to help suppress her seizures and was loaded with Keppra.  Additional history was obtained after admission confirming that patient had been found unresponsive with an empty oxycodone bottle next to her. Her urine drug screen was positive for cocaine after arrival. There were concerns that patient may have aspiration pneumonitis so she was briefly given antibiotics. CT of the head was negative despite no dictation of right-sided weakness which apparently is chronic. Patient eventually self extubated on 08/24/2012. She was subsequently transferred out of the ICU on 08/26/2012 [facility]   Assessment/Plan:  Principle problem:  STEMI (ST elevation myocardial infarction) due to acute cocaine use  -not felt  a candidate for acute cardiac catheterization . -previous catheterization June 2011 revealed no coronary artery disease  -Troponin peaked at 73 and has trended downward- initial EKG had ST elevation in anterior leads.  - likely MI precipitated by concomitant use of cocaine with associated overdose and hypoxemia  -patient has encephalopathy with significant word finding difficulty and cardiology recommend for non invasive treatment for now.  -Because of cocaine use cannot use beta blocker. Added lisinopril.  -Echocardiogram this admission shows no new regional wall motion abnormalities and demonstrates persistent apical akinesis.  -lesiscan myoview done on 12/26, with no signs of ischemia . EF of 58% . Cardiology signed off  Active Problems:  Acute encephalopathy  *Acute encephalopathy likely from the above-stated metabolic issues  Patent was admitted at [facility] in September with diagnosis of wernicke's encephalopathy. An MRI obtained at that time was suggestive Posterior reversible encephalopathy syndrome( PRES). apparently she got better on discharge.  -repeat MRI showed Improved gray-white matter signal in the brain compared to previous study. . Residual signal abnormality in the left thalamus suggests possible seizure vs ischemia.  - Seen by neurology who recommends to continue Keppra for now.  -Continue thiamine and folate.  -appreciate psych recs. No suicidal ideation.  -[name] was discontinued however had to be placed back because of fall risk.  -needs SNF on discharge however having fall risk and need of a sitter has prolong hospital stay.  Social work to assist with placement.  -patient appears to be alert and appropriate  -sitter dc'ed 12/27, follow and plan to d/c 12/30 if no further falls Urinary retention since 12/23  Noted for significant urinary retention on bladder scan requiring in and out catheterization. Placed a foley. UA unremarkable.  -d/c foley  on 12/26.  -resolving, pt able  to void spontaneously today 12/28,  Acute respiratory failure with hypoxia/ Aspiration pneumonitis  *Multifactorial secondary to narcotic overdose with resultant hypoxemia and altered mentation requiring intubation  *Likely also has some aspiration pneumonitis . patient has completed a course of Unasyn   AKI  Likely prerenal with hypoperfusion. Now resolved   Thrombocytopenia  Resolved   Status epilepticus  Possibly substance induced seizure. No seizure activity since 48 hrs post admission  Cont keppra   Hep /Cirrhosis with alcoholism  *Cirrhotic changes seen on previous imaging but total bilirubin normal   Dysphagia  On dys level 3 diet   Hypokalemia  *Resolved   COPD (chronic obstructive pulmonary disease)  *Stable .Cont prn nebs   Hypernatremia  *Resolved with hydration   GERD Cont PPI and maalox  Chronic diastolic congestive heart failure, NYHA class 1  stable  DVT prophylaxis   consults PCCM  cardiology  psych  Neurology   Antibiotics:  Levaquin 12/10 x 1  Vancomycin 12/10 x 1  Unasyn 12/10 >>> 12/14    Dispo: to SNF if does well w/o sitter. HPI/Subjective:  Denies any c/o, chart reviewed  Objective: Filed Vitals:   09/07/12 1011 09/07/12 1531 09/07/12 1843 09/07/12 2053  BP: 115/81 133/78 136/79 117/65  Pulse: 91 87 90 85  Temp: 97.2 F (36.2 C) 98.1 F (36.7 C) 98.4 F (36.9 C) 97.4 F (36.3 C)  TempSrc: Oral Oral Oral Oral  Resp: 20 20 20 20   Height:      Weight:    61.689 kg (136 lb)  SpO2: 92% 93% 94% 98%    Intake/Output Summary (Last 24 hours) at 09/07/12 2156 Last data filed at 09/07/12 1942  Gross per 24 hour  Intake 3521.67 ml  Output      0 ml  Net 3521.67 ml   Filed Weights   09/05/12 2142 09/06/12 2156 09/07/12 2053  Weight: 59.739 kg (131 lb 11.2 oz) 60.873 kg (134 lb 3.2 oz) 61.689 kg (136 lb)    Exam:  General: middle aged female In NAD.  HEENT: no pallor, moist oral mucosa  Cardiovascular: NS1 & S2, no murmurs   Respiratory: Clear to auscultation bilaterally  Abdomen: soft, nontender,ND, BS+  Ext: warm no edema  CNS: alertand oriented x2,anwers appropriately Data Reviewed: Basic Metabolic Panel:  Lab 09/04/12 1610 09/03/12 0500 09/02/12 0559 09/01/12 0705  NA 138 133* 134* 138  K 4.0 4.1 3.6 3.6  CL 102 98 97 99  CO2 28 27 26 28   GLUCOSE 91 86 93 97  BUN 15 10 9 8   CREATININE 0.64 0.66 0.63 0.57  CALCIUM 9.2 9.2 9.0 9.2  MG -- -- -- --  PHOS -- -- -- --   Liver Function Tests: No results found for this basename: AST:5,ALT:5,ALKPHOS:5,BILITOT:5,PROT:5,ALBUMIN:5 in the last 168 hours  Lab 09/01/12 2210  LIPASE 14  AMYLASE --   No results found for this basename: AMMONIA:5 in the last 168 hours CBC:  Lab 09/04/12 0510 09/03/12 0500 09/02/12 0559 09/01/12 0705  WBC 4.1 4.5 6.0 5.9  NEUTROABS -- -- -- --  HGB 13.7 13.3 14.4 15.2*  HCT 42.3 40.3 43.8 46.0  MCV 94.0 93.1 93.4 94.3  PLT 225 279 298 284   Cardiac Enzymes: No results found for this basename: CKTOTAL:5,CKMB:5,CKMBINDEX:5,TROPONINI:5 in the last 168 hours BNP (last 3 results)  Basename 04/23/12 0327 04/18/12 1924  PROBNP 19352.0* 16764.0*   CBG:  Lab 09/03/12 1722  GLUCAP 125*  No results found for this or any previous visit (from the past 240 hour(s)).   Studies: No results found.  Scheduled Meds:    . ALPRAZolam  1 mg Oral TID  . amitriptyline  25 mg Oral QHS  . aspirin  325 mg Oral Daily  . carBAMazepine  400 mg Per Tube TID  . celecoxib  100 mg Oral BID  . DULoxetine  30 mg Oral BID  . feeding supplement  237 mL Oral BID BM  . folic acid  1 mg Oral Daily  . gabapentin  100 mg Oral TID  . heparin subcutaneous  5,000 Units Subcutaneous Q8H  . levETIRAcetam  1,000 mg Oral BID  . lisinopril  10 mg Oral Daily  . pantoprazole  40 mg Oral BID AC  . thiamine  100 mg Per Tube TID   Continuous Infusions:    . sodium chloride 100 mL/hr at 09/07/12 1754      Time spent:25  minutes    Anwitha Mapes C  [facility] Pager [phone]. If 8PM-8AM, please contact night-coverage at [email or web address], password [facility] 09/07/2012, 9:56 PM  LOS: 18 days

## 2012-09-08 ENCOUNTER — Inpatient Hospital Stay (HOSPITAL_COMMUNITY): Payer: Medicare Other

## 2012-09-08 LAB — CBC WITH DIFFERENTIAL/PLATELET
Basophils Absolute: 0 10*3/uL (ref 0.0–0.1)
Eosinophils Relative: 3 % (ref 0–5)
HCT: 43.5 % (ref 36.0–46.0)
Lymphocytes Relative: 39 % (ref 12–46)
MCHC: 32.4 g/dL (ref 30.0–36.0)
MCV: 93.8 fL (ref 78.0–100.0)
Monocytes Absolute: 0.3 10*3/uL (ref 0.1–1.0)
RDW: 12.8 % (ref 11.5–15.5)
WBC: 5 10*3/uL (ref 4.0–10.5)

## 2012-09-08 MED ORDER — ACETAMINOPHEN 325 MG PO TABS
650.0000 mg | ORAL_TABLET | Freq: Four times a day (QID) | ORAL | Status: DC | PRN
Start: 2012-09-08 — End: 2012-09-09
  Filled 2012-09-08: qty 2

## 2012-09-08 NOTE — Progress Notes (Signed)
[facility] PROGRESS NOTE  Donna [name]:[id number] DOB: [birth year] DOA: 08/20/2012 PCP: Louie Boston, MD  Brief narrative:  56 year old female patient was found unresponsive by her neighbors. O2 sat was 76% in route to the hospital despite EMS placing patient on CPAP. She has a history of polysubstance abuse including cocaine and noncompliance. Upon arrival to the emergency department at [facility], the patient was quite hypoxic and began having a seizure and was immediately intubated by the emergency room physician. Subsequent EKG revealed anterior ST segment elevation. Cardiologist on call reviewed the EKG. Because of patient's history of prior normal cardiac catheterization in 2011 and other explanatory factors for patient's abnormal EKG and altered mental status, severe hypoxia and seizure and fevers it was determined code STEMI would not be activated. Because of need for ICU admission and patient subsequently developed recurrent seizures consistent with status epilepticus pulmonary critical care medicine except that the patient in transfer at [facility]. In the interim she was started on a Versed drip. Because of the EKG changes she was also started on IV heparin. She was also given Ativan to help suppress her seizures and was loaded with Keppra.  Additional history was obtained after admission confirming that patient had been found unresponsive with an empty oxycodone bottle next to her. Her urine drug screen was positive for cocaine after arrival. There were concerns that patient may have aspiration pneumonitis so she was briefly given antibiotics. CT of the head was negative despite no dictation of right-sided weakness which apparently is chronic. Patient eventually self extubated on 08/24/2012. She was subsequently transferred out of the ICU on 08/26/2012 [facility]   Assessment/Plan:  Principle problem:  STEMI (ST elevation myocardial infarction) due to acute cocaine use  -not felt  a candidate for acute cardiac catheterization . -previous catheterization June 2011 revealed no coronary artery disease  -Troponin peaked at 74 and has trended downward- initial EKG had ST elevation in anterior leads.  - likely MI precipitated by concomitant use of cocaine with associated overdose and hypoxemia  -patient has encephalopathy with significant word finding difficulty and cardiology recommend for non invasive treatment for now.  -Because of cocaine use cannot use beta blocker. Added lisinopril.  -Echocardiogram this admission shows no new regional wall motion abnormalities and demonstrates persistent apical akinesis.  -lesiscan myoview done on 12/26, with no signs of ischemia . EF of 58% . Cardiology signed off  Active Problems:  Acute encephalopathy  *Acute encephalopathy likely from the above-stated metabolic issues  Patent was admitted at [facility] in September with diagnosis of wernicke's encephalopathy. An MRI obtained at that time was suggestive Posterior reversible encephalopathy syndrome( PRES). apparently she got better on discharge.  -repeat MRI showed Improved gray-white matter signal in the brain compared to previous study. . Residual signal abnormality in the left thalamus suggests possible seizure vs ischemia.  - Seen by neurology who recommends to continue Keppra for now.  -Continue thiamine and folate.  -appreciate psych recs. No suicidal ideation.  -[name] was discontinued however had to be placed back because of fall risk.  -needs SNF on discharge however having fall risk and need of a sitter has prolong hospital stay.  Social work to assist with placement.  -patient appears to be alert and appropriate  -sitter dc'ed 12/27, follow and plan to d/c 12/30 if no further falls Urinary retention since 12/23  Noted for significant urinary retention on bladder scan requiring in and out catheterization. Placed a foley. UA unremarkable.  -d/c foley  on 12/26.  -resolving, pt able  to void spontaneously today 12/28,  Acute respiratory failure with hypoxia/ Aspiration pneumonitis  *Multifactorial secondary to narcotic overdose with resultant hypoxemia and altered mentation requiring intubation  *Likely also has some aspiration pneumonitis . patient has completed a course of Unasyn   AKI  Likely prerenal with hypoperfusion. Now resolved   Thrombocytopenia  Resolved   Status epilepticus  Possibly substance induced seizure. No seizure activity since 48 hrs post admission  Cont keppra   Hep /Cirrhosis with alcoholism  *Cirrhotic changes seen on previous imaging but total bilirubin normal   Dysphagia  On dys level 3 diet   Hypokalemia  *Resolved   COPD (chronic obstructive pulmonary disease)  *Stable .Cont prn nebs   Hypernatremia  *Resolved with hydration   GERD Cont PPI and maalox  Chronic diastolic congestive heart failure, NYHA class 1  stable  DVT prophylaxis   ABD. Pain -recheck lipase, follow. Recent abd x-ray neg  consults PCCM  cardiology  psych  Neurology   Antibiotics:  Levaquin 12/10 x 1  Vancomycin 12/10 x 1  Unasyn 12/10 >>> 12/14    Dispo: continuing to do well without sitter  HPI/Subjective:   c/o left sided abd pain, denies n/v, no diarrhea.+BM  Objective: Filed Vitals:   09/07/12 1843 09/07/12 2053 09/08/12 0519 09/08/12 1021  BP: 136/79 117/65 116/70 115/81  Pulse: 90 85 79 90  Temp: 98.4 F (36.9 C) 97.4 F (36.3 C) 97.9 F (36.6 C) 97.6 F (36.4 C)  TempSrc: Oral Oral Oral Oral  Resp: 20 20 20 18   Height:      Weight:  61.689 kg (136 lb)    SpO2: 94% 98% 94% 97%    Intake/Output Summary (Last 24 hours) at 09/08/12 1303 Last data filed at 09/08/12 0100  Gross per 24 hour  Intake 2516.67 ml  Output   1400 ml  Net 1116.67 ml   Filed Weights   09/05/12 2142 09/06/12 2156 09/07/12 2053  Weight: 59.739 kg (131 lb 11.2 oz) 60.873 kg (134 lb 3.2 oz) 61.689 kg (136 lb)    Exam:  General: middle aged  female In NAD.  HEENT: no pallor, moist oral mucosa  Cardiovascular: NS1 & S2, no murmurs  Respiratory: Clear to auscultation bilaterally  Abdomen: soft, mild left mid abd tenderness,ND, BS+  Ext: warm no edema  CNS: alertand oriented x2,anwers appropriately Data Reviewed: Basic Metabolic Panel:  Lab 09/04/12 4098 09/03/12 0500 09/02/12 0559  NA 138 133* 134*  K 4.0 4.1 3.6  CL 102 98 97  CO2 28 27 26   GLUCOSE 91 86 93  BUN 15 10 9   CREATININE 0.64 0.66 0.63  CALCIUM 9.2 9.2 9.0  MG -- -- --  PHOS -- -- --   Liver Function Tests: No results found for this basename: AST:5,ALT:5,ALKPHOS:5,BILITOT:5,PROT:5,ALBUMIN:5 in the last 168 hours  Lab 09/01/12 2210  LIPASE 14  AMYLASE --   No results found for this basename: AMMONIA:5 in the last 168 hours CBC:  Lab 09/04/12 0510 09/03/12 0500 09/02/12 0559  WBC 4.1 4.5 6.0  NEUTROABS -- -- --  HGB 13.7 13.3 14.4  HCT 42.3 40.3 43.8  MCV 94.0 93.1 93.4  PLT 225 279 298   Cardiac Enzymes: No results found for this basename: CKTOTAL:5,CKMB:5,CKMBINDEX:5,TROPONINI:5 in the last 168 hours BNP (last 3 results)  Basename 04/23/12 0327 04/18/12 1924  PROBNP 19352.0* 16764.0*   CBG:  Lab 09/03/12 1722  GLUCAP 125*    No results found  for this or any previous visit (from the past 240 hour(s)).   Studies: No results found.  Scheduled Meds:    . ALPRAZolam  1 mg Oral TID  . amitriptyline  25 mg Oral QHS  . aspirin  325 mg Oral Daily  . carBAMazepine  400 mg Per Tube TID  . celecoxib  100 mg Oral BID  . DULoxetine  30 mg Oral BID  . feeding supplement  237 mL Oral BID BM  . folic acid  1 mg Oral Daily  . gabapentin  100 mg Oral TID  . heparin subcutaneous  5,000 Units Subcutaneous Q8H  . levETIRAcetam  1,000 mg Oral BID  . lisinopril  10 mg Oral Daily  . pantoprazole  40 mg Oral BID AC  . thiamine  100 mg Per Tube TID   Continuous Infusions:    . sodium chloride 100 mL/hr at 09/08/12 0458      Time spent:25  minutes    Dejane Scheibe C  [facility] Pager [phone]. If 8PM-8AM, please contact night-coverage at [email or web address], password [facility] 09/08/2012, 1:03 PM  LOS: 19 days

## 2012-09-08 NOTE — Progress Notes (Signed)
Patient c/o sharp abdominal  pain mostly on left side,also c/o nausea and vomiting.Patient vomited 2x,large amount of food particles.Per patient,she had this pain and n/v since yesterday.[employer]ings notified,orders received.Will continue to monitor. Kaleiah Kutzer Joselita,RN

## 2012-09-09 DIAGNOSIS — R079 Chest pain, unspecified: Secondary | ICD-10-CM

## 2012-09-09 LAB — BASIC METABOLIC PANEL
BUN: 9 mg/dL (ref 6–23)
CO2: 25 mEq/L (ref 19–32)
Chloride: 97 mEq/L (ref 96–112)
Creatinine, Ser: 0.36 mg/dL — ABNORMAL LOW (ref 0.50–1.10)

## 2012-09-09 MED ORDER — FLEET ENEMA 7-19 GM/118ML RE ENEM
1.0000 | ENEMA | Freq: Once | RECTAL | Status: AC
  Administered 2012-09-09: 1 via RECTAL
  Filled 2012-09-09: qty 1

## 2012-09-09 NOTE — Progress Notes (Signed)
Patient c/o pain on right chest area,described as sharp pain,patient also with more confusion and lethargic.BP 165/109 HR 92 T 97.0 RR 20 O2 sat 93 on RA.Dr. Suanne Marker notified of patient's c/o and confusion.Order received to do EKG and Cardiac enzyme Q6h x3,first now.Incoming RN made aware. Iman Reinertsen Joselita,RN

## 2012-09-09 NOTE — Progress Notes (Signed)
Occupational Therapy Treatment Patient Details Name: Donna [name] MRN: [id number] DOB: 1956-05-29 Today's Date: 09/09/2012 Time: 1315-1350 OT Time Calculation (min): 35 min  OT Assessment / Plan / Recommendation Comments on Treatment Session Pt continues to improve.  Overall only min assist needed for ambulation to the sink for grooming activity.  Does still exhibit RUE ataxia and decreased coordination but can use functionally with selfcare tasks and AE.  BP 160/102 during session and HR elevated from 115 to 145 with activity, which limited our ability to complet OT session.      Follow Up Recommendations  CIR             Frequency Min 2X/week   Plan Discharge plan remains appropriate    Precautions / Restrictions Precautions Precautions: Fall Restrictions Weight Bearing Restrictions: No   Pertinent Vitals/Pain HR 115 at rest increasing to 145 with activity, BP in supine 160/102 and 160/100 in sitting     ADL  Grooming: Performed;Teeth care;Minimal assistance Where Assessed - Grooming: Supported standing Toilet Transfer: Mining engineer Method: Surveyor, minerals: Other (comment) (To sink and back to EOB, pt declined need to toilet.) Transfers/Ambulation Related to ADLs: Pt able to transfer from edge of bed to sink and back to EOB with min assist and no assistive device.  Pt exhibited LOB to the right when returning back to bed. ADL Comments: Pt stood at the sink to brush her teeth.  During task she exhibited decreased ability to sequence through the task and needed mod assist to organize and complete.  She was holding the toothpaste and toothbrush in her right hand but was still looking in her wash basin for both of them.  She used the RUE with increased ataxia during the task and at times would switch to the left hand.  Therapist cued her to  keep trying with the right hand as much as possible.  In standing pt's HR increased to  the mid 140s so immediately returned back to EOB where it decreased to 105 with rest.  BP elevated however at 160/102 and 160/100 in sitting.  Educated pt and her sister Donna [name] on [place] coordination tasks that could be performed for the RUE in bed.       OT Goals Acute Rehab OT Goals OT Goal Formulation: With patient Time For Goal Achievement: 09/17/12 Potential to Achieve Goals: Good ADL Goals Pt Will Perform Grooming: with supervision;Standing at sink;Unsupported;Other (comment) (2 tasks) ADL Goal: Grooming - Progress: Goal set today Pt Will Perform Upper Body Dressing: with supervision;Sitting, bed;Unsupported ADL Goal: Upper Body Dressing - Progress: Goal set today Pt Will Perform Lower Body Dressing: with supervision;Sit to stand from bed;Unsupported ADL Goal: Lower Body Dressing - Progress: Goal set today Pt Will Transfer to Toilet: with supervision;3-in-1;with DME ADL Goal: Toilet Transfer - Progress: Goal set today Pt Will Perform Toileting - Clothing Manipulation: with supervision;Standing;Sitting on 3-in-1 or toilet ADL Goal: Toileting - Clothing Manipulation - Progress: Goal set today Pt Will Perform Toileting - Hygiene: with supervision;Sit to stand from 3-in-1/toilet ADL Goal: Toileting - Hygiene - Progress: Goal set today Additional ADL Goal #1: Pt will perform all selfcare and grooming tasks with no more than min instructional cueing to seqence through the task. ADL Goal: Additional Goal #1 - Progress: Updated due to goal met  Visit Information  Last OT Received On: 09/09/12 Assistance Needed: +1    Subjective Data  Subjective: I fell and they said I tried to kill myself.  (  When asked why she was in the hospital). Patient Stated Goal: Did not state.      Cognition  Overall Cognitive Status: Impaired Area of Impairment: Memory;Safety/judgement;Following commands Orientation Level: Disoriented to;Place;Time Behavior During Session: [facility] for tasks performed Current  Attention Level: Focused Following Commands: Follows one step commands consistently Problem Solving: Pt with difficulty sequencing grooming tasks.    Mobility  Shoulder Instructions Bed Mobility Bed Mobility: Supine to Sit;Sit to Supine Supine to Sit: 5: Supervision Sit to Supine: 5: Supervision Transfers Transfers: Sit to Stand Sit to Stand: 4: Min assist;Without upper extremity assist;From bed          Balance Static Sitting Balance Static Sitting - Balance Support: Right upper extremity supported;Left upper extremity supported;Feet supported Static Sitting - Level of Assistance: 6: Modified independent (Device/Increase time) Static Standing Balance Static Standing - Balance Support: Right upper extremity supported;Left upper extremity supported Static Standing - Level of Assistance: 4: Min assist   End of Session OT - End of Session Equipment Utilized During Treatment: Gait belt Activity Tolerance: Treatment limited secondary to medical complications (Comment);Other (comment) (elevated HR and blood pressure) Patient left: in bed;with family/visitor present Nurse Communication: Other (comment);Mobility status (need for assistance with red foam grips)     Donna [name] OTR/L Pager number [phone] 09/09/2012, 3:26 PM

## 2012-09-09 NOTE — Progress Notes (Signed)
Physical Therapy Treatment Patient Details Name: Donna [name] MRN: [id number] DOB: Apr 28, 1956 Today's Date: 09/09/2012 Time: 4098-1191 PT Time Calculation (min): 19 min  PT Assessment / Plan / Recommendation Comments on Treatment Session  Continues with right sided weakness and decreased motor control.  Progressing with ambulation.  Cognitive deficits continue to impact mobility and safety.    Follow Up Recommendations  SNF     Does the patient have the potential to tolerate intense rehabilitation     Barriers to Discharge        Equipment Recommendations  Rolling walker with 5" wheels    Recommendations for Other Services    Frequency Min 3X/week   Plan Discharge plan remains appropriate;Frequency remains appropriate    Precautions / Restrictions Precautions Precautions: Fall Restrictions Weight Bearing Restrictions: No   Pertinent Vitals/Pain     Mobility  Bed Mobility Bed Mobility: Supine to Sit;Sit to Supine Supine to Sit: 5: Supervision Sit to Supine: 5: Supervision Details for Bed Mobility Assistance: Verbal cues to wait until environment prepared before sitting.  Assist for safety. Transfers Transfers: Sit to Stand;Stand to Sit Sit to Stand: 4: Min assist;Without upper extremity assist;From bed Stand to Sit: 4: Min assist;With upper extremity assist;To bed Details for Transfer Assistance: Verbal cues to move slowly through transfers.  Patient with decreased balance in standing, and decreased control of RLE.  Cues for safety and hand placement to return to sitting. Ambulation/Gait Ambulation/Gait Assistance: 3: Mod assist Ambulation Distance (Feet): 85 Feet Assistive device: Rolling walker Ambulation/Gait Assistance Details: Verbal cues to stay close to RW.  Tactile and verbal cues to keep Rt. hand on RW.  RLE with decreased control, adducting at hip causing RLE to step on LLE.  Verbal and tactile cues to step out to side with RLE - patient unable to follow  directions to accomplish this.  Gait unsteady requiring mod assist for balance. Gait Pattern: Step-through pattern;Decreased stride length;Scissoring;Ataxic;Narrow base of support Gait velocity: decr      PT Goals Acute Rehab PT Goals PT Goal: Supine/Side to Sit - Progress: Progressing toward goal PT Goal: Sit to Supine/Side - Progress: Progressing toward goal PT Goal: Sit to Stand - Progress: Progressing toward goal PT Goal: Stand to Sit - Progress: Progressing toward goal PT Transfer Goal: Bed to Chair/Chair to Bed - Progress: Progressing toward goal PT Goal: Ambulate - Progress: Progressing toward goal  Visit Information  Last PT Received On: 09/09/12 Assistance Needed: +1    Subjective Data  Subjective: Patient with food in right side of bed and floor.  Patient states "I don't know how that got there"   Cognition  Overall Cognitive Status: Impaired Area of Impairment: Memory;Following commands;Safety/judgement;Awareness of deficits;Problem solving;Attention Arousal/Alertness: Awake/alert Orientation Level: Disoriented to;Place;Time;Situation Behavior During Session: [facility] for tasks performed Current Attention Level: Focused Attention - Other Comments: Redirection to task.  Distracted within 20 seconds Memory Deficits: Decreased recall of last PT session. Following Commands: Follows multi-step commands inconsistently Safety/Judgement: Impulsive;Decreased safety judgement for tasks assessed;Decreased awareness of need for assistance Safety/Judgement - Other Comments: Very impulsive and inattention to rt side Awareness of Deficits: Patient with difficulty telling PT why she is in hospital Problem Solving: Patient unable to move RW around [facility] - right wheel caught and patient needed physical assist to move RW.    Balance  Static Sitting Balance Static Sitting - Balance Support: Right upper extremity supported;Left upper extremity supported;Feet supported Static Sitting - Level of  Assistance: 6: Modified independent (Device/Increase time) Static Standing  Balance Static Standing - Balance Support: Right upper extremity supported;Left upper extremity supported Static Standing - Level of Assistance: 4: Min assist  End of Session PT - End of Session Equipment Utilized During Treatment: Gait belt Activity Tolerance: Patient tolerated treatment well Patient left: in bed;with call bell/phone within reach;with bed alarm set Nurse Communication: Mobility status (Food in bed (did not eat food off tray))   GP     Vena Austria 09/09/2012, 6:05 PM Durenda Hurt. Renaldo Fiddler, [facility] Acute Rehab Services Pager [phone]

## 2012-09-09 NOTE — Progress Notes (Signed)
09/09/2012 11:25 AM  Pt complained of inability to urinate although the urge to do so was present.  Bladder scan revealeed > 362 cc of urine in the bladder. In and out cath'd per Dr. Blain Pais order, about 475cc of clear yellow urine obtained.  Pt tolerated well and said she feels relieved.  Dr. Donna Bernard notified.  No further orders received.  wll continue to monitor patient. Eunice Blase

## 2012-09-09 NOTE — Progress Notes (Signed)
[facility] PROGRESS NOTE  Donna [name]:[id number] DOB: 1956-03-05 DOA: 08/20/2012 PCP: Donna Boston, MD  Brief narrative:  56 year old female patient was found unresponsive by her neighbors. O2 sat was 76% in route to the hospital despite EMS placing patient on CPAP. She has a history of polysubstance abuse including cocaine and noncompliance. Upon arrival to the emergency department at [facility], the patient was quite hypoxic and began having a seizure and was immediately intubated by the emergency room physician. Subsequent EKG revealed anterior ST segment elevation. Cardiologist on call reviewed the EKG. Because of patient's history of prior normal cardiac catheterization in 2011 and other explanatory factors for patient's abnormal EKG and altered mental status, severe hypoxia and seizure and fevers it was determined code STEMI would not be activated. Because of need for ICU admission and patient subsequently developed recurrent seizures consistent with status epilepticus pulmonary critical care medicine except that the patient in transfer at [facility]. In the interim she was started on a Versed drip. Because of the EKG changes she was also started on IV heparin. She was also given Ativan to help suppress her seizures and was loaded with Keppra.  Additional history was obtained after admission confirming that patient had been found unresponsive with an empty oxycodone bottle next to her. Her urine drug screen was positive for cocaine after arrival. There were concerns that patient may have aspiration pneumonitis so she was briefly given antibiotics. CT of the head was negative despite no dictation of right-sided weakness which apparently is chronic. Patient eventually self extubated on 08/24/2012. She was subsequently transferred out of the ICU on 08/26/2012 to [facility].   Assessment/Plan:  Principle problem:  STEMI (ST elevation myocardial infarction) due to acute cocaine use/chest  pain -not felt a candidate for acute cardiac catheterization . -previous catheterization June 2011 revealed no coronary artery disease  -Troponin peaked at 69 and has trended downward- initial EKG had ST elevation in anterior leads.  - likely MI precipitated by concomitant use of cocaine with associated overdose and hypoxemia  -patient has encephalopathy with significant word finding difficulty and cardiology recommend for non invasive treatment for now.  -Because of cocaine use cannot use beta blocker. Added lisinopril.  -Echocardiogram this admission shows no new regional wall motion abnormalities and demonstrates persistent apical akinesis.  -lexiscan myoview done on 12/26, with no signs of ischemia . EF of 58% . Cardiology signed off -Patient again with chest pain today, we'll cycle cardiac enzymes and follow-as above recent Myoview negative. -She also has this am 12/30 associated nausea vomiting-we will continue PPI Reglan when necessary, and followup on cardiac enzymes as ready mentioned.  Active Problems:  Acute encephalopathy  *Acute encephalopathy likely from the above-stated metabolic issues  Patent was admitted at [facility] in September with diagnosis of wernicke's encephalopathy. An MRI obtained at that time was suggestive Posterior reversible encephalopathy syndrome( PRES). apparently she got better on discharge.  -repeat MRI showed Improved gray-white matter signal in the brain compared to previous study. . Residual signal abnormality in the left thalamus suggests possible seizure vs ischemia.  - Seen by neurology who recommends to continue Keppra for now.  -Continue thiamine and folate.  -appreciate psych recs. No suicidal ideation.  -[name] was discontinued however had to be placed back because of fall risk.  -needs SNF on discharge however having fall risk and need of a sitter has prolong hospital stay.  Social work to assist with placement.  -patient appears to be alert and  appropriate  -  sitter dc'ed 12/27, follow and plan to d/c 12/30 if no further falls Urinary retention since 12/23  Noted for significant urinary retention on bladder scan requiring in and out catheterization. Placed a foley. UA unremarkable.  -d/c foley on 12/26.  -resolving, pt able to void spontaneously today 12/28,  Acute respiratory failure with hypoxia/ Aspiration pneumonitis  *Multifactorial secondary to narcotic overdose with resultant hypoxemia and altered mentation requiring intubation  *Likely also has some aspiration pneumonitis . patient has completed a course of Unasyn   AKI  Likely prerenal with hypoperfusion. Now resolved   Thrombocytopenia  Resolved   Status epilepticus  Possibly substance induced seizure. No seizure activity since 48 hrs post admission  Cont keppra   Hep /Cirrhosis with alcoholism  *Cirrhotic changes seen on previous imaging but total bilirubin normal   Dysphagia  On dys level 3 diet   Hypokalemia  *Resolved   COPD (chronic obstructive pulmonary disease)  *Stable .Cont prn nebs   Hypernatremia  *Resolved with hydration   GERD Cont PPI and maalox  Chronic diastolic congestive heart failure, NYHA class 1  stable  DVT prophylaxis   ABD. Pain  -resolved -lipase nl, follow. Recent abd x-ray neg   consults PCCM  cardiology  psych  Neurology   Antibiotics:  Levaquin 12/10 x 1  Vancomycin 12/10 x 1  Unasyn 12/10 >>> 12/14    Dispo: continuing to do well without sitter  HPI/Subjective:   c/o chest pain and nausea vomiting this a.m. Sister. at bedside.  Objective: Filed Vitals:   09/08/12 1822 09/08/12 2155 09/09/12 0505 09/09/12 0707  BP: 144/86 138/82 105/70 165/109  Pulse: 98 81 96 92  Temp: 97.9 F (36.6 C) 97.6 F (36.4 C) 97 F (36.1 C)   TempSrc: Oral Oral Oral   Resp: 19 20  20   Height:  5' 0.24" (1.53 m)    Weight:  60.555 kg (133 lb 8 oz)    SpO2: 95% 97% 94% 93%    Intake/Output Summary (Last 24 hours)  at 09/09/12 1214 Last data filed at 09/09/12 1126  Gross per 24 hour  Intake 2368.33 ml  Output    930 ml  Net 1438.33 ml   Filed Weights   09/06/12 2156 09/07/12 2053 09/08/12 2155  Weight: 60.873 kg (134 lb 3.2 oz) 61.689 kg (136 lb) 60.555 kg (133 lb 8 oz)    Exam:  General: middle aged female In NAD.   HEENT: no pallor, moist oral mucosa  Cardiovascular: NS1 & S2, no murmurs  Respiratory: Clear to auscultation bilaterally  Abdomen: soft, nontender,ND, BS+  Ext: warm no edema  CNS: alertand oriented x2,anwers appropriately Data Reviewed: Basic Metabolic Panel:  Lab 09/09/12 7829 09/04/12 0510 09/03/12 0500  NA 137 138 133*  K 3.2* 4.0 4.1  CL 97 102 98  CO2 25 28 27   GLUCOSE 110* 91 86  BUN 9 15 10   CREATININE 0.36* 0.64 0.66  CALCIUM 9.2 9.2 9.2  MG -- -- --  PHOS -- -- --   Liver Function Tests: No results found for this basename: AST:5,ALT:5,ALKPHOS:5,BILITOT:5,PROT:5,ALBUMIN:5 in the last 168 hours  Lab 09/08/12 1300  LIPASE 29  AMYLASE --   No results found for this basename: AMMONIA:5 in the last 168 hours CBC:  Lab 09/08/12 2211 09/04/12 0510 09/03/12 0500  WBC 5.0 4.1 4.5  NEUTROABS 2.6 -- --  HGB 14.1 13.7 13.3  HCT 43.5 42.3 40.3  MCV 93.8 94.0 93.1  PLT 208 225 279   Cardiac  Enzymes:  Lab 09/09/12 0930  CKTOTAL --  CKMB --  CKMBINDEX --  TROPONINI <0.30   BNP (last 3 results)  Basename 04/23/12 0327 04/18/12 1924  PROBNP 19352.0* 16764.0*   CBG:  Lab 09/03/12 1722  GLUCAP 125*    No results found for this or any previous visit (from the past 240 hour(s)).   Studies: Dg Abd Portable 1v  09/08/2012  *RADIOLOGY REPORT*  Clinical Data: Abdominal pain.  PORTABLE ABDOMEN - 1 VIEW  Comparison: 06/01/2012  Findings: No evidence of dilated bowel loops.  Large colonic stool burden is noted.  Cholecystectomy clips seen in the right upper quadrant as well as lower lumbar spine fusion hardware.  IMPRESSION:  1.  No acute findings. 2.   Large stool burden noted.   Original Report Authenticated By: Myles Rosenthal, M.D.     Scheduled Meds:    . ALPRAZolam  1 mg Oral TID  . amitriptyline  25 mg Oral QHS  . aspirin  325 mg Oral Daily  . carBAMazepine  400 mg Per Tube TID  . celecoxib  100 mg Oral BID  . DULoxetine  30 mg Oral BID  . feeding supplement  237 mL Oral BID BM  . folic acid  1 mg Oral Daily  . gabapentin  100 mg Oral TID  . heparin subcutaneous  5,000 Units Subcutaneous Q8H  . levETIRAcetam  1,000 mg Oral BID  . lisinopril  10 mg Oral Daily  . pantoprazole  40 mg Oral BID AC  . thiamine  100 mg Per Tube TID   Continuous Infusions:    . sodium chloride 100 mL/hr at 09/09/12 0008      Time spent:25 minutes    Aarohi Redditt C  [facility] Pager [phone]. If 8PM-8AM, please contact night-coverage at [email or web address], password [facility] 09/09/2012, 12:14 PM  LOS: 20 days
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
## 2012-09-09 NOTE — Progress Notes (Signed)
NUTRITION FOLLOW UP  Intervention:   1. Continue to encourage meals and offer appropriate alternatives as able 2. RD to continue to follow nutrition care plan  New Nutrition Dx: Swallowing difficulty r/t recent intubation and cognitive deficits AEB MBSS results and need for ST. Ongoing.  Goal:  Intake to meet >90% of estimated nutrition needs, met.   Monitor:  PO intake, weight trend, labs, I/O  Assessment:   Sitter discontinued on 12/27. Intake improved, eating well at present. Per chart, pt will take Ensure Complete supplements. Per most recent MD note, plan to d/c today (12/30) if no further falls.  Height: Ht Readings from Last 1 Encounters:  09/08/12 5' 0.24" (1.53 m)    Weight Status:  stable Wt Readings from Last 1 Encounters:  09/08/12 133 lb 8 oz (60.555 kg)   Re-estimated needs:  Kcal: 1550-1750 Protein: 75-90 gm Fluid: 1.5-1.7 L  Skin: no issues noted  Diet Order: Dysphagia 3 with thin liquids   Intake/Output Summary (Last 24 hours) at 09/09/12 1515 Last data filed at 09/09/12 1126  Gross per 24 hour  Intake 2368.33 ml  Output    930 ml  Net 1438.33 ml   Last BM: 12/30  Labs:   Lab 09/09/12 0740 09/04/12 0510 09/03/12 0500  NA 137 138 133*  K 3.2* 4.0 4.1  CL 97 102 98  CO2 25 28 27   BUN 9 15 10   CREATININE 0.36* 0.64 0.66  CALCIUM 9.2 9.2 9.2  MG -- -- --  PHOS -- -- --  GLUCOSE 110* 91 86   CBG (last 3)  No results found for this basename: GLUCAP:3 in the last 72 hours  Scheduled Meds:    . ALPRAZolam  1 mg Oral TID  . amitriptyline  25 mg Oral QHS  . aspirin  325 mg Oral Daily  . carBAMazepine  400 mg Per Tube TID  . celecoxib  100 mg Oral BID  . DULoxetine  30 mg Oral BID  . feeding supplement  237 mL Oral BID BM  . folic acid  1 mg Oral Daily  . gabapentin  100 mg Oral TID  . heparin subcutaneous  5,000 Units Subcutaneous Q8H  . levETIRAcetam  1,000 mg Oral BID  . lisinopril  10 mg Oral Daily  . pantoprazole  40 mg Oral  BID AC  . thiamine  100 mg Per Tube TID   Continuous Infusions:    . sodium chloride 100 mL/hr at 09/09/12 [address] MS, RD, LDN Pager: [phone] After-hours pager: [phone]

## 2012-09-09 NOTE — Progress Notes (Signed)
12/30 0400 Patient pulled IV out and Iv team called to assess. IV nurse Pearline Cables suggest that a MD look at patients right wrist. She suspects level 3 phlebitis and possible IV therapy. Please address. Thanks Nsg team

## 2012-09-09 NOTE — Progress Notes (Addendum)
09/09/2012 8:01 AM  Upon first assessment, pt lethargic but arousable to state name.  Pt seems very tired.  Received report that patient has been up all night sick to her stomach, and has not slept at all.  Bed alarm went off sometime later, and patient was alert, placed on BSC by NT. Pt no longer complaining of pain.  Will continue to monitor patient. Donna [name]   8:02 AM Pt also has mild redness without tenderness and slight edema to the lower right forearm/wrist area.  IV team is already aware.  MD notified.  Will continue to monitor patient. Donna [name]

## 2012-09-10 MED ORDER — ALBUTEROL SULFATE (5 MG/ML) 0.5% IN NEBU: 2.5000 mg | INHALATION_SOLUTION | RESPIRATORY_TRACT | Status: AC | PRN

## 2012-09-10 MED ORDER — ONDANSETRON 4 MG PO TBDP: 4.0000 mg | ORAL_TABLET | Freq: Four times a day (QID) | ORAL | Status: AC | PRN

## 2012-09-10 MED ORDER — PANTOPRAZOLE SODIUM 40 MG PO TBEC: 40.0000 mg | DELAYED_RELEASE_TABLET | Freq: Two times a day (BID) | ORAL | Status: AC

## 2012-09-10 MED ORDER — POTASSIUM CHLORIDE CRYS ER 20 MEQ PO TBCR
60.0000 meq | EXTENDED_RELEASE_TABLET | Freq: Once | ORAL | Status: AC
  Administered 2012-09-10: 60 meq via ORAL

## 2012-09-10 MED ORDER — METOPROLOL TARTRATE 50 MG PO TABS: 25.0000 mg | ORAL_TABLET | Freq: Two times a day (BID) | ORAL | Status: AC

## 2012-09-10 MED ORDER — GABAPENTIN 100 MG PO CAPS: 100.0000 mg | ORAL_CAPSULE | Freq: Three times a day (TID) | ORAL | Status: AC

## 2012-09-10 MED ORDER — ASPIRIN 325 MG PO TABS: 325.0000 mg | ORAL_TABLET | Freq: Every day | ORAL | Status: AC

## 2012-09-10 MED ORDER — LEVETIRACETAM 1000 MG PO TABS: 1000.0000 mg | ORAL_TABLET | Freq: Two times a day (BID) | ORAL | Status: AC

## 2012-09-10 MED ORDER — ENSURE COMPLETE PO LIQD: 237.0000 mL | Freq: Two times a day (BID) | ORAL | Status: AC

## 2012-09-10 MED ORDER — CARBAMAZEPINE 200 MG PO TABS: 400.0000 mg | ORAL_TABLET | Freq: Three times a day (TID) | ORAL | Status: AC

## 2012-09-10 MED ORDER — METOCLOPRAMIDE HCL 5 MG PO TABS: 5.0000 mg | ORAL_TABLET | Freq: Three times a day (TID) | ORAL | Status: AC | PRN

## 2012-09-10 MED ORDER — BISACODYL 5 MG PO TBEC: 10.0000 mg | DELAYED_RELEASE_TABLET | Freq: Every day | ORAL | Status: AC | PRN

## 2012-09-10 NOTE — Progress Notes (Signed)
Gave report to Delfino Lovett, RN of [facility] nursing home.  Patient understanding of discharge.  IV discontinued, patient stable at discharge.  Answered all of receiving RN's questions.  Patient with all personal belongings.

## 2012-09-10 NOTE — Discharge Summary (Signed)
Physician Discharge Summary  ATALIE OROS ZOX:[id number] DOB: [birth year] DOA: 08/20/2012  PCP: Louie Boston, MD  Admit date: 08/20/2012 Discharge date: 09/10/2012  Time spent: >30 minutes  Recommendations for Outpatient Follow-up:      Follow-up Information    Please follow up. (SNF M.D. in 1 to 2 days)        Dr. Genia Del cardiology-in 2 weeks call for appointment upon discharge.  Discharge Diagnoses:  Principal Problem:  *STEMI (ST elevation myocardial infarction) due to acute cocaine use Active Problems:  HEPATITIS C  Acute respiratory failure with hypoxia  Hypokalemia  COPD (chronic obstructive pulmonary disease)  Acute encephalopathy  Status epilepticus  Acute renal failure  Aspiration pneumonitis  Thrombocytopenia  Hypernatremia  Wernicke encephalopathy syndrome  Cirrhosis with alcoholism  Dysphagia  Chronic diastolic congestive heart failure, NYHA class 1  Urinary retention   Discharge Condition: Improved/stable  Diet recommendation: Dysphagia 3 diet with thin liquids  Filed Weights   09/06/12 2156 09/07/12 2053 09/08/12 2155  Weight: 60.873 kg (134 lb 3.2 oz) 61.689 kg (136 lb) 60.555 kg (133 lb 8 oz)    History of present illness:  Pt is a 56 year old female patient who was found unresponsive by her neighbors. O2 sat was 76% in route to the hospital despite EMS placing patient on CPAP. She has a history of polysubstance abuse including cocaine and noncompliance. Upon arrival to the emergency department at [facility], the patient was quite hypoxic and began having a seizure and was immediately intubated by the emergency room physician. Subsequent EKG revealed anterior ST segment elevation. Cardiologist on call reviewed the EKG. Because of patient's history of prior normal cardiac catheterization in 2011 and other explanatory factors for patient's abnormal EKG and altered mental status, severe hypoxia and seizure and fevers it was determined code STEMI  would not be activated. Because of need for ICU admission and patient subsequently developed recurrent seizures consistent with status epilepticus pulmonary critical care medicine except that the patient in transfer at [facility]. In the interim she was started on a Versed drip. Because of the EKG changes she was also started on IV heparin. She was also given Ativan to help suppress her seizures and was loaded with Keppra.  Additional history was obtained after admission confirming that patient had been found unresponsive with an empty oxycodone bottle next to her. Her urine drug screen was positive for cocaine after arrival. There were concerns that patient may have aspiration pneumonitis so she was briefly given antibiotics. CT of the head was negative despite  right-sided weakness which apparently is chronic. Patient eventually self extubated on 08/24/2012. She was subsequently transferred out of the ICU on 08/26/2012 [facility].    Hospital Course by problem list:  Principal Problem STEMI (ST elevation myocardial infarction) due to acute cocaine use/chest pain  -As discussed above, upon admission EKG revealed anterior ST segment elevation , cardiology was consulted and patient was not felt a candidate for acute cardiac catheterization as above . -previous catheterization June 2011 revealed no coronary artery disease  -Troponin peaked at 13 and has trended downward- initial EKG had ST elevation in anterior leads.  - likely MI precipitated by concomitant use of cocaine with associated overdose and hypoxemia  -patient has encephalopathy with significant word finding difficulty and cardiology recommend for non invasive treatment for now.  -Because of cocaine use cannot use beta blocker. Added lisinopril.  -Echocardiogram this admission shows no new regional wall motion abnormalities and demonstrates persistent apical akinesis.  -  lexiscan myoview done on 12/26, with no signs of ischemia . EF of 58%  . Cardiology signed off . She is to followup outpatient with cardiology. -Patient again with chest pain 12/30,  cardiac enzymes were cycled and came back negative -as above recent Myoview negative. This might have been GERD related,she also had associated nausea vomiting-which are now resolved  following PPI, and  Reglan when necessary. Active Problems:  Acute encephalopathy  *Acute encephalopathy likely from the above-stated metabolic issues  Patent was admitted at [facility] in September with diagnosis of wernicke's encephalopathy. An MRI obtained at that time was suggestive Posterior reversible encephalopathy syndrome( PRES). apparently she got better on discharge.  -repeat MRI showed Improved gray-white matter signal in the brain compared to previous study. . Residual signal abnormality in the left thalamus suggests possible seizure vs ischemia.  - Seen by neurology who recommends to continue Keppra for now.  -Continue thiamine and folate.  -appreciate psych recs. No suicidal ideation.  -[name] was discontinued however had to be placed back because of fall risk.  -needs SNF on discharge however having fall risk and need of a sitter has prolong hospital stay.  Social work to assist with placement.  -patient has been more alert and appropriate. ? Suicide/drug overdose -The patient was seen by psychiatry and cleared for discharge to nursing facility. Dr Ferol Luz recommended keeping patient on Cymbalta. Urinary retention- since 12/23  Noted for significant urinary retention on bladder scan requiring in and out catheterization. Placed a foley. UA unremarkable.  -She improved and Foley catheter was DC'd on 12/26 -Resolved, has been able to void continuously. Acute respiratory failure with hypoxia/ Aspiration pneumonitis  *Multifactorial secondary to narcotic overdose with resultant hypoxemia and altered mentation requiring intubation  *Likely also has some aspiration pneumonitis . patient has completed a  course of [facility] hospital, would not require any further antibiotics upon discharge. AKI  Likely prerenal with hypoperfusion. Now resolved  Thrombocytopenia  Resolved  Status epilepticus  Possibly substance induced seizure. No seizure activity since 48 hrs post admission  Cont keppra, follow up outpatient.  Hep /Cirrhosis with alcoholism  *Cirrhotic changes seen on previous imaging but total bilirubin normal  Dysphagia  On dys level 3 diet  Hypokalemia  *Resolved  COPD (chronic obstructive pulmonary disease)  *Stable .she is to continue bronchodilators upon discharge. Hypernatremia  *Resolved with hydration  GERD  Cont PPI and maalox  Chronic diastolic congestive heart failure, NYHA class 1  Stable, compensated.  DVT prophylaxis  ABD. Pain  - likely secondary to constipation, now resolved. -lipase nl, follow. Recent abd x-ray neg  consults PCCM  cardiology  psych  Neurology    Procedures:  Myoview on 12/26-negative  2-D echocardiogram on 12/11  Study Conclusions  Left ventricle: The cavity size was normal. Wall thickness was normal. Systolic function was normal. The estimated ejection fraction was in the range of 50% to 55%. Akinesis of the apical myocardium. Doppler parameters are consistent with abnormal left ventricular relaxation (grade 1 diastolic dysfunction). Transthoracic echocardiography.   endotracheal intubation 12/10 OETT >>> 12/14  Discharge Exam: Filed Vitals:   09/09/12 2100 09/09/12 2123 09/10/12 0513 09/10/12 0838  BP:  106/64 112/79 133/80  Pulse:  74 93   Temp: 98.2 F (36.8 C) 98.2 F (36.8 C) 98.5 F (36.9 C) 97.9 F (36.6 C)  TempSrc:  Oral Oral Oral  Resp:  16 16 18   Height:      Weight:      SpO2:  100% 94%  99%   Exam:  General: middle aged female In NAD.  HEENT: no pallor, moist oral mucosa  Cardiovascular: NS1 & S2, no murmurs  Respiratory: Clear to auscultation bilaterally  Abdomen: soft, nontender,ND, BS+  Ext: warm  no edema  CNS: alertand oriented x2,anwers appropriately    Discharge Instructions  Discharge Orders    Future Orders Please Complete By Expires   Diet - low sodium heart healthy      DIET DYS 3      Comments:   With thin liquids   Increase activity slowly          Medication List     As of 09/10/2012  1:11 PM    STOP taking these medications         aspirin EC 81 MG tablet      carbamazepine 200 MG 12 hr tablet   Commonly known as: TEGRETOL XR      HYDROcodone-acetaminophen 5-325 MG per tablet   Commonly known as: NORCO/VICODIN      meloxicam 7.5 MG tablet   Commonly known as: MOBIC      nystatin 100000 UNIT/ML suspension   Commonly known as: MYCOSTATIN      PARoxetine 40 MG tablet   Commonly known as: PAXIL      TAKE these medications         acetaminophen 500 MG tablet   Commonly known as: TYLENOL   Take 500 mg by mouth every 6 (six) hours as needed. pain      albuterol (5 MG/ML) 0.5% nebulizer solution   Commonly known as: PROVENTIL   Take 0.5 mLs (2.5 mg total) by nebulization every 4 (four) hours as needed for wheezing or shortness of breath.      PROAIR HFA 108 (90 BASE) MCG/ACT inhaler   Generic drug: albuterol   Inhale 2 puffs into the lungs every 6 (six) hours as needed. Shortness of breath      ALPRAZolam 1 MG tablet   Commonly known as: XANAX   Take 1 tablet (1 mg total) by mouth 3 (three) times daily before meals.      amitriptyline 25 MG tablet   Commonly known as: ELAVIL   Take 1 tablet (25 mg total) by mouth at bedtime.      aspirin 325 MG tablet   Take 1 tablet (325 mg total) by mouth daily.      bisacodyl 5 MG EC tablet   Commonly known as: DULCOLAX   Take 2 tablets (10 mg total) by mouth daily as needed (1st now).      budesonide-formoterol 160-4.5 MCG/ACT inhaler   Commonly known as: SYMBICORT   Inhale 2 puffs into the lungs 2 (two) times daily.      carbamazepine 200 MG tablet   Commonly known as: TEGRETOL   Take 2  tablets (400 mg total) by mouth 3 (three) times daily.      celecoxib 100 MG capsule   Commonly known as: CELEBREX   Take 1 capsule (100 mg total) by mouth 2 (two) times daily.      cetirizine 10 MG tablet   Commonly known as: ZYRTEC   Take 10 mg by mouth daily.      DULoxetine 30 MG capsule   Commonly known as: CYMBALTA   Take 1 capsule (30 mg total) by mouth 2 (two) times daily.      feeding supplement Liqd   Take 237 mLs by mouth 2 (two) times daily between meals.  folic acid 1 MG tablet   Commonly known as: FOLVITE   Take 1 tablet (1 mg total) by mouth daily.      gabapentin 100 MG capsule   Commonly known as: NEURONTIN   Take 1 capsule (100 mg total) by mouth 3 (three) times daily.      ipratropium-albuterol 0.5-2.5 (3) MG/3ML Soln   Commonly known as: DUONEB   Take 3 mLs by nebulization 2 (two) times daily.      levETIRAcetam 1000 MG tablet   Commonly known as: KEPPRA   Take 1 tablet (1,000 mg total) by mouth 2 (two) times daily.      lidocaine 5 %   Commonly known as: LIDODERM   Place 4 patches onto the skin every 12 (twelve) hours. Remove & Discard patch within 12 hours or as directed by MD place over pain sites. USE tape on all four sides      lisinopril 20 MG tablet   Commonly known as: PRINIVIL,ZESTRIL   Take 1 tablet (20 mg total) by mouth daily.      metoCLOPramide 5 MG tablet   Commonly known as: REGLAN   Take 1 tablet (5 mg total) by mouth 3 (three) times daily as needed.      metoprolol 50 MG tablet   Commonly known as: LOPRESSOR   Take 0.5 tablets (25 mg total) by mouth 2 (two) times daily.      ondansetron 4 MG disintegrating tablet   Commonly known as: ZOFRAN-ODT   Take 1 tablet (4 mg total) by mouth every 6 (six) hours as needed for nausea.      pantoprazole 40 MG tablet   Commonly known as: PROTONIX   Take 1 tablet (40 mg total) by mouth 2 (two) times daily before a meal.      thiamine 100 MG tablet   Take 1 tablet (100 mg total) by  mouth daily.      tiotropium 18 MCG inhalation capsule   Commonly known as: SPIRIVA   Place 1 capsule (18 mcg total) into inhaler and inhale daily.           Follow-up Information    Please follow up. (SNF M.D. in 1 to 2 days)           The results of significant diagnostics from this hospitalization (including imaging, microbiology, ancillary and laboratory) are listed below for reference.    Significant Diagnostic Studies: Ct Head Wo Contrast  08/23/2012  *RADIOLOGY REPORT*  Clinical Data: Right-sided weakness.  Rule out CVA  CT HEAD WITHOUT CONTRAST  Technique:  Contiguous axial images were obtained from the base of the skull through the vertex without contrast.  Comparison: CT head 08/20/2012  Findings: Ventricle size is normal.  Negative for intracranial hemorrhage or mass.  Mild hypointensity in the sub insular white matter bilaterally is unchanged and has the appearance of chronic microvascular ischemia. No acute infarct identified.  Calvarium is intact.  IMPRESSION: No acute abnormality.   Original Report Authenticated By: Janeece Riggers, M.D.    Ct Head Wo Contrast  08/20/2012  *RADIOLOGY REPORT*  Clinical Data: Seizure  CT HEAD WITHOUT CONTRAST  Technique:  Contiguous axial images were obtained from the base of the skull through the vertex without contrast.  Comparison: 05/27/2012  Findings: Left density in the peripheral right frontal lobe on image 19 is felt to be related to signal averaging.  No mass effect, midline shift, or acute intracranial hemorrhage. Ventricles system is stable in appearance and  size.  No new suspicious area of low density to suggest interval acute ischemic change.  Minimal chronic ischemic changes in the periventricular white matter.  Mastoid air cells and visualized paranasal sinuses are clear.  IMPRESSION: No acute intracranial pathology.   Original Report Authenticated By: Jolaine Click, M.D.    Mr Brain Wo Contrast  08/29/2012  *RADIOLOGY REPORT*   Clinical Data: 56 year old female found unresponsive on 08/20/2012, possible drug overdose. Seizure, controlled status epilepticus at time of admission.  Abnormal MRI in September.  MRI HEAD WITHOUT CONTRAST  Technique:  Multiplanar, multiecho pulse sequences of the brain and surrounding structures were obtained according to standard protocol without intravenous contrast.  Comparison: Head CTs 08/23/2012 and earlier.  Brain MRI 05/27/2012.  Findings: Diffusion signal appears to be abnormal in the left cingulate gyrus, no definite involvement on the right. More subtle abnormal asymmetric diffusion signal in the left superior frontal gyrus (series 3 image 20).  There is no associated T2 or FLAIR hyperintensity in these areas.  There is mildly abnormal diffusion signal in the medial left thalamus.  No other areas of restricted diffusion.  No convincing major vascular territory infarct.  Improved gray and white matter signal in the brain since September. Most of the abnormal signal in the periphery of the brain has resolved. No cortical encephalomalacia has developed.  There is some residual T2 and FLAIR hyperintensity in the medial left thalamus corresponding to the diffusion finding today.  Signal in the right thalamus has normalized.  Brainstem and cerebellum are within normal limits.  Major intracranial vascular flow voids are preserved.  No ventriculomegaly. No midline shift, mass effect, or evidence of mass lesion.  No acute intracranial hemorrhage identified. Negative pituitary, cervicomedullary junction and visualized cervical spine.  Normal bone marrow signal.  Visualized orbit soft tissues are within normal limits.  Visualized paranasal sinuses and mastoids are clear.  Negative scalp soft tissues.  IMPRESSION: 1.  Improved gray-white matter signal in the brain compared to the abnormal September study.  Residual signal abnormality in the left thalamus. 2.  New low level abnormal diffusion signal in the left  cingulate gyrus and some cortex at the anterior left superior frontal gyrus. 3.  The findings are nonspecific and the new signal abnormality might relate to recent seizure activity or mild ischemia.   A viral or atypical encephalitis is felt less likely given the fairly unilateral involvement.   Original Report Authenticated By: Erskine Speed, M.D.    Nm Myocar Multi W/spect W/wall Motion / Ef  09/05/2012  Lexiscan myovue:  Indication: Chest Pain  Patient received .4 mg of lexiscan as a bolus.  Resting ECG normal. HR increased from 80 to 123.  BP stable 127/64 mmHg.  No chest pain or dyspnea.  No ischemic changes  Images reconstructed in the vertical horizontal and short axis planes.  No ischemia or infarct.  QPS was 0  Surface images normal with EF 58% and no RWMA;s  Impression:  Normal Lexiscan myovue EF 58%  Charlton Haws MD [facility]   Original Report Authenticated By: Charlton Haws, M.D.    Dg Chest Port 1 View  08/24/2012  *RADIOLOGY REPORT*  Clinical Data: Evaluate endotracheal tube, lines  PORTABLE CHEST - 1 VIEW  Comparison: Most recent prior chest x-ray 08/23/2012  Findings: The tip of the endotracheal tube is 5.4 cm above the carina.  Incompletely imaged nasogastric tube appears similar to prior.  The tip lies below the diaphragm, likely within the stomach.  Given slight  differences in technique, no significant interval change in the appearance of the lungs with bilateral interstitial and airspace opacities in the mid and lower lung zones.  Stable cardiomegaly.  IMPRESSION:  1.  Stable and satisfactory support apparatus. 2.  No significant interval change in the appearance of bilateral air space opacities and mild cardiomegaly.   Original Report Authenticated By: Malachy Moan, M.D.    Dg Chest Port 1 View  08/23/2012  *RADIOLOGY REPORT*  Clinical Data: Respiratory difficulty  PORTABLE CHEST - 1 VIEW  Comparison: Yesterday  Findings: Endotracheal tube, NG tube are stable.  Upper normal heart size.   Diffuse heterogeneous opacities have improved with some residual airspace disease at the lung bases.  No pneumothorax.  IMPRESSION: Improving bilateral airspace disease.   Original Report Authenticated By: Jolaine Click, M.D.    Dg Chest Port 1 View  08/22/2012  *RADIOLOGY REPORT*  Clinical Data: Assess ET tube  PORTABLE CHEST - 1 VIEW  Comparison: 08/20/2012  Findings: ET tube 4.3 cm above carina.  Worsening aeration with bilateral lower lobe opacities.  Slight clearing right upper lobe infiltrate.  No pneumothorax.  Osteopenia.  Normal heart size.  IMPRESSION: ET tube 4.3 cm above carina.  Worsening aeration with bilateral lower lobe opacities now developing.   Original Report Authenticated By: Davonna Belling, M.D.    Dg Chest Port 1 View  08/20/2012  *RADIOLOGY REPORT*  Clinical Data: Short of breath ET tube placement  PORTABLE CHEST - 1 VIEW  Comparison: 05/30/2012  Findings: Endotracheal tube tip is above the carina.  There is a nasogastric tube which is looped in the stomach.  Heart size is normal.  Airspace consolidation is identified within the right upper lobe.  The left lung is clear.  Visualized osseous structures are unremarkable.  IMPRESSION:  1.  Right upper lobe airspace disease consistent with pneumonia. 2.  Advise follow-up imaging to ensure resolution. 3.  Satisfactory position of nasogastric tube and ET tube.   Original Report Authenticated By: Signa Kell, M.D.    Dg Abd Portable 1v  09/08/2012  *RADIOLOGY REPORT*  Clinical Data: Abdominal pain.  PORTABLE ABDOMEN - 1 VIEW  Comparison: 06/01/2012  Findings: No evidence of dilated bowel loops.  Large colonic stool burden is noted.  Cholecystectomy clips seen in the right upper quadrant as well as lower lumbar spine fusion hardware.  IMPRESSION:  1.  No acute findings. 2.  Large stool burden noted.   Original Report Authenticated By: Myles Rosenthal, M.D.    Dg Swallowing Func-speech Pathology  08/27/2012  [name] Deblois, CCC-SLP      08/27/2012  3:37 PM Objective Swallowing Evaluation: Modified Barium Swallowing Study   Patient Details  Name: ALFHILD PARTCH MRN: [id number] Date of Birth: [birth year]  Today's Date: 08/27/2012 Time: 1303-1330 SLP Time Calculation (min): 27 min  Past Medical History:  Past Medical History  Diagnosis Date  . Coronary artery disease   . Degenerative disc disease   . Asthma   . Lupus   . Hepatitis C   . Anxiety   . Obsessive-compulsive disorder   . Depression   . PTSD (post-traumatic stress disorder)   . Seizures   . Polysubstance abuse    Past Surgical History:  Past Surgical History  Procedure Date  . Cholecystectomy   . Appendectomy   . Tonsillectomy   . Tubal ligation   . Polyps on vocal cords   . Cardiac catheterization   . Esophagogastroduodenoscopy 06/02/2012    Procedure: ESOPHAGOGASTRODUODENOSCOPY (EGD);  Surgeon: Malissa Hippo, MD;  Location: [facility];  Service: Endoscopy;   Laterality: N/A;  . 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   Seizure en route.  Intubated for airway protection.  Drug screen  positive for cocaine.  STEMI, seen by Cardiology, not a candidate  for emergent cardiac catheterization.  Heparin completed.  ASA  continued.  Beta blockers / statins contraindicated.  Antibiotics  d/c'd as more likely pneumonitis than pneumonia.  Self extubated  12/14, doing well.treat agitation, benzo WD?  PT OT reports  concern for right sided weakness prior to SLp swallow eval. MD  agreed to cognitive linguistic eval.      Assessment / Plan / Recommendation Clinical Impression  Clinical impression: Pt presents with moderate sensory based  oropharyngeal deficits leading to delayed initiation of swallow  response and silent aspiration. Oral function is likely at  baseline though pt struggled to consume solids due to cognitive  impairment and poor dentition. Pharyngeal swallow characterized  by delay in swallow initation to the pyriform  sinuses with silent  aspiration of thin liquids and in one instance nectar thick  liquids. The pts intake is impulsive. With small controlled sips  the pt may tolerate thin liquids but at this time would be at  high risk of aspiration due to cognitive deficits and inability  to follow precautions/strategies. Recommend a dys 3 (mechanical  soft) diet with nectar thick liquids.     Treatment Recommendation  Therapy as outlined in treatment plan below    Diet Recommendation Dysphagia 3 (Mechanical Soft);Nectar-thick  liquid   Liquid Administration via: Cup;No straw Medication Administration: Whole meds with puree Supervision: Patient able to self feed;Full supervision/cueing  for compensatory strategies Compensations: Slow rate;Small sips/bites;Clear throat  intermittently Postural Changes and/or Swallow Maneuvers: Seated upright 90  degrees;Upright 30-60 min after meal    Other  Recommendations Oral Care Recommendations: Oral care BID Other Recommendations: Order thickener from pharmacy   Follow Up Recommendations  Inpatient Rehab    Frequency and Duration min 2x/week  2 weeks   Pertinent Vitals/Pain NA    SLP Swallow Goals Patient will consume recommended diet without observed clinical  signs of aspiration with: Minimal assistance Patient will utilize recommended strategies during swallow to  increase swallowing safety with: Minimal cueing   General HPI: 56 yo found unresponsive next to empty Oxychodone  bottle.  Seizure en route.  Intubated for airway protection.   Drug screen positive for cocaine.  STEMI, seen by Cardiology, not  a candidate for emergent cardiac catheterization.  Heparin  completed.  ASA continued.  Beta blockers / statins  contraindicated.  Antibiotics d/c'd as more likely pneumonitis  than pneumonia.  Self extubated 12/14, doing well.treat  agitation, benzo WD?  PT OT reports concern for right sided  weakness prior to SLp swallow eval. MD agreed to cognitive  linguistic eval.  Type of Study:  Modified Barium Swallowing Study Reason for Referral: Objectively evaluate swallowing function Diet Prior to this Study: NPO Respiratory Status: Supplemental O2 delivered via (comment) History of Recent Intubation: Yes Length of Intubations (days): 2 days Date extubated: 08/24/12 Behavior/Cognition: Alert;Cooperative;Confused;Pleasant mood Oral Cavity - Dentition: Missing dentition Oral Motor / Sensory Function: Within functional limits Self-Feeding Abilities: Able to feed self Patient Positioning: Upright in bed Baseline Vocal Quality: Clear Volitional Cough: Weak Volitional Swallow: Able to elicit Anatomy: Within functional limits Pharyngeal Secretions: Not observed secondary MBS  Reason for Referral Objectively evaluate swallowing function   Oral Phase Oral Preparation/Oral Phase Oral Phase: Impaired Oral - Nectar Oral - Nectar Cup: Within functional limits Oral - Thin Oral - Thin Cup: Within functional limits Oral - Thin Straw: Within functional limits Oral - Solids Oral - Puree: Within functional limits Oral - Mechanical Soft: Other (Comment) (attempted but pt sucked  on cracker)   Pharyngeal Phase Pharyngeal Phase Pharyngeal Phase: Impaired Pharyngeal - Nectar Pharyngeal - Nectar Cup: Delayed swallow initiation;Pharyngeal  residue - valleculae;Pharyngeal residue - pyriform sinuses;Trace  aspiration;Penetration/Aspiration after swallow Penetration/Aspiration details (nectar cup): Material does not  enter airway;Material enters airway, remains ABOVE vocal cords  then ejected out;Material enters airway, passes BELOW cords  without attempt by patient to eject out (silent aspiration) Pharyngeal - Thin Pharyngeal - Thin Cup: Delayed swallow  initiation;Penetration/Aspiration before swallow;Pharyngeal  residue - pyriform sinuses;Moderate aspiration Penetration/Aspiration details (thin cup): Material enters  airway, passes BELOW cords without attempt by patient to eject  out (silent aspiration);Material does not  enter airway Pharyngeal - Thin Straw: Delayed swallow  initiation;Penetration/Aspiration before swallow;Pharyngeal  residue - pyriform sinuses;Moderate aspiration Penetration/Aspiration details (thin straw): Material enters  airway, passes BELOW cords without attempt by patient to eject  out (silent aspiration);Material does not enter airway Pharyngeal - Solids Pharyngeal - Puree: Delayed swallow initiation  Cervical Esophageal Phase    GO    Cervical Esophageal Phase Cervical Esophageal Phase: Impaired Cervical Esophageal Phase - Comment Cervical Esophageal Comment: Appearance of osteophyte at C6/7  impacting opening of UES. Mild backflow to pyriforms from UES  post swallow.         Harlon Ditty, [place] CCC-SLP [phone]  Claudine Mouton 08/27/2012, 3:36 PM      Microbiology: No results found for this or any previous visit (from the past 240 hour(s)).   Labs: Basic Metabolic Panel:  Lab 09/09/12 0865 09/04/12 0510  NA 137 138  K 3.2* 4.0  CL 97 102  CO2 25 28  GLUCOSE 110* 91  BUN 9 15  CREATININE 0.36* 0.64  CALCIUM 9.2 9.2  MG -- --  PHOS -- --   Liver Function Tests: No results found for this basename: AST:5,ALT:5,ALKPHOS:5,BILITOT:5,PROT:5,ALBUMIN:5 in the last 168 hours  Lab 09/08/12 1300  LIPASE 29  AMYLASE --   No results found for this basename: AMMONIA:5 in the last 168 hours CBC:  Lab 09/08/12 2211 09/04/12 0510  WBC 5.0 4.1  NEUTROABS 2.6 --  HGB 14.1 13.7  HCT 43.5 42.3  MCV 93.8 94.0  PLT 208 225   Cardiac Enzymes:  Lab 09/09/12 2023 09/09/12 1154 09/09/12 0930  CKTOTAL -- -- --  CKMB -- -- --  CKMBINDEX -- -- --  TROPONINI <0.30 <0.30 <0.30   BNP: BNP (last 3 results)  Basename 04/23/12 0327 04/18/12 1924  PROBNP 19352.0* 16764.0*   CBG:  Lab 09/03/12 1722  GLUCAP 125*       Signed:  Samay Delcarlo C  [facility] 09/10/2012, 1:11 PM

## 2012-09-10 NOTE — Progress Notes (Signed)
Physical Therapy Treatment Patient Details Name: Donna [name] MRN: [id number] DOB: 04/23/1956 Today's Date: 09/10/2012 Time: 0920-0933 PT Time Calculation (min): 13 min  PT Assessment / Plan / Recommendation Comments on Treatment Session  Pt admitted s/p unresponsive episode with STEMI and right sided weakness. Pt motivated and continues to progress. Limited by decreased safety awareness and right inattention.    Follow Up Recommendations  SNF     Does the patient have the potential to tolerate intense rehabilitation     Barriers to Discharge        Equipment Recommendations  Rolling walker with 5" wheels    Recommendations for Other Services    Frequency Min 3X/week   Plan Discharge plan remains appropriate;Frequency remains appropriate    Precautions / Restrictions Precautions Precautions: Fall Restrictions Weight Bearing Restrictions: No   Pertinent Vitals/Pain None    Mobility  Bed Mobility Bed Mobility: Not assessed Transfers Transfers: Sit to Stand;Stand to Sit Sit to Stand: 4: Min assist;With upper extremity assist;From chair/3-in-1 Stand to Sit: 4: Min assist;With upper extremity assist;To chair/3-in-1 Details for Transfer Assistance: Assist for safety and balance with cues for tall posture and safe sequence. Ambulation/Gait Ambulation/Gait Assistance: 4: Min assist Ambulation Distance (Feet): 300 Feet (150 feet x 2 trials.) Assistive device: Rolling walker Ambulation/Gait Assistance Details: Assist for balance and safety with RW. Pt impulsive and inattentive to right side. Assist to avoid obstacles and problem solve safety while ambulating. Constant LOB due to decreased safety, awareness of deficits, weakness, and right inattention. Gait Pattern: Step-through pattern;Decreased stride length;Scissoring;Ataxic;Narrow base of support Stairs: No Wheelchair Mobility Wheelchair Mobility: No    Exercises     PT Diagnosis:    PT Problem List:   PT Treatment  Interventions:     PT Goals Acute Rehab PT Goals PT Goal Formulation: Patient unable to participate in goal setting Time For Goal Achievement: 09/17/12 Potential to Achieve Goals: Fair PT Goal: Sit to Stand - Progress: Progressing toward goal PT Goal: Stand to Sit - Progress: Progressing toward goal PT Goal: Ambulate - Progress: Progressing toward goal  Visit Information  Last PT Received On: 09/10/12 Assistance Needed: +1    Subjective Data  Subjective: "I hope I can go home soon." Patient Stated Goal: did not state   Cognition  Overall Cognitive Status: Impaired Area of Impairment: Memory;Following commands;Safety/judgement;Problem solving;Attention Arousal/Alertness: Awake/alert Orientation Level: Disoriented to;Time;Situation Behavior During Session: [facility] for tasks performed Current Attention Level: Sustained Attention - Other Comments: Requiring redirection to task in busy environment. Memory Deficits: Decreased recall of last PT session. Following Commands: Follows multi-step commands inconsistently Safety/Judgement: Impulsive;Decreased safety judgement for tasks assessed;Decreased awareness of need for assistance Safety/Judgement - Other Comments: Very impulsive and inattention to rt side Awareness of Deficits: Patient with difficulty telling PT why she is in hospital Problem Solving: Pt with difficulty problem solving manuevering around obstacles.    Balance  Balance Balance Assessed: No  End of Session PT - End of Session Equipment Utilized During Treatment: Gait belt Activity Tolerance: Patient tolerated treatment well Patient left: in chair;with call bell/phone within reach Nurse Communication: Mobility status   GP     Cephus Shelling 09/10/2012, 9:38 AM  09/10/2012 Cephus Shelling, PT, DPT [phone]

## 2012-09-10 NOTE — Progress Notes (Signed)
Occupational Therapy Treatment Patient Details Name: Donna [name] MRN: [id number] DOB: 1956/04/29 Today's Date: 09/10/2012 Time: 1191-4782 OT Time Calculation (min): 31 min  OT Assessment / Plan / Recommendation Comments on Treatment Session Pt continues to demonstrate decreased safety awareness and was at one point attempting to reach out of her bed down to the floor to pick something up.  Therapist had to keep pt from falling out of bed and re-direct her to wait for someone to be next to her to attempt getting up or reaching the floor.  She continues to need overall min assist for balance with selfcare tasks as well and still demonstrates ataxia and decreased coordination in the right hand..  Will continue to benefit from short term CIR therapies.    Follow Up Recommendations  CIR       Equipment Recommendations  None recommended by OT       Frequency Min 2X/week      Precautions / Restrictions Precautions Precautions: Fall Precaution Comments: impulsive, decreased judgement Restrictions Weight Bearing Restrictions: No   Pertinent Vitals/Pain HR 90-113 with activity, BP 135/75 in sitting    ADL  Grooming: Performed;Wash/dry hands;Wash/dry face;Brushing hair;Minimal assistance Where Assessed - Grooming: Supported standing Toilet Transfer: Performed;Minimal Dentist Method: Other (comment) (ambulate without assistive device) Acupuncturist: Materials engineer and Hygiene: Simulated;Minimal assistance Where Assessed - Toileting Clothing Manipulation and Hygiene: Sit to stand from 3-in-1 or toilet Transfers/Ambulation Related to ADLs: Pt able to ambulate with min assist level without assistive device but exhibits LOB. ADL Comments: Pt able to stand with min assist for grooming tasks.  Min instructional cueing to use the LUE for brushing her hair.  After grooming tasks ambulated in the hall with focus on balance and  visual scanning to locate items in the hallway.  Pt able to identify approximately 80% of items on the right side and left side.  No noted descrepency of missing items consistently on one side vs the other.    OT Goals ADL Goals ADL Goal: Grooming - Progress: Progressing toward goals ADL Goal: Toilet Transfer - Progress: Progressing toward goals ADL Goal: Toileting - Clothing Manipulation - Progress: Progressing toward goals ADL Goal: Toileting - Hygiene - Progress: Progressing toward goals ADL Goal: Additional Goal #1 - Progress: Progressing toward goals  Visit Information  Last OT Received On: 09/10/12 Assistance Needed: +1    Subjective Data  Subjective: I think my foam grip fell on the floor. Patient Stated Goal: Did not state but agreed to stand at the sink for grooming tasks.      Cognition  Overall Cognitive Status: Impaired Area of Impairment: Attention;Safety/judgement Orientation Level: Time Safety/Judgement - Other Comments: Pt impulsive attempting to get item on the floor whole laying in the bed.  Therapist had to keep pt from attempting to get out of bed to get the item.    Mobility  Shoulder Instructions Bed Mobility Bed Mobility: Supine to Sit Supine to Sit: 5: Supervision;HOB flat Sit to Supine: 5: Supervision;HOB flat Transfers Transfers: Sit to Stand Sit to Stand: 4: Min assist;Without upper extremity assist;From bed          Balance Balance Balance Assessed: Yes Static Sitting Balance Static Sitting - Balance Support: No upper extremity supported Static Sitting - Level of Assistance: 4: Min assist   End of Session OT - End of Session Equipment Utilized During Treatment: Gait belt Activity Tolerance: Patient tolerated treatment well Patient left: in bed;with call bell/phone  within reach;with bed alarm set     [facility] OTR/L Pager number [phone] 09/10/2012, 2:56 PM

## 2012-09-10 NOTE — Clinical Social Work Note (Signed)
CSW was consulted to complete discharge of patient. Pt to transfer to [facility] today via [place]. Facility and family are aware of d/c. D/C packet complete with chart copy, signed FL2.  CSW signing off as no other CSW needs identified at this time.  Lia Foyer, LCSWA [facility] Clinical Social Worker Contact #: [phone] (PRN)

## 2013-01-29 ENCOUNTER — Ambulatory Visit: Payer: Medicare Other | Admitting: Neurology

## 2013-12-03 ENCOUNTER — Other Ambulatory Visit (HOSPITAL_COMMUNITY): Payer: Self-pay | Admitting: Nurse Practitioner

## 2013-12-03 DIAGNOSIS — R439 Unspecified disturbances of smell and taste: Secondary | ICD-10-CM

## 2013-12-10 ENCOUNTER — Ambulatory Visit (HOSPITAL_COMMUNITY): Payer: Medicare Other

## 2013-12-17 ENCOUNTER — Encounter (HOSPITAL_COMMUNITY): Payer: Self-pay

## 2013-12-17 ENCOUNTER — Ambulatory Visit (HOSPITAL_COMMUNITY)
Admission: RE | Admit: 2013-12-17 | Discharge: 2013-12-17 | Disposition: A | Discharge status: Home / Self Care | Payer: Medicare Other | Source: Ambulatory Visit | Attending: Nurse Practitioner | Admitting: Nurse Practitioner

## 2013-12-17 DIAGNOSIS — G9389 Other specified disorders of brain: Secondary | ICD-10-CM | POA: Insufficient documentation

## 2013-12-17 DIAGNOSIS — R439 Unspecified disturbances of smell and taste: Secondary | ICD-10-CM | POA: Insufficient documentation

## 2013-12-17 DIAGNOSIS — F29 Unspecified psychosis not due to a substance or known physiological condition: Secondary | ICD-10-CM | POA: Insufficient documentation

## 2013-12-17 DIAGNOSIS — R29898 Other symptoms and signs involving the musculoskeletal system: Secondary | ICD-10-CM | POA: Insufficient documentation

## 2013-12-17 DIAGNOSIS — I6789 Other cerebrovascular disease: Secondary | ICD-10-CM | POA: Insufficient documentation

## 2014-01-12 IMAGING — CT CT HEAD W/O CM
1 of 2 series · 16 of 30 positions shown, 20 images · non-contrast
Comparison: 05/27/2012

CLINICAL DATA: Seizure

CT HEAD WITHOUT CONTRAST
TECHNIQUE: Contiguous axial images were obtained from the base of
the skull through the vertex without contrast.

[Series 2: headseq 4.8 h37s · axial · 0.43mm/px · z∈[+1295,+1452]mm · 16 of 36 slices shown, 20 images]
[im 2/36  brain]
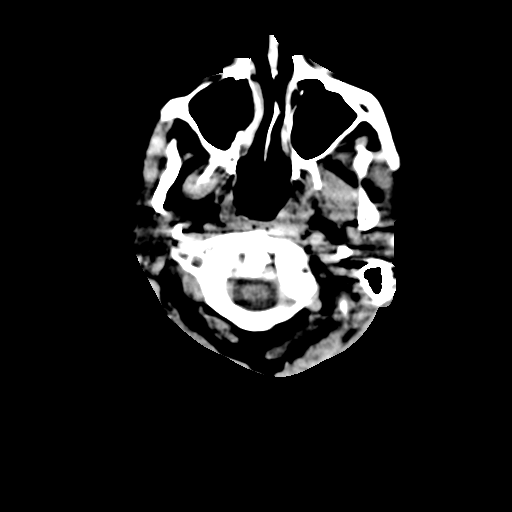
[im 2/36  bone]
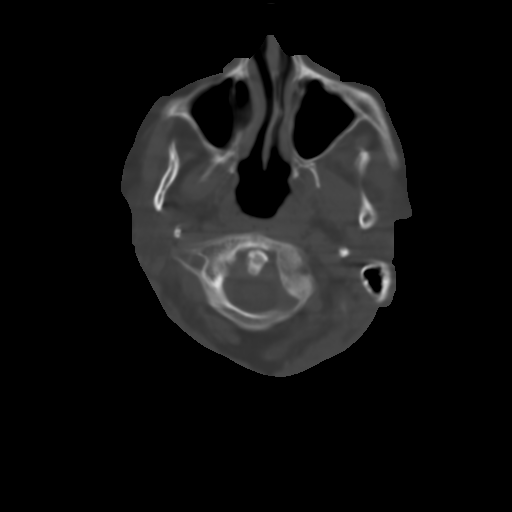
[im 4/36  brain]
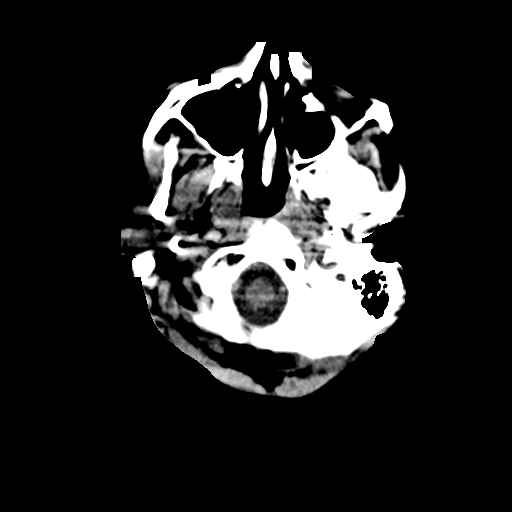
[im 7/36  brain]
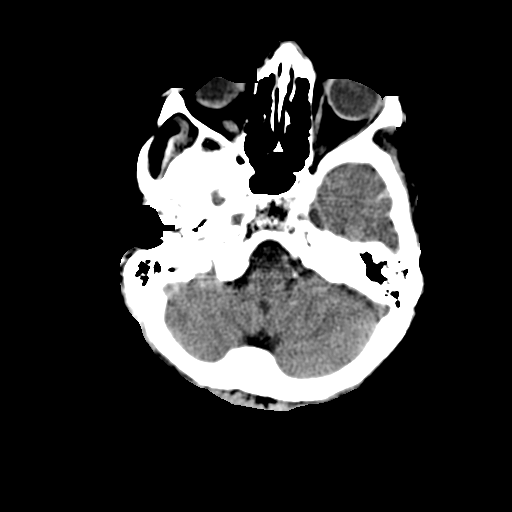
[im 9/36  brain]
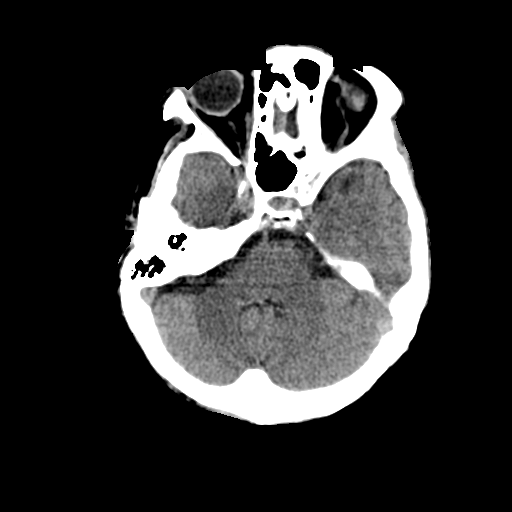
[im 11/36  brain]
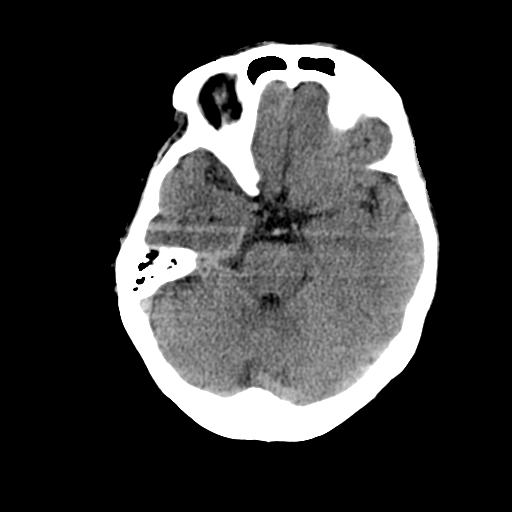
[im 11/36  bone]
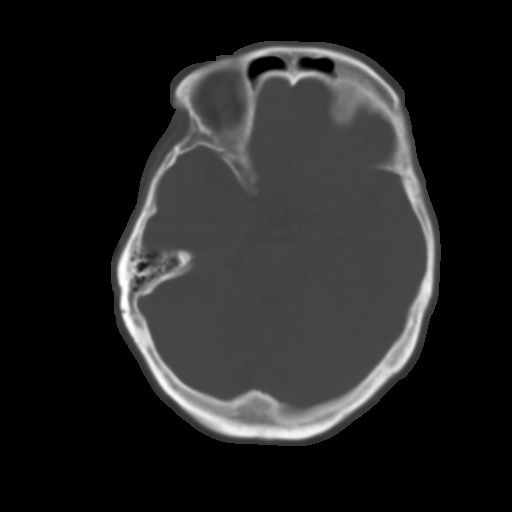
[im 12/36  brain]
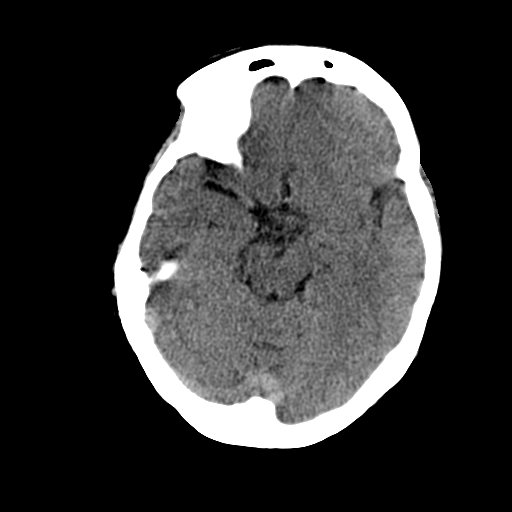
[im 16/36  brain]
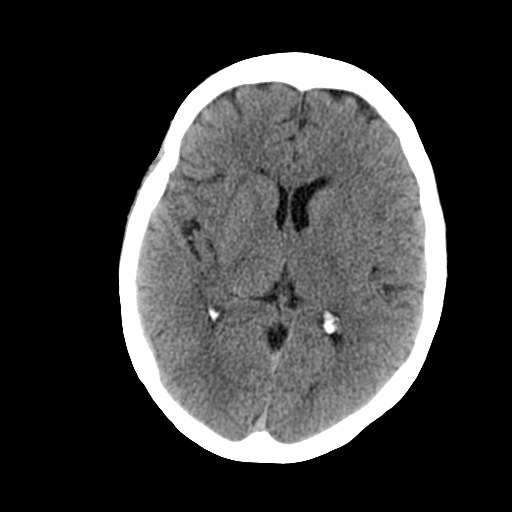
[im 17/36  brain]
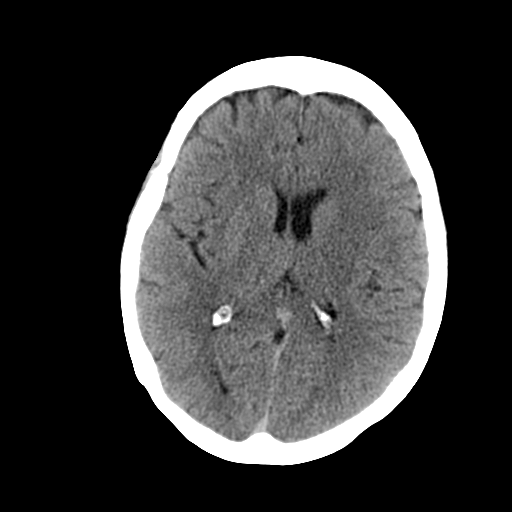
[im 19/36  brain]
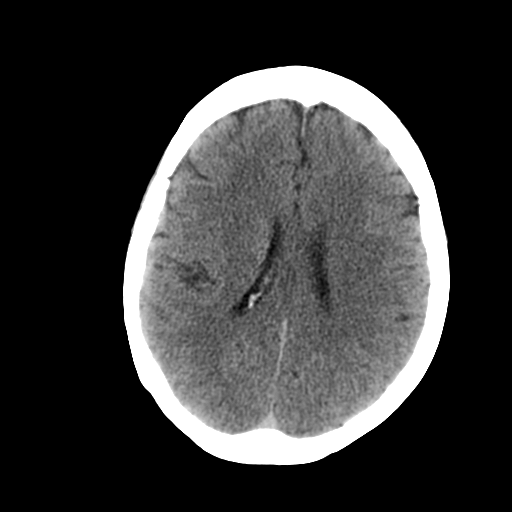
[im 19/36  bone]
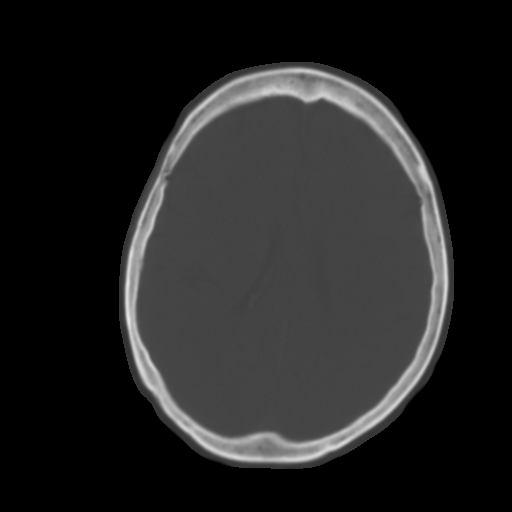
[im 21/36  brain]
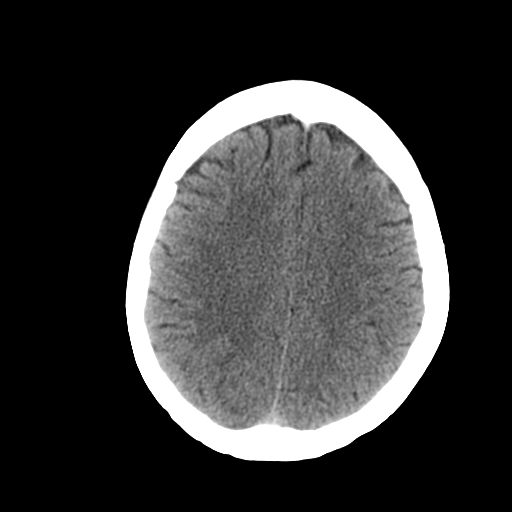
[im 24/36  brain]
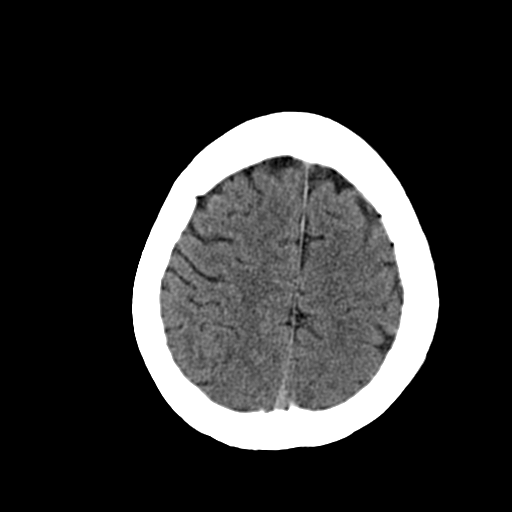
[im 26/36  brain]
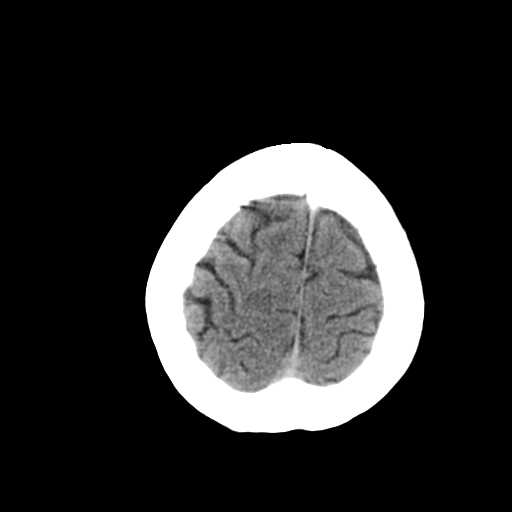
[im 27/36  brain]
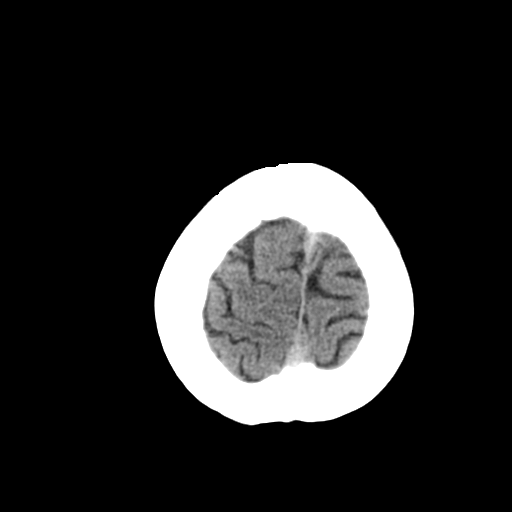
[im 27/36  bone]
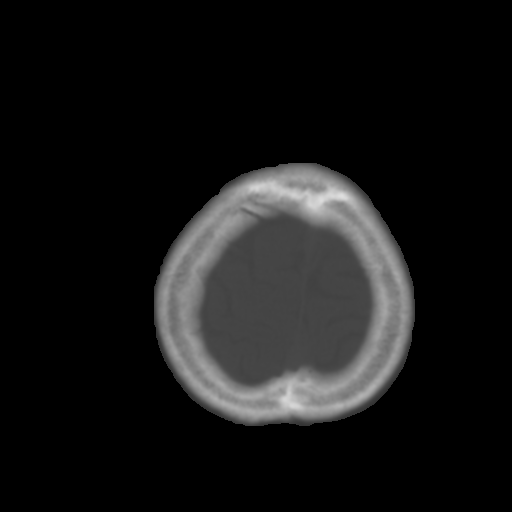
[im 29/36  brain]
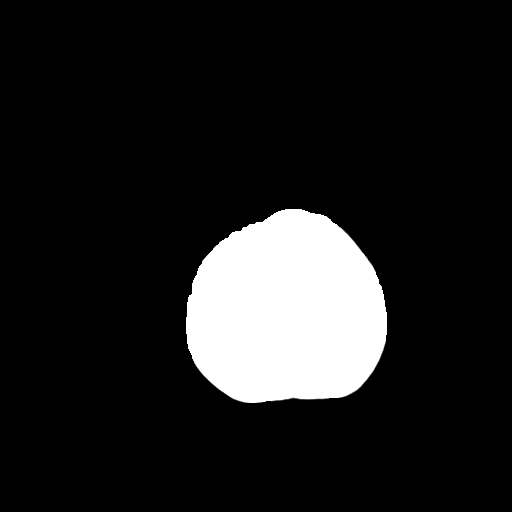
[im 32/36  brain]
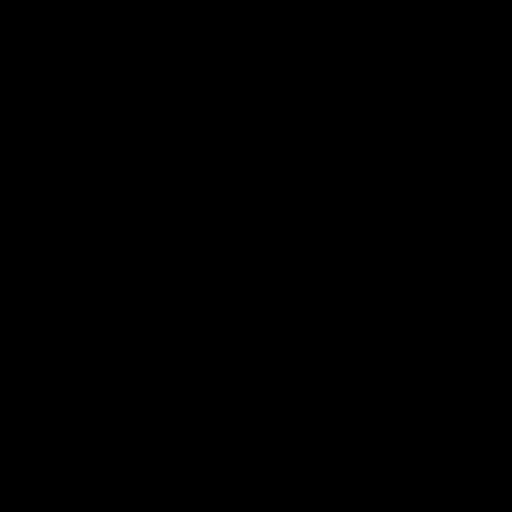
[im 34/36  brain]
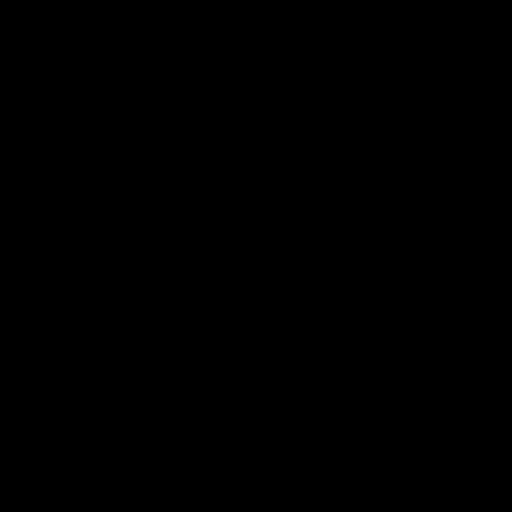

[16 of 30 positions shown; findings below may reference images not displayed]

FINDINGS: Left density in the peripheral right frontal lobe on
image 19 is felt to be related to signal averaging.  No mass
effect, midline shift, or acute intracranial hemorrhage.
Ventricles system is stable in appearance and size.  No new
suspicious area of low density to suggest interval acute ischemic
change.  Minimal chronic ischemic changes in the periventricular
white matter.  Mastoid air cells and visualized paranasal sinuses
are clear.
IMPRESSION: No acute intracranial pathology.

## 2014-01-12 IMAGING — CR DG CHEST 1V PORT
1 series · 1 of 1 positions shown · non-contrast
Comparison: 05/30/2012

CLINICAL DATA: Short of breath ET tube placement

PORTABLE CHEST - 1 VIEW

[view not recorded]
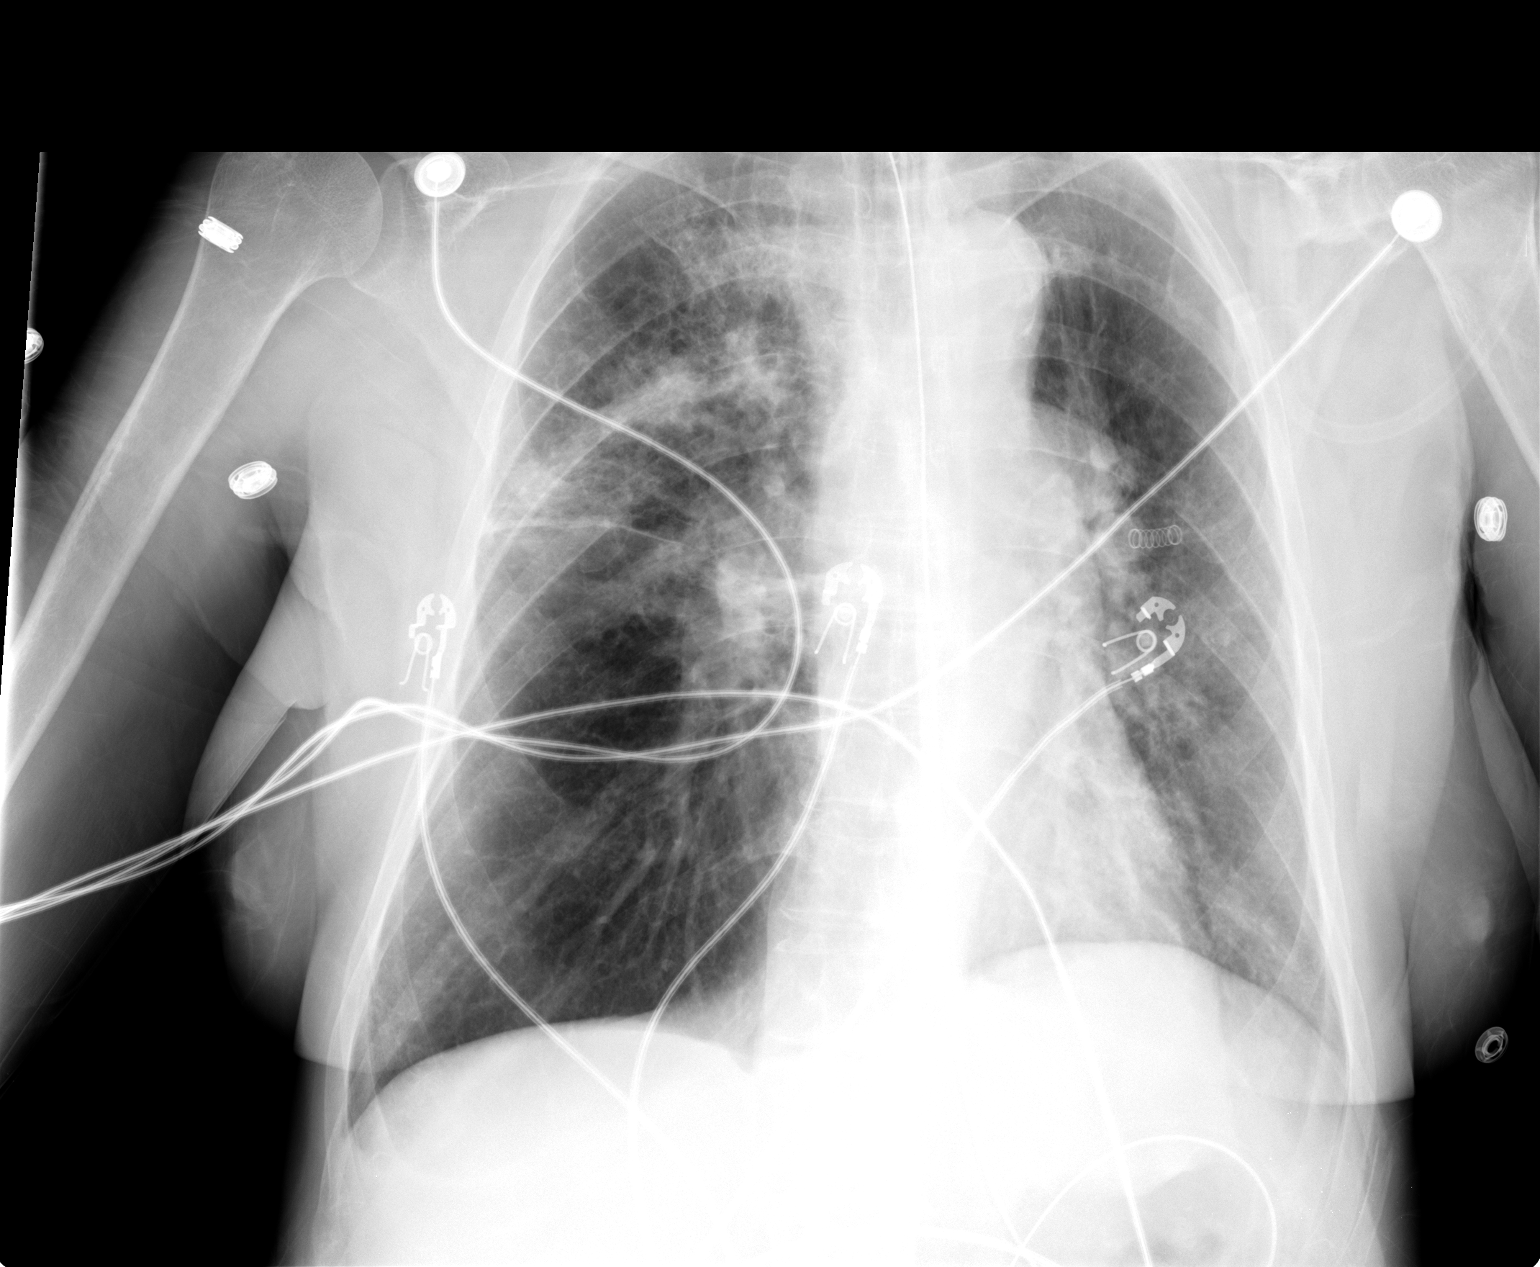

[1 of 1 positions shown; findings below may reference images not displayed]

FINDINGS: Endotracheal tube tip is above the carina.  There is a
nasogastric tube which is looped in the stomach.  Heart size is
normal.  Airspace consolidation is identified within the right
upper lobe.

The left lung is clear.  Visualized osseous structures are
unremarkable.
IMPRESSION: 1.  Right upper lobe airspace disease consistent with pneumonia.
2.  Advise follow-up imaging to ensure resolution.
3.  Satisfactory position of nasogastric tube and ET tube.

## 2014-07-12 DEATH — deceased
